# Patient Record
Sex: Female | Born: 1990 | Hispanic: No | Marital: Married | Smoking: Never smoker
Health system: Southern US, Community
[De-identification: ages and names within clinical notes are randomized; demographics above are authoritative.]

## PROBLEM LIST (undated history)

## (undated) DIAGNOSIS — D649 Anemia, unspecified: Secondary | ICD-10-CM

## (undated) DIAGNOSIS — Z789 Other specified health status: Secondary | ICD-10-CM

## (undated) DIAGNOSIS — A749 Chlamydial infection, unspecified: Secondary | ICD-10-CM

## (undated) DIAGNOSIS — Z8489 Family history of other specified conditions: Secondary | ICD-10-CM

## (undated) DIAGNOSIS — E119 Type 2 diabetes mellitus without complications: Secondary | ICD-10-CM

## (undated) DIAGNOSIS — D134 Benign neoplasm of liver: Secondary | ICD-10-CM

## (undated) DIAGNOSIS — N750 Cyst of Bartholin's gland: Secondary | ICD-10-CM

## (undated) HISTORY — DX: Benign neoplasm of liver: D13.4

---
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
## 2011-01-06 DIAGNOSIS — A749 Chlamydial infection, unspecified: Secondary | ICD-10-CM

## 2011-01-06 HISTORY — DX: Chlamydial infection, unspecified: A74.9

## 2011-02-11 ENCOUNTER — Inpatient Hospital Stay (HOSPITAL_COMMUNITY): Payer: Medicaid Other

## 2011-02-11 ENCOUNTER — Encounter (HOSPITAL_COMMUNITY): Payer: Self-pay

## 2011-02-11 ENCOUNTER — Encounter (HOSPITAL_COMMUNITY): Payer: Self-pay | Admitting: Anesthesiology

## 2011-02-11 ENCOUNTER — Inpatient Hospital Stay (HOSPITAL_COMMUNITY)
Admission: RE | Admit: 2011-02-11 | Discharge: 2011-02-14 | DRG: 774 | Disposition: A | Discharge status: Home / Self Care | Payer: Medicaid Other | Source: Ambulatory Visit | Attending: Obstetrics and Gynecology | Admitting: Obstetrics and Gynecology

## 2011-02-11 ENCOUNTER — Inpatient Hospital Stay (HOSPITAL_COMMUNITY): Payer: Medicaid Other | Admitting: Anesthesiology

## 2011-02-11 DIAGNOSIS — O98319 Other infections with a predominantly sexual mode of transmission complicating pregnancy, unspecified trimester: Secondary | ICD-10-CM

## 2011-02-11 DIAGNOSIS — O99892 Other specified diseases and conditions complicating childbirth: Secondary | ICD-10-CM | POA: Diagnosis present

## 2011-02-11 DIAGNOSIS — O9903 Anemia complicating the puerperium: Secondary | ICD-10-CM | POA: Diagnosis not present

## 2011-02-11 DIAGNOSIS — D649 Anemia, unspecified: Secondary | ICD-10-CM | POA: Diagnosis not present

## 2011-02-11 DIAGNOSIS — O411090 Infection of amniotic sac and membranes, unspecified, unspecified trimester, not applicable or unspecified: Secondary | ICD-10-CM | POA: Diagnosis present

## 2011-02-11 DIAGNOSIS — IMO0001 Reserved for inherently not codable concepts without codable children: Secondary | ICD-10-CM

## 2011-02-11 DIAGNOSIS — A568 Sexually transmitted chlamydial infection of other sites: Secondary | ICD-10-CM

## 2011-02-11 DIAGNOSIS — O99820 Streptococcus B carrier state complicating pregnancy: Secondary | ICD-10-CM

## 2011-02-11 DIAGNOSIS — O364XX0 Maternal care for intrauterine death, not applicable or unspecified: Principal | ICD-10-CM | POA: Diagnosis present

## 2011-02-11 DIAGNOSIS — O411290 Chorioamnionitis, unspecified trimester, not applicable or unspecified: Secondary | ICD-10-CM | POA: Diagnosis present

## 2011-02-11 DIAGNOSIS — Z22330 Carrier of Group B streptococcus: Secondary | ICD-10-CM

## 2011-02-11 DIAGNOSIS — IMO0002 Reserved for concepts with insufficient information to code with codable children: Secondary | ICD-10-CM | POA: Diagnosis present

## 2011-02-11 HISTORY — DX: Chlamydial infection, unspecified: A74.9

## 2011-02-11 HISTORY — DX: Cyst of Bartholin's gland: N75.0

## 2011-02-11 HISTORY — DX: Other specified health status: Z78.9

## 2011-02-11 LAB — DIFFERENTIAL
Basophils Relative: 0 % (ref 0–1)
Eosinophils Relative: 0 % (ref 0–5)
Lymphocytes Relative: 3 % — ABNORMAL LOW (ref 12–46)
Monocytes Relative: 7 % (ref 3–12)
Neutrophils Relative %: 90 % — ABNORMAL HIGH (ref 43–77)

## 2011-02-11 LAB — CBC
Hemoglobin: 9.9 g/dL — ABNORMAL LOW (ref 12.0–15.0)
Platelets: 192 10*3/uL (ref 150–400)
RBC: 3.93 MIL/uL (ref 3.87–5.11)
WBC: 20.2 10*3/uL — ABNORMAL HIGH (ref 4.0–10.5)

## 2011-02-11 LAB — COMPREHENSIVE METABOLIC PANEL
ALT: 11 U/L (ref 0–35)
Albumin: 2.8 g/dL — ABNORMAL LOW (ref 3.5–5.2)
Alkaline Phosphatase: 226 U/L — ABNORMAL HIGH (ref 39–117)
BUN: 7 mg/dL (ref 6–23)
Chloride: 102 mEq/L (ref 96–112)
GFR calc Af Amer: >90 mL/min (ref >90)
Glucose, Bld: 131 mg/dL — ABNORMAL HIGH (ref 70–99)
Potassium: 3.6 mEq/L (ref 3.5–5.1)
Sodium: 132 mEq/L — ABNORMAL LOW (ref 135–145)
Total Bilirubin: 0.3 mg/dL (ref 0.3–1.2)
Total Protein: 6.8 g/dL (ref 6.0–8.3)

## 2011-02-11 LAB — DIC (DISSEMINATED INTRAVASCULAR COAGULATION)PANEL
D-Dimer, Quant: 2.24 ug/mL-FEU — ABNORMAL HIGH (ref 0.00–0.48)
Fibrinogen: 619 mg/dL — ABNORMAL HIGH (ref 204–475)
INR: 1.15 (ref 0.00–1.49)
Platelets: 192 10*3/uL (ref 150–400)
Prothrombin Time: 14.9 seconds (ref 11.6–15.2)

## 2011-02-11 LAB — URIC ACID: Uric Acid, Serum: 4.3 mg/dL (ref 2.4–7.0)

## 2011-02-11 MED ORDER — PHENYLEPHRINE 40 MCG/ML (10ML) SYRINGE FOR IV PUSH (FOR BLOOD PRESSURE SUPPORT)
80.0000 ug | PREFILLED_SYRINGE | INTRAVENOUS | Status: DC | PRN
  Filled 2011-02-11: qty 5

## 2011-02-11 MED ORDER — IBUPROFEN 600 MG PO TABS: 600.0000 mg | ORAL_TABLET | Freq: Four times a day (QID) | ORAL | Status: DC | PRN

## 2011-02-11 MED ORDER — LACTATED RINGERS IV SOLN: 500.0000 mL | INTRAVENOUS | Status: DC | PRN

## 2011-02-11 MED ORDER — OXYTOCIN 20 UNITS IN LACTATED RINGERS INFUSION - SIMPLE
125.0000 mL/h | Freq: Once | INTRAVENOUS | Status: AC
  Administered 2011-02-12: 125 mL/h via INTRAVENOUS

## 2011-02-11 MED ORDER — OXYTOCIN BOLUS FROM INFUSION
500.0000 mL | Freq: Once | INTRAVENOUS | Status: DC
  Filled 2011-02-11: qty 1000
  Filled 2011-02-11: qty 500

## 2011-02-11 MED ORDER — OXYCODONE-ACETAMINOPHEN 5-325 MG PO TABS: 2.0000 | ORAL_TABLET | ORAL | Status: DC | PRN

## 2011-02-11 MED ORDER — BUTORPHANOL TARTRATE 2 MG/ML IJ SOLN: 1.0000 mg | INTRAMUSCULAR | Status: DC | PRN

## 2011-02-11 MED ORDER — ACETAMINOPHEN 325 MG PO TABS
650.0000 mg | ORAL_TABLET | ORAL | Status: DC | PRN
  Administered 2011-02-12: 650 mg via ORAL
  Filled 2011-02-11: qty 2

## 2011-02-11 MED ORDER — DIPHENHYDRAMINE HCL 50 MG/ML IJ SOLN: 12.5000 mg | INTRAMUSCULAR | Status: DC | PRN

## 2011-02-11 MED ORDER — LACTATED RINGERS IV SOLN
INTRAVENOUS | Status: DC
  Administered 2011-02-11 – 2011-02-12 (×4): via INTRAVENOUS

## 2011-02-11 MED ORDER — ONDANSETRON HCL 4 MG/2ML IJ SOLN: 4.0000 mg | Freq: Four times a day (QID) | INTRAMUSCULAR | Status: DC | PRN

## 2011-02-11 MED ORDER — LIDOCAINE HCL 1.5 % IJ SOLN
INTRAMUSCULAR | Status: DC | PRN
  Administered 2011-02-11: 2 mL via INTRADERMAL
  Administered 2011-02-11 (×2): 5 mL via INTRADERMAL

## 2011-02-11 MED ORDER — PHENYLEPHRINE 40 MCG/ML (10ML) SYRINGE FOR IV PUSH (FOR BLOOD PRESSURE SUPPORT): 80.0000 ug | PREFILLED_SYRINGE | INTRAVENOUS | Status: DC | PRN

## 2011-02-11 MED ORDER — EPHEDRINE 5 MG/ML INJ: 10.0000 mg | INTRAVENOUS | Status: DC | PRN

## 2011-02-11 MED ORDER — EPHEDRINE 5 MG/ML INJ
10.0000 mg | INTRAVENOUS | Status: DC | PRN
  Filled 2011-02-11: qty 4

## 2011-02-11 MED ORDER — FLEET ENEMA 7-19 GM/118ML RE ENEM: 1.0000 | ENEMA | RECTAL | Status: DC | PRN

## 2011-02-11 MED ORDER — LACTATED RINGERS IV SOLN: 500.0000 mL | Freq: Once | INTRAVENOUS | Status: DC

## 2011-02-11 MED ORDER — FENTANYL 2.5 MCG/ML BUPIVACAINE 1/10 % EPIDURAL INFUSION (WH - ANES)
14.0000 mL/h | INTRAMUSCULAR | Status: DC
  Administered 2011-02-11 – 2011-02-12 (×2): 14 mL/h via EPIDURAL
  Filled 2011-02-11 (×2): qty 60

## 2011-02-11 MED ORDER — LIDOCAINE HCL (PF) 1 % IJ SOLN
30.0000 mL | INTRAMUSCULAR | Status: DC | PRN
  Administered 2011-02-12: 30 mL via SUBCUTANEOUS
  Filled 2011-02-11: qty 30

## 2011-02-11 MED ORDER — CITRIC ACID-SODIUM CITRATE 334-500 MG/5ML PO SOLN: 30.0000 mL | ORAL | Status: DC | PRN

## 2011-02-11 MED ORDER — LACTATED RINGERS IV SOLN: INTRAVENOUS | Status: DC

## 2011-02-11 NOTE — Progress Notes (Signed)
  Subjective: Now on Birthing Suite--family and partner in room.  Had been requested to come to discuss questions regarding status and possible epidural plan.  Patient getting more uncomfortable.  Objective: BP 147/90  Pulse 138  Temp(Src) 98.1 F (36.7 C) (Oral)  Resp 18  Ht 5\' 3"  (1.6 m)  Wt 88.451 kg (195 lb)  BMI 34.54 kg/m2  SpO2 99%   UCs q 3 min, mod.  Labs: Lab Results  Component Value Date   WBC 20.2* 02/11/2011   HGB 9.9* 02/11/2011   HCT 30.7* 02/11/2011   MCV 78.1 02/11/2011   PLT 192 02/11/2011   PLT 192 02/11/2011    Assessment / Plan: IUFD at term, SROM, labor  Reviewed circumstances and questions with family.  Discussed epidural process.  Patient does wish to proceed with epidural.  Leeyah Heather 02/11/2011, 10:11 PM

## 2011-02-11 NOTE — Progress Notes (Signed)
2005 Report called to [facility] in [place]. Pt transported to 161 via w/c after going to [facility].

## 2011-02-11 NOTE — ED Notes (Signed)
Melanie Hoover PA into see patient portable ultrasound no fhr, ultrasound called to perform bedside US

## 2011-02-11 NOTE — H&P (Signed)
Melanie Mccoy is a 20 y.o. female, G1P0, at 31 3/7 weeks, presenting for SROM at 3:50pm, clear fluid initially, then "brownish", with UCs q2-4 since.  Reports feeling last FM this am.  Had NST 02/02/11 for decreased FM, reactive.  Seen 02/07/11 for last visit at [facility], no complications noted.  In MAU, no FHTs were heard--STAT bedside US showed IUFD.  SROM was verified, with moderate MSF noted.  Cervix was 4 cm, 90%, vtx 0 station by Dr. Redmond Baseman exam.  Contracting q 3-5 min, moderate.  Patient is therefore to be admitted for labor.  Pregnancy remarkable for: Recent dx chlamydia, with treatment 01/17/11--no TOC yet Positive GBS Slight late to care at 15 weeks  History of current pregnancy: Entered care at 15 weeks.  Had normal quad screen.  Ultrasound for anatomy at 20 weeks, with normal findings.  Treated for UTI at 24 weeks, with subsequent positive culture of GBS.  Declined flu vaccine.  Positive chlamydia at 36 weeks, with treatment.  TOC scheduled for upcoming visit.  Had NST last week for decreased FM, reactive.  Had last visit 02/07/11 without issue.  Cervix was 2 cm, 70%, vtx 0 station.   History OB History    Grav Para Term Preterm Abortions TAB SAB Ect Mult Living   2              Past Medical History  Diagnosis Date  . No pertinent past medical history   Chlamydia 11/12.  Hx pilonidal cyst  No past surgical history on file.  I&D of pilonidal cyst 2005  Family History: family history is not on file. MGM sarcoidosis.  PGM breast  Social History:  reports that she has never smoked. She does not have any smokeless tobacco history on file. Her alcohol and drug histories not on file.  Single, FOB is Alphonsus Sias, not currently present with patient.  Patient is biracial, of the [place] faith.  High school educated, employed at [employer] part-time.    ROS:  Leaking MSF, contractions q 2-4 min.  Denies HA, visual symptoms, epigastric pain.  Dilation: 4 Effacement (%):  90 Station: 0 Exam by:: Dr Normand Sloop Blood pressure 129/71, pulse 129, temperature 99.6 F (37.6 C), temperature source Oral, resp. rate 16, SpO2 99.00%.  Exam Chest clear Heart RRR without murmur Abd gravid, NT, moderate contractions palpated Pelvic--leaking moderate MSF, cervix 4 cm, 90%, vtx, 0 station by Dr. Normand Sloop Ext WNL, DTR 2+ without clonus, 1+ edema  Physical Exam  Prenatal labs: ABO, Rh: A/Positive/-- (06/06 0000) Antibody:  Neg Rubella: Immune (06/06 0000) RPR: Nonreactive (06/06 0000)  HBsAg: Negative (06/06 0000)  HIV: Non-reactive (06/06 0000)  GBS: Positive (08/15 0000)  GC/chlamydia negative at NOB, chlamydia positive at 36 weeks. Quad screen negative Glucola WNL Hgb 11.9 at NOB/11 at  28 weeks.  Assessment/Plan: IUP at 40 3/7 weeks IUFD Active labor with SROM at 3:50pm, moderate MSF Positive GBS Recent chlamydia, with treatment 01/17/11  Plan: Admit to Birthing Suite per consult with Dr. Mable Fill saw the patient and family in MAU. Routine MD orders Pain medication prn. CBC, RPR, DIC panel, PIH labs, TORCH titers TOC urine for chlamydia Support to patient and family for loss Family desires autopsy  Nigel Bridgeman 02/11/2011, 7:48 PM

## 2011-02-11 NOTE — Anesthesia Preprocedure Evaluation (Signed)
Anesthesia Evaluation  Patient identified by MRN, date of birth, ID band Patient awake    Reviewed: Allergy & Precautions, H&P , NPO status , Patient's Chart, lab work & pertinent test results, reviewed documented beta blocker date and time   History of Anesthesia Complications Negative for: history of anesthetic complications  Airway Mallampati: II TM Distance: >3 FB Neck ROM: full    Dental  (+) Teeth Intact   Pulmonary neg pulmonary ROS,  clear to auscultation        Cardiovascular neg cardio ROS regular Normal    Neuro/Psych Negative Neurological ROS  Negative Psych ROS   GI/Hepatic negative GI ROS, Neg liver ROS,   Endo/Other  Negative Endocrine ROS  Renal/GU negative Renal ROS     Musculoskeletal   Abdominal   Peds  Hematology negative hematology ROS (+)   Anesthesia Other Findings   Reproductive/Obstetrics (+) Pregnancy (IUFD at 40 weeks)                           Anesthesia Physical Anesthesia Plan  ASA: II  Anesthesia Plan: Epidural   Post-op Pain Management:    Induction:   Airway Management Planned:   Additional Equipment:   Intra-op Plan:   Post-operative Plan:   Informed Consent: I have reviewed the patients History and Physical, chart, labs and discussed the procedure including the risks, benefits and alternatives for the proposed anesthesia with the patient or authorized representative who has indicated his/her understanding and acceptance.     Plan Discussed with:   Anesthesia Plan Comments:         Anesthesia Quick Evaluation

## 2011-02-11 NOTE — Anesthesia Procedure Notes (Signed)
Epidural Patient location during procedure: OB Start time: 02/11/2011 10:56 PM Reason for block: procedure for pain  Staffing Performed by: anesthesiologist   Preanesthetic Checklist Completed: patient identified, site marked, surgical consent, pre-op evaluation, timeout performed, IV checked, risks and benefits discussed and monitors and equipment checked  Epidural Patient position: sitting Prep: site prepped and draped and DuraPrep Patient monitoring: continuous pulse ox and blood pressure Approach: midline Injection technique: LOR air  Needle:  Needle type: Tuohy  Needle gauge: 17 G Needle length: 9 cm Needle insertion depth: 5 cm cm Catheter type: closed end flexible Catheter size: 19 Gauge Catheter at skin depth: 10 cm Test dose: negative  Assessment Events: blood not aspirated, injection not painful, no injection resistance, negative IV test and no paresthesia  Additional Notes Discussed risk of headache, infection, bleeding, nerve injury and failed or incomplete block.  Patient voices understanding and wishes to proceed.

## 2011-02-11 NOTE — ED Notes (Signed)
Offered emotional support to patient and her mother, bedside report given to Quintella Baton RN

## 2011-02-11 NOTE — ED Notes (Signed)
[place] into perform bedside ultrasound

## 2011-02-11 NOTE — ED Notes (Signed)
[place] CNM in to see pt

## 2011-02-11 NOTE — Progress Notes (Signed)
Melanie Mccoy   Subjective:pt complains of pain from contractions   Objective: BP 129/71  Pulse 129  Temp(Src) 99.6 F (37.6 C) (Oral)  Resp 16  SpO2 99%      FHT:  none UC:   regular, every 2-3 minutes SVE:   Dilation: 4 Effacement (%): 90 Station: 0 Exam by:: Dr Normand Sloop [name] CNM in with MD )  Labs: Lab Results  Component Value Date   WBC 20.2* 02/11/2011   HGB 9.9* 02/11/2011   HCT 30.7* 02/11/2011   MCV 78.1 02/11/2011   PLT 192 02/11/2011   PLT 192 02/11/2011    Assessment / Plan: IUFD with SROM unkown cause  Labor: Progressing normally Fetal Wellbeing:  na Anticipated MOD:  vaginal delivery.  pt may have IV pain meds or epidural for pain  Melanie Mccoy A 02/11/2011, 7:59 PM

## 2011-02-12 ENCOUNTER — Encounter (HOSPITAL_COMMUNITY): Payer: Self-pay | Admitting: *Deleted

## 2011-02-12 ENCOUNTER — Other Ambulatory Visit: Payer: Self-pay | Admitting: Obstetrics and Gynecology

## 2011-02-12 DIAGNOSIS — IMO0002 Reserved for concepts with insufficient information to code with codable children: Secondary | ICD-10-CM | POA: Diagnosis present

## 2011-02-12 DIAGNOSIS — O411290 Chorioamnionitis, unspecified trimester, not applicable or unspecified: Secondary | ICD-10-CM | POA: Diagnosis present

## 2011-02-12 DIAGNOSIS — IMO0001 Reserved for inherently not codable concepts without codable children: Secondary | ICD-10-CM

## 2011-02-12 LAB — CBC
Hemoglobin: 7.9 g/dL — ABNORMAL LOW (ref 12.0–15.0)
RBC: 3.09 MIL/uL — ABNORMAL LOW (ref 3.87–5.11)
WBC: 26.7 10*3/uL — ABNORMAL HIGH (ref 4.0–10.5)

## 2011-02-12 LAB — RAPID URINE DRUG SCREEN, HOSP PERFORMED
Amphetamines: NOT DETECTED
Benzodiazepines: NOT DETECTED
Cocaine: NOT DETECTED
Opiates: NOT DETECTED

## 2011-02-12 LAB — GC/CHLAMYDIA PROBE AMP, URINE: Chlamydia, Swab/Urine, PCR: NEGATIVE

## 2011-02-12 MED ORDER — FERROUS SULFATE 325 (65 FE) MG PO TABS
325.0000 mg | ORAL_TABLET | Freq: Two times a day (BID) | ORAL | Status: DC
Start: 2011-02-12 — End: 2011-02-12
  Administered 2011-02-12: 325 mg via ORAL
  Filled 2011-02-12: qty 1

## 2011-02-12 MED ORDER — FERROUS SULFATE 300 (60 FE) MG/5ML PO SYRP
300.0000 mg | ORAL_SOLUTION | Freq: Two times a day (BID) | ORAL | Status: DC
  Administered 2011-02-12 – 2011-02-14 (×5): 300 mg via ORAL
  Filled 2011-02-12 (×7): qty 5

## 2011-02-12 MED ORDER — DIPHENHYDRAMINE HCL 25 MG PO CAPS: 25.0000 mg | ORAL_CAPSULE | Freq: Four times a day (QID) | ORAL | Status: DC | PRN

## 2011-02-12 MED ORDER — ZOLPIDEM TARTRATE 5 MG PO TABS: 5.0000 mg | ORAL_TABLET | Freq: Every evening | ORAL | Status: DC | PRN

## 2011-02-12 MED ORDER — DIBUCAINE 1 % RE OINT: 1.0000 "application " | TOPICAL_OINTMENT | RECTAL | Status: DC | PRN

## 2011-02-12 MED ORDER — SENNOSIDES-DOCUSATE SODIUM 8.6-50 MG PO TABS
2.0000 | ORAL_TABLET | Freq: Every day | ORAL | Status: DC
  Administered 2011-02-12 – 2011-02-13 (×2): 2 via ORAL

## 2011-02-12 MED ORDER — SODIUM CHLORIDE 0.9 % IV SOLN
3.0000 g | Freq: Four times a day (QID) | INTRAVENOUS | Status: AC
  Administered 2011-02-12 – 2011-02-13 (×4): 3 g via INTRAVENOUS
  Filled 2011-02-12 (×4): qty 3

## 2011-02-12 MED ORDER — PRENATAL PLUS 27-1 MG PO TABS: 1.0000 | ORAL_TABLET | Freq: Every day | ORAL | Status: DC

## 2011-02-12 MED ORDER — ONDANSETRON HCL 4 MG/2ML IJ SOLN: 4.0000 mg | INTRAMUSCULAR | Status: DC | PRN

## 2011-02-12 MED ORDER — ONDANSETRON HCL 4 MG PO TABS: 4.0000 mg | ORAL_TABLET | ORAL | Status: DC | PRN

## 2011-02-12 MED ORDER — WITCH HAZEL-GLYCERIN EX PADS: 1.0000 "application " | MEDICATED_PAD | CUTANEOUS | Status: DC | PRN

## 2011-02-12 MED ORDER — IBUPROFEN 600 MG PO TABS: 600.0000 mg | ORAL_TABLET | Freq: Four times a day (QID) | ORAL | Status: DC

## 2011-02-12 MED ORDER — TETANUS-DIPHTH-ACELL PERTUSSIS 5-2.5-18.5 LF-MCG/0.5 IM SUSP
0.5000 mL | Freq: Once | INTRAMUSCULAR | Status: DC
  Filled 2011-02-12: qty 0.5

## 2011-02-12 MED ORDER — OXYCODONE-ACETAMINOPHEN 5-325 MG PO TABS: 1.0000 | ORAL_TABLET | ORAL | Status: DC | PRN

## 2011-02-12 MED ORDER — BENZOCAINE-MENTHOL 20-0.5 % EX AERO
INHALATION_SPRAY | CUTANEOUS | Status: AC
  Administered 2011-02-12: 08:00:00
  Filled 2011-02-12: qty 56

## 2011-02-12 MED ORDER — LANOLIN HYDROUS EX OINT: TOPICAL_OINTMENT | CUTANEOUS | Status: DC | PRN

## 2011-02-12 MED ORDER — SIMETHICONE 80 MG PO CHEW: 80.0000 mg | CHEWABLE_TABLET | ORAL | Status: DC | PRN

## 2011-02-12 MED ORDER — COMPLETENATE 29-1 MG PO CHEW
1.0000 | CHEWABLE_TABLET | Freq: Every day | ORAL | Status: DC
  Administered 2011-02-12 – 2011-02-14 (×3): 1 via ORAL
  Filled 2011-02-12 (×4): qty 1

## 2011-02-12 MED ORDER — BENZOCAINE-MENTHOL 20-0.5 % EX AERO
1.0000 "application " | INHALATION_SPRAY | CUTANEOUS | Status: DC | PRN
  Administered 2011-02-12: 1 via TOPICAL

## 2011-02-12 MED ORDER — IBUPROFEN 100 MG/5ML PO SUSP
600.0000 mg | Freq: Four times a day (QID) | ORAL | Status: DC
  Administered 2011-02-12 – 2011-02-14 (×9): 600 mg via ORAL
  Filled 2011-02-12 (×14): qty 30

## 2011-02-12 NOTE — Anesthesia Postprocedure Evaluation (Signed)
  Anesthesia Post-op Note  Patient: Melanie Mccoy  Procedure(s) Performed: * No procedures listed *  Patient Location: Women's Unit  Anesthesia Type: Epidural  Level of Consciousness: awake  Airway and Oxygen Therapy: Patient Spontanous Breathing  Post-op Pain: mild  Post-op Assessment: Patient's Cardiovascular Status Stable, Respiratory Function Stable, Patent Airway, No signs of Nausea or vomiting and Pain level controlled  Post-op Vital Signs: stable  Complications: No apparent anesthesia complications
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
## 2011-02-14 LAB — DIFFERENTIAL
Eosinophils Absolute: 0.1 10*3/uL (ref 0.0–0.7)
Eosinophils Relative: 1 % (ref 0–5)
Lymphs Abs: 2.1 10*3/uL (ref 0.7–4.0)
Monocytes Absolute: 0.8 10*3/uL (ref 0.1–1.0)
Monocytes Relative: 5 % (ref 3–12)

## 2011-02-14 LAB — GC/CHLAMYDIA PROBE AMP, GENITAL: Chlamydia, DNA Probe: NEGATIVE

## 2011-02-14 LAB — CBC
Hemoglobin: 7.2 g/dL — ABNORMAL LOW (ref 12.0–15.0)
MCH: 25.1 pg — ABNORMAL LOW (ref 26.0–34.0)
MCV: 79.8 fL (ref 78.0–100.0)
RBC: 2.87 MIL/uL — ABNORMAL LOW (ref 3.87–5.11)

## 2011-02-14 LAB — LUPUS ANTICOAGULANT PANEL
PTT Lupus Anticoagulant: 46.8 secs — ABNORMAL HIGH (ref 28.0–43.0)
PTTLA Confirmation: 11.7 secs — ABNORMAL HIGH (ref <8.0)

## 2011-02-14 MED ORDER — IBUPROFEN 100 MG/5ML PO SUSP: 600.0000 mg | Freq: Four times a day (QID) | ORAL | Status: DC

## 2011-02-14 NOTE — Progress Notes (Signed)
Spiritual Care - Provided support to patient and her mother after full term loss.  Patient's mother is assisting her in plans for [employer] and burial.  [facility] will assist.  Patient's mother asked for information regarding options for service and we discussed at length.  Also, referred her to the pamphlet, "When Hello Means Goodbye" which contains information regarding services.    Reviewed other resources available through the [employer] program and [employer].  Patient has [employer] bereavement packet.  Assured them of availability to offer further assistance as needed.  Dory Horn, Chaplain

## 2011-02-14 NOTE — Discharge Summary (Signed)
Obstetric Discharge Summary Reason for Admission: onset of labor, IUFD Prenatal Procedures: NST and ultrasound Intrapartum Procedures: spontaneous vaginal delivery Postpartum Procedures: none Complications-Operative and Postpartum: 2nd degree Hemoglobin  Date Value Range Status  02/14/2011 7.2* 12.0-15.0 (g/dL) Final     HCT  Date Value Range Status  02/14/2011 22.9* 36.0-46.0 (%) Final   Results for orders placed during the hospital encounter of 02/11/11 (from the past 72 hour(s))  CBC     Status: Abnormal   Collection Time   02/11/11  7:02 PM      Component Value Range Comment   WBC 20.2 (*) 4.0 - 10.5 (K/uL)    RBC 3.93  3.87 - 5.11 (MIL/uL)    Hemoglobin 9.9 (*) 12.0 - 15.0 (g/dL)    HCT 40.9 (*) 81.1 - 46.0 (%)    MCV 78.1  78.0 - 100.0 (fL)    MCH 25.2 (*) 26.0 - 34.0 (pg)    MCHC 32.2  30.0 - 36.0 (g/dL)    RDW 91.4  78.2 - 95.6 (%)    Platelets 192  150 - 400 (K/uL)   DIFFERENTIAL     Status: Abnormal   Collection Time   02/11/11  7:02 PM      Component Value Range Comment   Neutrophils Relative 90 (*) 43 - 77 (%)    Lymphocytes Relative 3 (*) 12 - 46 (%)    Monocytes Relative 7  3 - 12 (%)    Eosinophils Relative 0  0 - 5 (%)    Basophils Relative 0  0 - 1 (%)    Neutro Abs 18.2 (*) 1.7 - 7.7 (K/uL)    Lymphs Abs 0.6 (*) 0.7 - 4.0 (K/uL)    Monocytes Absolute 1.4 (*) 0.1 - 1.0 (K/uL)    Eosinophils Absolute 0.0  0.0 - 0.7 (K/uL)    Basophils Absolute 0.0  0.0 - 0.1 (K/uL)   COMPREHENSIVE METABOLIC PANEL     Status: Abnormal   Collection Time   02/11/11  7:02 PM      Component Value Range Comment   Sodium 132 (*) 135 - 145 (mEq/L)    Potassium 3.6  3.5 - 5.1 (mEq/L)    Chloride 102  96 - 112 (mEq/L)    CO2 20  19 - 32 (mEq/L)    Glucose, Bld 131 (*) 70 - 99 (mg/dL)    BUN 7  6 - 23 (mg/dL)    Creatinine, Ser 2.13  0.50 - 1.10 (mg/dL)    Calcium 9.0  8.4 - 10.5 (mg/dL)    Total Protein 6.8  6.0 - 8.3 (g/dL)    Albumin 2.8 (*) 3.5 - 5.2 (g/dL)    AST 16  0  - 37 (U/L)    ALT 11  0 - 35 (U/L)    Alkaline Phosphatase 226 (*) 39 - 117 (U/L)    Total Bilirubin 0.3  0.3 - 1.2 (mg/dL)    GFR calc non Af Amer >90  >90 (mL/min)    GFR calc Af Amer >90  >90 (mL/min)   URIC ACID     Status: Normal   Collection Time   02/11/11  7:02 PM      Component Value Range Comment   Uric Acid, Serum 4.3  2.4 - 7.0 (mg/dL)   LACTATE DEHYDROGENASE     Status: Normal   Collection Time   02/11/11  7:02 PM      Component Value Range Comment   LD 189  94 -  250 (U/L)   RPR     Status: Normal   Collection Time   02/11/11  7:02 PM      Component Value Range Comment   RPR NON REACTIVE  NON REACTIVE    DIC PANEL     Status: Abnormal   Collection Time   02/11/11  7:02 PM      Component Value Range Comment   Prothrombin Time 14.9  11.6 - 15.2 (seconds)    INR 1.15  0.00 - 1.49     aPTT 33  24 - 37 (seconds)    Fibrinogen 619 (*) 204 - 475 (mg/dL)    D-Dimer, Quant 4.09 (*) 0.00 - 0.48 (ug/mL-FEU)    Platelets 192  150 - 400 (K/uL)    Smear Review NO SCHISTOCYTES SEEN     GC/CHLAMYDIA PROBE AMP, URINE     Status: Normal   Collection Time   02/11/11  8:42 PM      Component Value Range Comment   GC Probe Amp, Urine NEGATIVE  NEGATIVE     Chlamydia, Swab/Urine, PCR NEGATIVE  NEGATIVE    TISSUE CULTURE     Status: Normal (Preliminary result)   Collection Time   02/12/11  5:00 AM      Component Value Range Comment   Specimen Description PLACENTA      Special Requests Normal      Gram Stain        Value: MODERATE WBC PRESENT, PREDOMINANTLY PMN     NO SQUAMOUS EPITHELIAL CELLS SEEN     NO ORGANISMS SEEN   Culture        Value: RARE STAPHYLOCOCCUS SPECIES (COAGULASE NEGATIVE)     Note: RIFAMPIN AND GENTAMICIN SHOULD NOT BE USED AS SINGLE DRUGS FOR TREATMENT OF STAPH INFECTIONS.   Report Status PENDING     ANAEROBIC CULTURE     Status: Normal (Preliminary result)   Collection Time   02/12/11  5:10 AM      Component Value Range Comment   Specimen Description  PLACENTA      Special Requests Normal      Gram Stain        Value: NO WBC SEEN     NO SQUAMOUS EPITHELIAL CELLS SEEN     MODERATE GRAM NEGATIVE RODS   Culture        Value: NO ANAEROBES ISOLATED; CULTURE IN PROGRESS FOR 5 DAYS   Report Status PENDING     CBC     Status: Abnormal   Collection Time   02/12/11  6:30 AM      Component Value Range Comment   WBC 26.7 (*) 4.0 - 10.5 (K/uL)    RBC 3.09 (*) 3.87 - 5.11 (MIL/uL)    Hemoglobin 7.9 (*) 12.0 - 15.0 (g/dL)    HCT 81.1 (*) 91.4 - 46.0 (%)    MCV 77.7 (*) 78.0 - 100.0 (fL)    MCH 25.6 (*) 26.0 - 34.0 (pg)    MCHC 32.9  30.0 - 36.0 (g/dL)    RDW 78.2  95.6 - 21.3 (%)    Platelets 184  150 - 400 (K/uL)   URINE RAPID DRUG SCREEN (HOSP PERFORMED)     Status: Normal   Collection Time   02/12/11  2:00 PM      Component Value Range Comment   Opiates NONE DETECTED  NONE DETECTED     Cocaine NONE DETECTED  NONE DETECTED     Benzodiazepines NONE DETECTED  NONE DETECTED  Amphetamines NONE DETECTED  NONE DETECTED     Tetrahydrocannabinol NONE DETECTED  NONE DETECTED     Barbiturates NONE DETECTED  NONE DETECTED    ANA     Status: Normal   Collection Time   02/13/11  5:18 AM      Component Value Range Comment   ANA NEGATIVE  NEGATIVE    CBC     Status: Abnormal   Collection Time   02/14/11  6:49 AM      Component Value Range Comment   WBC 15.6 (*) 4.0 - 10.5 (K/uL)    RBC 2.87 (*) 3.87 - 5.11 (MIL/uL)    Hemoglobin 7.2 (*) 12.0 - 15.0 (g/dL)    HCT 16.1 (*) 09.6 - 46.0 (%)    MCV 79.8  78.0 - 100.0 (fL)    MCH 25.1 (*) 26.0 - 34.0 (pg)    MCHC 31.4  30.0 - 36.0 (g/dL)    RDW 04.5  40.9 - 81.1 (%)    Platelets 213  150 - 400 (K/uL)   DIFFERENTIAL     Status: Abnormal   Collection Time   02/14/11  6:49 AM      Component Value Range Comment   Neutrophils Relative 80 (*) 43 - 77 (%)    Neutro Abs 12.5 (*) 1.7 - 7.7 (K/uL)    Lymphocytes Relative 13  12 - 46 (%)    Lymphs Abs 2.1  0.7 - 4.0 (K/uL)    Monocytes Relative 5  3  - 12 (%)    Monocytes Absolute 0.8  0.1 - 1.0 (K/uL)    Eosinophils Relative 1  0 - 5 (%)    Eosinophils Absolute 0.1  0.0 - 0.7 (K/uL)    Basophils Relative 0  0 - 1 (%)    Basophils Absolute 0.1  0.0 - 0.1 (K/uL)    Hospital Course: Admitted in labor on 02/11/11, with SROM at 3:30pm.  Unable to find FHR on admission, with stat bedside US done, confirming IUFD.  Patient was 4 cm at arrival, with MSF noted.  Labored spontaneously, with epidural placed.  Ran low grade fever during labor, with max of 101 just before delivery.  See delivery note for further information.  No obvious anomalies noted, but chorioamnionitis suspected due to foul smell to fluid, fetus, and placenta.  Placenta to lab, autopsy planned.  All lab values for IUFD work-up were negative.  Leukocytosis noted pp, but was decreasing by day 2 pp.  Positive GBS.  Delivery was performed by Nigel Bridgeman, CNM, with Dr. Normand Sloop present, without complication. Patient tolerated the procedure without difficulty, with  2nd degree laceration noted.  Mother had an uncomplicated postpartum course, with anemia, but no hemodynamic instability. Mom's physical exam was WNL, and she was discharged home in stable condition. Contraception plan was undecided at present.  She received adequate benefit from po pain medications, and was taking liquid forms of all meds.  She had a social work consult, and good family support. I will see her in follow-up in 2-3 weeks.    Discharge Diagnoses: Term Pregnancy-delivered and IUFD, anemia without hemodynamic instability  Discharge Information: Date: 02/14/2011 Activity: pelvic rest and Per [facility] handout Diet: routine Medications: Ibuprofen and Iron (continue Floradix iron elixir) Condition: stable Instructions: refer to practice specific booklet Discharge to: home Follow-up Information    Follow up with Nigel Bridgeman, CNM in 3 weeks. (Call to make appointment with Chip Boer L. for 2-3 weeks, or as needed at [facility].)  Contact information:   [address] [place] [address] [phone]          Newborn Data: IUFD at term Birth Weight: 7 lb 12.5 oz (3530 g) APGAR: 0, 0   Gracy Ehly 02/14/2011, 11:57 AM

## 2011-02-14 NOTE — Progress Notes (Signed)
UR chart review completed.  

## 2011-02-14 NOTE — Progress Notes (Signed)
Post Partum Day 1 Subjective: up ad lib, voiding, tolerating PO, + flatus and VB lighter; No chills, sweats, or fever.  No dizziness when up.  Mother and grandmother at [facility].  Support given.  On po iron supplement.  Pt has finished 24 hrs of Unasyn IV.  RN does report confidentially pt has had no "alone time," and not seen pt cry any.  Objective: Blood pressure 135/84, pulse 99, temperature 97.7 F (36.5 C), temperature source Oral, resp. rate 18, height 5\' 3"  (1.6 m), weight 88.451 kg (195 lb), last menstrual period 05/04/2010, SpO2 97.00%.  Physical Exam:  General: alert, cooperative and no distress Lochia: appropriate, rubra Uterine Fundus: firm; u/-1 to -2 Incision: n/a DVT Evaluation: No evidence of DVT seen on physical exam. Negative Homan's sign. No significant calf/ankle edema.   Basename 02/14/11 0649 02/12/11 0630  HGB 7.2* 7.9*  HCT 22.9* 24.0*    Assessment/Plan: Plan for discharge tomorrow. Leukocytosis; Anemia w/o s/s.  S/p term IUFD.  HR tachycardic at times PP.  Afebrile. Clotting times WNL.  D-dimer and Fibrinogen elevated, but per c/w dr. Normand Sloop, can be elevated r/t to pregnancy. Other cultures and labs still pending. Will repeat CBC in AM.  Consider po Abx therapy if fever spikes.  Enc'd pt to continue adequate H2O. Support given r/e loss.    LOS: 3 days   Evie Croston H 02/14/2011, 10:20 AM

## 2011-02-15 LAB — TISSUE CULTURE

## 2011-02-19 LAB — ANAEROBIC CULTURE: Special Requests: NORMAL

## 2011-02-21 LAB — TORCH-IGM(TOXO/ RUB/ CMV/ HSV) W TITER
CMV IgM: 0.3
RPR Screen: NONREACTIVE
Rubella IgM Index: <0.9

## 2011-03-06 ENCOUNTER — Inpatient Hospital Stay (HOSPITAL_COMMUNITY): Admission: AD | Admit: 2011-03-06 | Payer: Self-pay | Source: Ambulatory Visit | Admitting: Obstetrics and Gynecology

## 2011-03-08 DEATH — deceased

## 2011-07-15 ENCOUNTER — Telehealth: Payer: Self-pay | Admitting: Obstetrics and Gynecology

## 2011-07-15 NOTE — Telephone Encounter (Signed)
Melanie Mccoy received

## 2011-07-21 ENCOUNTER — Telehealth: Payer: Self-pay

## 2011-07-21 NOTE — Telephone Encounter (Signed)
Spoke with pt c/o no relief with Loestrin spotting not subsiding & requests stronger BC med. Consulted with VL. Orthocyclen 1 po qd x 1 yr rf called to RA . Pt aware & agrees.

## 2012-05-25 ENCOUNTER — Other Ambulatory Visit: Payer: Self-pay | Admitting: Certified Nurse Midwife

## 2012-05-25 LAB — OB RESULTS CONSOLE HIV ANTIBODY (ROUTINE TESTING): HIV: NONREACTIVE

## 2012-05-27 LAB — CULTURE, OB URINE: Organism ID, Bacteria: NO GROWTH

## 2012-05-28 LAB — PRENATAL PANEL VII
Antibody Screen: NEGATIVE
Basophils Relative: 1 % (ref 0–1)
Eosinophils Absolute: 0.1 10*3/uL (ref 0.0–0.7)
Eosinophils Relative: 1 % (ref 0–5)
Hemoglobin: 13.2 g/dL (ref 12.0–15.0)
Hepatitis B Surface Ag: NEGATIVE
MCH: 29.4 pg (ref 26.0–34.0)
MCHC: 34.3 g/dL (ref 30.0–36.0)
MCV: 85.7 fL (ref 78.0–100.0)
Monocytes Relative: 5 % (ref 3–12)
Neutrophils Relative %: 77 % (ref 43–77)
Rh Type: POSITIVE
Rubella: 17.9 Index — ABNORMAL HIGH (ref <0.90)

## 2012-05-28 LAB — GC/CHLAMYDIA PROBE AMP, URINE: Chlamydia, Swab/Urine, PCR: NEGATIVE

## 2012-05-29 LAB — HEMOGLOBINOPATHY EVALUATION
Hemoglobin Other: 0 %
Hgb A2 Quant: 2.6 % (ref 2.2–3.2)
Hgb A: 97.4 % (ref 96.8–97.8)

## 2012-09-26 ENCOUNTER — Encounter: Payer: Self-pay | Admitting: *Deleted

## 2012-09-26 ENCOUNTER — Encounter: Payer: BC Managed Care – PPO | Attending: Obstetrics and Gynecology | Admitting: *Deleted

## 2012-09-26 VITALS — Ht 63.0 in | Wt 196.5 lb

## 2012-09-26 DIAGNOSIS — O99810 Abnormal glucose complicating pregnancy: Secondary | ICD-10-CM

## 2012-09-26 DIAGNOSIS — Z713 Dietary counseling and surveillance: Secondary | ICD-10-CM | POA: Insufficient documentation

## 2012-09-26 NOTE — Patient Instructions (Signed)
Goals:  Check glucose levels per MD as instructed  Follow Gestational Diabetes Diet as instructed  Call for follow-up as needed    

## 2012-09-26 NOTE — Progress Notes (Signed)
  Patient was seen on 09-26-12 for Gestational Diabetes self-management class at [facility]. The following learning objectives were met by the patient during this course:   States the definition of Gestational Diabetes  States why dietary management is important in controlling blood glucose  Describes the effects each nutrient has on blood glucose levels  Demonstrates ability to create a balanced meal plan  Demonstrates carbohydrate counting   States when to check blood glucose levels  Demonstrates proper blood glucose monitoring techniques  States the effect of stress and exercise on blood glucose levels  States the importance of limiting caffeine and abstaining from alcohol and smoking  Blood glucose monitor given: [employer] Nano Lot # [id number] Exp: 12-04-13 Blood glucose reading: 96  Patient instructed to monitor glucose levels: FBS: 60 - <90 2 hour: <120  *Patient received handouts:  Nutrition Diabetes and Pregnancy  Carbohydrate Counting List  Patient will be seen for follow-up as needed.

## 2012-11-01 LAB — OB RESULTS CONSOLE RPR: RPR: NONREACTIVE

## 2012-11-21 ENCOUNTER — Inpatient Hospital Stay (HOSPITAL_COMMUNITY): Admission: RE | Admit: 2012-11-21 | Payer: Medicaid Other | Source: Ambulatory Visit

## 2012-11-21 ENCOUNTER — Encounter (HOSPITAL_COMMUNITY)
Admission: RE | Admit: 2012-11-21 | Discharge: 2012-11-21 | Disposition: A | Discharge status: Home / Self Care | Payer: BC Managed Care – PPO | Source: Ambulatory Visit | Attending: Obstetrics and Gynecology | Admitting: Obstetrics and Gynecology

## 2012-11-21 ENCOUNTER — Encounter (HOSPITAL_COMMUNITY): Payer: Self-pay

## 2012-11-21 HISTORY — DX: Type 2 diabetes mellitus without complications: E11.9

## 2012-11-21 HISTORY — DX: Family history of other specified conditions: Z84.89

## 2012-11-21 HISTORY — DX: Anemia, unspecified: D64.9

## 2012-11-21 LAB — CBC
MCH: 23.3 pg — ABNORMAL LOW (ref 26.0–34.0)
Platelets: 173 10*3/uL (ref 150–400)
RBC: 4.29 MIL/uL (ref 3.87–5.11)
WBC: 10.1 10*3/uL (ref 4.0–10.5)

## 2012-11-21 LAB — BASIC METABOLIC PANEL
CO2: 20 mEq/L (ref 19–32)
Calcium: 9.7 mg/dL (ref 8.4–10.5)
Chloride: 102 mEq/L (ref 96–112)
Sodium: 135 mEq/L (ref 135–145)

## 2012-11-21 LAB — RPR: RPR Ser Ql: NONREACTIVE

## 2012-11-21 NOTE — Patient Instructions (Signed)
20 Melanie Mccoy  11/21/2012   Your procedure is scheduled on:  November 22, 2012  Enter through the Main Entrance of [facility] at 1000 AM.  Pick up the phone at the desk and dial [phone].   Call this number if you have problems the morning of surgery: [phone]   Remember:   Do not eat food:After Midnight.  Do not drink clear liquids: after 7:00am  Take these medicines the morning of surgery with A SIP OF WATER: no medications    Do not wear jewelry, make-up or nail polish.  Do not wear lotions, powders, or perfumes. You may wear deodorant.  Do not shave 48 hours prior to surgery.  Do not bring valuables to the hospital.  [facility] is not   responsible for any belongings or valuables brought to the hospital.  Contacts, dentures or bridgework may not be worn into surgery.  Leave suitcase in the car. After surgery it may be brought to your room.  For patients admitted to the hospital, checkout time is 11:00 AM the day of              discharge.   Patients discharged the day of surgery will not be allowed to drive             home.  Name and phone number of your driver: Leonette Most [phone]  Special Instructions:   Shower using CHG 2 nights before surgery and the night before surgery.  If you shower the day of surgery use CHG.  Use special wash - you have one bottle of CHG for all showers.  You should use approximately 1/3 of the bottle for each shower.   Please read over the following fact sheets that you were given:   Skin to skin

## 2012-11-21 NOTE — OR Nursing (Signed)
Message left for Adrianne for Dr Estanislado Pandy to place pre op orders

## 2012-11-21 NOTE — H&P (Signed)
  Melanie Mccoy is a 22 y.o. female presenting for elective primary cesarean section at 39+4 weeks for GDM with macrosomia.  OB History   Grav Para Term Preterm Abortions TAB SAB Ect Mult Living   1 1             Past Medical History  Diagnosis Date  . No pertinent past medical history   . Bartholin cyst   . Chlamydia 01/2011   Past surgical history: I&D pilonidal cyst 2005 Family History: PGM with breast cancer  MGM with sarcoidosis Social History:  reports that she has never smoked. She has never used smokeless tobacco. She reports that she does not drink alcohol or use illicit drugs.   Prenatal labs: ABO, Rh: A+ Antibody: Negative Rubella: immune RPR: Nonreactive (08/28 0000)  HBsAg: Negative (03/21 0000)  HIV: Non-reactive (03/21 0000)  GBS: Negative (08/28 0000)    Prenatal Transfer Tool  Maternal Diabetes: Yes:  Diabetes Type:  Diet controlled Genetic Screening: Declined Maternal Ultrasounds/Referrals: [facility] 11/20/12 9 lbs 8 oz  (4300 g) Fetal Ultrasounds or other Referrals:  None Maternal Substance Abuse:  No Significant Maternal Medications:  None Significant Maternal Lab Results: None    BP: 120/70    Weight:  206 lbs  General Appearance: Alert, appropriate appearance for age. No acute distress HEENT Exam: Grossly normal Chest/Respiratory Exam: Normal chest wall and respirations. Clear to auscultation  Cardiovascular Exam: Regular rate and rhythm. S1, S2, no murmur Gastrointestinal Exam: soft, non-tender, Uterus gravid with size compatible with GA measuring 40 cm, Vertex presentation by Leopold's maneuvers and ultrasound Psychiatric Exam: Alert and oriented, appropriate affect  ++++++++++++++++++++++++++++++++++++++++++++++++++++++++++++++++  Vaginal exam: deferred  ++++++++++++++++++++++++++++++++++++++++++++++++++++++++++++++++  Assessment/Plan:  IUP at 39+4 weeks with diet-controlled GDM but suspected macrosomia Proceed with elective primary cesarean  section due to elevated risks of shoulder dystocia   Cesarean section reviewed with pt with R&B including but not limited to:  bleeding, infection, injury to other organs. Low transverse approach planned which will allow vaginal delivery with future pregnancies. Should a vertical incision or inverted T be needed, patient is aware that repeat cesarean sections would be recommended in the future. Expected hospital stay and recovery also discussed.    Silverio Lay MD 11/21/2012, 1:46 PM

## 2012-11-21 NOTE — Progress Notes (Signed)
Dr Rodman Pickle made aware of hgb and fact that pt is a Jehovah's witness and refusing blood

## 2012-11-22 ENCOUNTER — Inpatient Hospital Stay (HOSPITAL_COMMUNITY)
Admission: AD | Admit: 2012-11-22 | Discharge: 2012-11-25 | DRG: 370 | Disposition: A | Discharge status: Home / Self Care | Payer: BC Managed Care – PPO | Source: Ambulatory Visit | Attending: Obstetrics and Gynecology | Admitting: Obstetrics and Gynecology

## 2012-11-22 ENCOUNTER — Encounter (HOSPITAL_COMMUNITY): Payer: Self-pay | Admitting: Pharmacy Technician

## 2012-11-22 ENCOUNTER — Other Ambulatory Visit: Payer: Self-pay | Admitting: Obstetrics and Gynecology

## 2012-11-22 ENCOUNTER — Encounter (HOSPITAL_COMMUNITY)
Admission: AD | Disposition: A | Discharge status: Home / Self Care | Payer: Self-pay | Source: Ambulatory Visit | Attending: Obstetrics and Gynecology

## 2012-11-22 ENCOUNTER — Encounter (HOSPITAL_COMMUNITY): Payer: Self-pay | Admitting: *Deleted

## 2012-11-22 ENCOUNTER — Inpatient Hospital Stay (HOSPITAL_COMMUNITY): Payer: BC Managed Care – PPO | Admitting: Anesthesiology

## 2012-11-22 ENCOUNTER — Encounter (HOSPITAL_COMMUNITY): Payer: Self-pay | Admitting: Anesthesiology

## 2012-11-22 DIAGNOSIS — D649 Anemia, unspecified: Secondary | ICD-10-CM | POA: Diagnosis not present

## 2012-11-22 DIAGNOSIS — O9903 Anemia complicating the puerperium: Secondary | ICD-10-CM | POA: Diagnosis not present

## 2012-11-22 DIAGNOSIS — Z98891 History of uterine scar from previous surgery: Secondary | ICD-10-CM

## 2012-11-22 DIAGNOSIS — O3660X0 Maternal care for excessive fetal growth, unspecified trimester, not applicable or unspecified: Principal | ICD-10-CM | POA: Diagnosis present

## 2012-11-22 DIAGNOSIS — O99814 Abnormal glucose complicating childbirth: Secondary | ICD-10-CM | POA: Diagnosis present

## 2012-11-22 DIAGNOSIS — O09293 Supervision of pregnancy with other poor reproductive or obstetric history, third trimester: Secondary | ICD-10-CM

## 2012-11-22 DIAGNOSIS — O24419 Gestational diabetes mellitus in pregnancy, unspecified control: Secondary | ICD-10-CM | POA: Diagnosis present

## 2012-11-22 LAB — GLUCOSE, CAPILLARY: Glucose-Capillary: 89 mg/dL (ref 70–99)

## 2012-11-22 SURGERY — Surgical Case
Anesthesia: Spinal | Site: Abdomen | Wound class: Clean Contaminated

## 2012-11-22 MED ORDER — PHENYLEPHRINE HCL 10 MG/ML IJ SOLN
INTRAMUSCULAR | Status: DC | PRN
  Administered 2012-11-22: 40 ug via INTRAVENOUS
  Administered 2012-11-22: 120 ug via INTRAVENOUS
  Administered 2012-11-22 (×3): 80 ug via INTRAVENOUS

## 2012-11-22 MED ORDER — DEXTROSE 5 % IV SOLN
1.0000 ug/kg/h | INTRAVENOUS | Status: DC | PRN
  Filled 2012-11-22: qty 2

## 2012-11-22 MED ORDER — KETOROLAC TROMETHAMINE 30 MG/ML IJ SOLN
30.0000 mg | Freq: Four times a day (QID) | INTRAMUSCULAR | Status: AC | PRN
  Administered 2012-11-22: 30 mg via INTRAMUSCULAR

## 2012-11-22 MED ORDER — MEPERIDINE HCL 25 MG/ML IJ SOLN: 6.2500 mg | INTRAMUSCULAR | Status: DC | PRN

## 2012-11-22 MED ORDER — OXYTOCIN 40 UNITS IN LACTATED RINGERS INFUSION - SIMPLE MED: 62.5000 mL/h | INTRAVENOUS | Status: AC

## 2012-11-22 MED ORDER — NALBUPHINE HCL 10 MG/ML IJ SOLN
5.0000 mg | INTRAMUSCULAR | Status: DC | PRN
  Filled 2012-11-22: qty 1

## 2012-11-22 MED ORDER — OXYCODONE-ACETAMINOPHEN 5-325 MG PO TABS
1.0000 | ORAL_TABLET | ORAL | Status: DC | PRN
  Administered 2012-11-23 – 2012-11-24 (×4): 1 via ORAL
  Filled 2012-11-22 (×4): qty 1

## 2012-11-22 MED ORDER — FENTANYL CITRATE 0.05 MG/ML IJ SOLN
INTRAMUSCULAR | Status: AC
  Filled 2012-11-22: qty 2

## 2012-11-22 MED ORDER — CEFAZOLIN SODIUM-DEXTROSE 2-3 GM-% IV SOLR
2.0000 g | Freq: Once | INTRAVENOUS | Status: AC
  Administered 2012-11-22: 2 g via INTRAVENOUS
  Filled 2012-11-22: qty 50

## 2012-11-22 MED ORDER — ZOLPIDEM TARTRATE 5 MG PO TABS: 5.0000 mg | ORAL_TABLET | Freq: Every evening | ORAL | Status: DC | PRN

## 2012-11-22 MED ORDER — LANOLIN HYDROUS EX OINT: 1.0000 "application " | TOPICAL_OINTMENT | CUTANEOUS | Status: DC | PRN

## 2012-11-22 MED ORDER — MORPHINE SULFATE 0.5 MG/ML IJ SOLN
INTRAMUSCULAR | Status: AC
  Filled 2012-11-22: qty 10

## 2012-11-22 MED ORDER — WITCH HAZEL-GLYCERIN EX PADS: 1.0000 "application " | MEDICATED_PAD | CUTANEOUS | Status: DC | PRN

## 2012-11-22 MED ORDER — DIPHENHYDRAMINE HCL 50 MG/ML IJ SOLN: 25.0000 mg | INTRAMUSCULAR | Status: DC | PRN

## 2012-11-22 MED ORDER — DIPHENHYDRAMINE HCL 25 MG PO CAPS: 25.0000 mg | ORAL_CAPSULE | Freq: Four times a day (QID) | ORAL | Status: DC | PRN

## 2012-11-22 MED ORDER — EPHEDRINE 5 MG/ML INJ
INTRAVENOUS | Status: AC
  Filled 2012-11-22: qty 10

## 2012-11-22 MED ORDER — ONDANSETRON HCL 4 MG/2ML IJ SOLN: 4.0000 mg | Freq: Three times a day (TID) | INTRAMUSCULAR | Status: DC | PRN

## 2012-11-22 MED ORDER — FENTANYL CITRATE 0.05 MG/ML IJ SOLN: 25.0000 ug | INTRAMUSCULAR | Status: DC | PRN

## 2012-11-22 MED ORDER — ONDANSETRON HCL 4 MG/2ML IJ SOLN: 4.0000 mg | INTRAMUSCULAR | Status: DC | PRN

## 2012-11-22 MED ORDER — BUPIVACAINE HCL (PF) 0.25 % IJ SOLN
INTRAMUSCULAR | Status: AC
  Filled 2012-11-22: qty 30

## 2012-11-22 MED ORDER — OXYTOCIN 10 UNIT/ML IJ SOLN
40.0000 [IU] | INTRAVENOUS | Status: DC | PRN
  Administered 2012-11-22: 40 [IU] via INTRAVENOUS

## 2012-11-22 MED ORDER — DEXTROSE IN LACTATED RINGERS 5 % IV SOLN
INTRAVENOUS | Status: DC
  Administered 2012-11-22 – 2012-11-23 (×2): via INTRAVENOUS

## 2012-11-22 MED ORDER — SIMETHICONE 80 MG PO CHEW
80.0000 mg | CHEWABLE_TABLET | ORAL | Status: DC
  Administered 2012-11-23 (×2): 80 mg via ORAL

## 2012-11-22 MED ORDER — MENTHOL 3 MG MT LOZG: 1.0000 | LOZENGE | OROMUCOSAL | Status: DC | PRN

## 2012-11-22 MED ORDER — ONDANSETRON HCL 4 MG PO TABS: 4.0000 mg | ORAL_TABLET | ORAL | Status: DC | PRN

## 2012-11-22 MED ORDER — BUPIVACAINE IN DEXTROSE 0.75-8.25 % IT SOLN
INTRATHECAL | Status: DC | PRN
  Administered 2012-11-22: 10.5 mg via INTRATHECAL

## 2012-11-22 MED ORDER — SCOPOLAMINE 1 MG/3DAYS TD PT72
1.0000 | MEDICATED_PATCH | Freq: Once | TRANSDERMAL | Status: DC
  Administered 2012-11-22: 1.5 mg via TRANSDERMAL

## 2012-11-22 MED ORDER — PRENATAL MULTIVITAMIN CH
1.0000 | ORAL_TABLET | Freq: Every day | ORAL | Status: DC
  Administered 2012-11-23 – 2012-11-25 (×3): 1 via ORAL
  Filled 2012-11-22 (×3): qty 1

## 2012-11-22 MED ORDER — BUPIVACAINE HCL (PF) 0.25 % IJ SOLN
INTRAMUSCULAR | Status: DC | PRN
  Administered 2012-11-22: 20 mL

## 2012-11-22 MED ORDER — SCOPOLAMINE 1 MG/3DAYS TD PT72: 1.0000 | MEDICATED_PATCH | Freq: Once | TRANSDERMAL | Status: DC

## 2012-11-22 MED ORDER — OXYTOCIN 10 UNIT/ML IJ SOLN
INTRAMUSCULAR | Status: AC
  Filled 2012-11-22: qty 4

## 2012-11-22 MED ORDER — KETOROLAC TROMETHAMINE 30 MG/ML IJ SOLN
INTRAMUSCULAR | Status: AC
  Administered 2012-11-22: 30 mg via INTRAMUSCULAR
  Filled 2012-11-22: qty 1

## 2012-11-22 MED ORDER — PHENYLEPHRINE 40 MCG/ML (10ML) SYRINGE FOR IV PUSH (FOR BLOOD PRESSURE SUPPORT)
PREFILLED_SYRINGE | INTRAVENOUS | Status: AC
  Filled 2012-11-22: qty 5

## 2012-11-22 MED ORDER — NALOXONE HCL 0.4 MG/ML IJ SOLN: 0.4000 mg | INTRAMUSCULAR | Status: DC | PRN

## 2012-11-22 MED ORDER — FERROUS SULFATE 325 (65 FE) MG PO TABS
325.0000 mg | ORAL_TABLET | Freq: Two times a day (BID) | ORAL | Status: DC
  Administered 2012-11-23 – 2012-11-25 (×5): 325 mg via ORAL
  Filled 2012-11-22 (×5): qty 1

## 2012-11-22 MED ORDER — FENTANYL CITRATE 0.05 MG/ML IJ SOLN
INTRAMUSCULAR | Status: DC | PRN
  Administered 2012-11-22: 15 ug via INTRATHECAL

## 2012-11-22 MED ORDER — ONDANSETRON HCL 4 MG/2ML IJ SOLN
INTRAMUSCULAR | Status: DC | PRN
  Administered 2012-11-22: 4 mg via INTRAVENOUS

## 2012-11-22 MED ORDER — PROMETHAZINE HCL 25 MG/ML IJ SOLN: 6.2500 mg | INTRAMUSCULAR | Status: DC | PRN

## 2012-11-22 MED ORDER — DIPHENHYDRAMINE HCL 25 MG PO CAPS
25.0000 mg | ORAL_CAPSULE | ORAL | Status: DC | PRN
  Filled 2012-11-22: qty 1

## 2012-11-22 MED ORDER — MIDAZOLAM HCL 2 MG/2ML IJ SOLN: 0.5000 mg | Freq: Once | INTRAMUSCULAR | Status: DC | PRN

## 2012-11-22 MED ORDER — TETANUS-DIPHTH-ACELL PERTUSSIS 5-2.5-18.5 LF-MCG/0.5 IM SUSP: 0.5000 mL | Freq: Once | INTRAMUSCULAR | Status: DC

## 2012-11-22 MED ORDER — METHYLERGONOVINE MALEATE 0.2 MG PO TABS: 0.2000 mg | ORAL_TABLET | ORAL | Status: DC | PRN

## 2012-11-22 MED ORDER — SIMETHICONE 80 MG PO CHEW
80.0000 mg | CHEWABLE_TABLET | Freq: Three times a day (TID) | ORAL | Status: DC
  Administered 2012-11-22 – 2012-11-25 (×8): 80 mg via ORAL

## 2012-11-22 MED ORDER — IBUPROFEN 600 MG PO TABS
600.0000 mg | ORAL_TABLET | Freq: Four times a day (QID) | ORAL | Status: DC
  Administered 2012-11-23 – 2012-11-25 (×10): 600 mg via ORAL
  Filled 2012-11-22 (×10): qty 1

## 2012-11-22 MED ORDER — CEFAZOLIN SODIUM-DEXTROSE 2-3 GM-% IV SOLR
INTRAVENOUS | Status: AC
  Filled 2012-11-22: qty 50

## 2012-11-22 MED ORDER — MORPHINE SULFATE (PF) 0.5 MG/ML IJ SOLN
INTRAMUSCULAR | Status: DC | PRN
  Administered 2012-11-22: 100 ug via INTRATHECAL

## 2012-11-22 MED ORDER — DIBUCAINE 1 % RE OINT: 1.0000 "application " | TOPICAL_OINTMENT | RECTAL | Status: DC | PRN

## 2012-11-22 MED ORDER — METOCLOPRAMIDE HCL 5 MG/ML IJ SOLN: 10.0000 mg | Freq: Three times a day (TID) | INTRAMUSCULAR | Status: DC | PRN

## 2012-11-22 MED ORDER — KETOROLAC TROMETHAMINE 30 MG/ML IJ SOLN
30.0000 mg | Freq: Four times a day (QID) | INTRAMUSCULAR | Status: AC | PRN
  Administered 2012-11-22: 30 mg via INTRAVENOUS
  Filled 2012-11-22: qty 1

## 2012-11-22 MED ORDER — SENNOSIDES-DOCUSATE SODIUM 8.6-50 MG PO TABS
2.0000 | ORAL_TABLET | ORAL | Status: DC
  Administered 2012-11-23 (×2): 2 via ORAL

## 2012-11-22 MED ORDER — SCOPOLAMINE 1 MG/3DAYS TD PT72
MEDICATED_PATCH | TRANSDERMAL | Status: AC
  Administered 2012-11-22: 1.5 mg via TRANSDERMAL
  Filled 2012-11-22: qty 1

## 2012-11-22 MED ORDER — SODIUM CHLORIDE 0.9 % IJ SOLN: 3.0000 mL | INTRAMUSCULAR | Status: DC | PRN

## 2012-11-22 MED ORDER — METHYLERGONOVINE MALEATE 0.2 MG/ML IJ SOLN: 0.2000 mg | INTRAMUSCULAR | Status: DC | PRN

## 2012-11-22 MED ORDER — LACTATED RINGERS IV SOLN
INTRAVENOUS | Status: DC
  Administered 2012-11-22 (×4): via INTRAVENOUS

## 2012-11-22 MED ORDER — ONDANSETRON HCL 4 MG/2ML IJ SOLN
INTRAMUSCULAR | Status: AC
  Filled 2012-11-22: qty 2

## 2012-11-22 MED ORDER — MEASLES, MUMPS & RUBELLA VAC ~~LOC~~ INJ
0.5000 mL | INJECTION | Freq: Once | SUBCUTANEOUS | Status: DC
  Filled 2012-11-22: qty 0.5

## 2012-11-22 MED ORDER — DIPHENHYDRAMINE HCL 50 MG/ML IJ SOLN: 12.5000 mg | INTRAMUSCULAR | Status: DC | PRN

## 2012-11-22 MED ORDER — SIMETHICONE 80 MG PO CHEW: 80.0000 mg | CHEWABLE_TABLET | ORAL | Status: DC | PRN

## 2012-11-22 SURGICAL SUPPLY — 38 items
BENZOIN TINCTURE PRP APPL 2/3 (GAUZE/BANDAGES/DRESSINGS) ×2 IMPLANT
BOOTIES KNEE HIGH SLOAN (MISCELLANEOUS) ×4 IMPLANT
CLAMP CORD UMBIL (MISCELLANEOUS) IMPLANT
CLEANER TIP ELECTROSURG 2X2 (MISCELLANEOUS) ×2 IMPLANT
CLOTH BEACON ORANGE TIMEOUT ST (SAFETY) ×2 IMPLANT
DEVICE BLD TRNS LUER ATTCH (MISCELLANEOUS) ×2 IMPLANT
DRAIN JACKSON PRT FLT 10 (DRAIN) IMPLANT
DRAPE LG THREE QUARTER DISP (DRAPES) ×4 IMPLANT
DRSG OPSITE POSTOP 4X10 (GAUZE/BANDAGES/DRESSINGS) ×2 IMPLANT
DURAPREP 26ML APPLICATOR (WOUND CARE) ×2 IMPLANT
ELECT REM PT RETURN 9FT ADLT (ELECTROSURGICAL) ×2
ELECTRODE REM PT RTRN 9FT ADLT (ELECTROSURGICAL) ×1 IMPLANT
EVACUATOR SILICONE 100CC (DRAIN) IMPLANT
EXTRACTOR VACUUM M CUP 4 TUBE (SUCTIONS) IMPLANT
GLOVE BIOGEL PI IND STRL 7.0 (GLOVE) ×1 IMPLANT
GLOVE BIOGEL PI INDICATOR 7.0 (GLOVE) ×1
GLOVE ECLIPSE 6.5 STRL STRAW (GLOVE) ×2 IMPLANT
GOWN PREVENTION PLUS XLARGE (GOWN DISPOSABLE) ×2 IMPLANT
GOWN STRL REIN XL XLG (GOWN DISPOSABLE) ×2 IMPLANT
KIT ABG SYR 3ML LUER SLIP (SYRINGE) IMPLANT
NEEDLE HYPO 22GX1.5 SAFETY (NEEDLE) ×2 IMPLANT
NEEDLE HYPO 25X5/8 SAFETYGLIDE (NEEDLE) IMPLANT
NS IRRIG 1000ML POUR BTL (IV SOLUTION) ×4 IMPLANT
PACK C SECTION WH (CUSTOM PROCEDURE TRAY) ×2 IMPLANT
PAD OB MATERNITY 4.3X12.25 (PERSONAL CARE ITEMS) ×2 IMPLANT
PENCIL BUTTON HOLSTER BLD 10FT (ELECTRODE) ×2 IMPLANT
RTRCTR C-SECT PINK 25CM LRG (MISCELLANEOUS) ×2 IMPLANT
STRIP CLOSURE SKIN 1/2X4 (GAUZE/BANDAGES/DRESSINGS) ×2 IMPLANT
SUT CHROMIC GUT AB #0 18 (SUTURE) IMPLANT
SUT MNCRL AB 3-0 PS2 27 (SUTURE) ×2 IMPLANT
SUT SILK 2 0 FSL 18 (SUTURE) IMPLANT
SUT VIC AB 0 CTX 36 (SUTURE) ×2
SUT VIC AB 0 CTX36XBRD ANBCTRL (SUTURE) ×2 IMPLANT
SUT VIC AB 1 CT1 36 (SUTURE) ×4 IMPLANT
SYR 20CC LL (SYRINGE) ×2 IMPLANT
TOWEL OR 17X24 6PK STRL BLUE (TOWEL DISPOSABLE) ×2 IMPLANT
TRAY FOLEY CATH 14FR (SET/KITS/TRAYS/PACK) ×2 IMPLANT
WATER STERILE IRR 1000ML POUR (IV SOLUTION) ×2 IMPLANT

## 2012-11-22 NOTE — Op Note (Signed)
Preoperative diagnosis: Intrauterine pregnancy at 39 weeks and 4 days , GDM, suspected macrosomia  Post operative diagnosis: Same  Anesthesia: Spinal  Anesthesiologist: Dr. Rodman Pickle  Procedure: Primary low transverse cesarean section  Surgeon: Dr. Dois Davenport Julien Berryman  Assistant: none  Estimated blood loss: 600 cc  Procedure:  After being informed of the planned procedure and possible complications including bleeding, infection, injury to other organs, informed consent is obtained. The patient is taken to OR #9 and given spinal anesthesia without complication. She is placed in the dorsal decubitus position with the pelvis tilted to the left. She is then prepped and draped in a sterile fashion. A Foley catheter is inserted in her bladder.  After assessing adequate level of anesthesia, we infiltrate the suprapubic area with 20 cc of Marcaine 0.25 and perform a Pfannenstiel incision which is brought down sharply to the fascia. The fascia is entered in a low transverse fashion. Linea alba is dissected. Peritoneum is entered in a midline fashion. An Alexis retractor is easily positioned.   The myometrium is then entered in a low transverse fashion, 2 cm above the vesico-uterine junction ; first with knife and then extended bluntly. Amniotic fluid is clear. We assist the birth of a female  infant in vertex presentation. Mouth and nose are suctioned. The baby is delivered. The cord is clamped and sectioned. The baby is given to the neonatologist present in the room.  10 cc of blood is drawn from the umbilical vein.The placenta is allowed to deliver spontaneously. It is complete and the cord has 3 vessels. Uterine revision is negative.  We proceed with closure of the myometrium in 2 layers: First with a running locked suture of 0 Vicryl, then with a Lembert suture of 0 Vicryl imbricating the first one. Hemostasis is completed with cauterization on peritoneal edges.  Both paracolic gutters are cleaned.  Both tubes and ovaries are assessed and normal. The pelvis is profusely irrigated with warm saline to confirm a satisfactory hemostasis.  Retractors and sponges are removed. Under fascia hemostasis is completed with cauterization. The fascia is then closed with 2 running sutures of 0 Vicryl meeting midline. The wound is irrigated with warm saline and hemostasis is completed with cauterization. The skin is closed with a subcuticular suture of 3-0 Monocryl and Steri-Strips.  Instrument and sponge count is complete x2. Estimated blood loss is 600 cc.  The procedure is well tolerated by the patient who is taken to recovery room in a well and stable condition.  female baby named [name] was born at 12:09 and received an Apgar of 8  at 1 minute and 9 at 5 minutes.    Specimen: Placenta sent to L & D then to Cord Blood donation   [facility] A MD 9/18/201412:53 PM

## 2012-11-22 NOTE — Consult Note (Signed)
Neonatology Note:   Attendance at C-section:    I was asked by Dr. Estanislado Pandy to attend this primary C/S at term due to fetal macrosomia. The mother is a G2P1 A pos, GBS neg with diet-controlled GDM. ROM at delivery, fluid clear. CAN times 2.  Infant vigorous with good spontaneous cry and tone. Needed only minimal bulb suctioning. Ap 8/9. Lungs clear to ausc in DR. To CN to care of Pediatrician.   Doretha Sou, MD

## 2012-11-22 NOTE — Anesthesia Postprocedure Evaluation (Signed)
  Anesthesia Post-op Note  Patient: Melanie Mccoy SAMS  Procedure(s) Performed: Procedure(s): PRIMARY CESAREAN SECTION (N/A)  Patient Location: Mother/Baby  Anesthesia Type:Spinal  Level of Consciousness: awake  Airway and Oxygen Therapy: Patient Spontanous Breathing  Post-op Pain: mild  Post-op Assessment: Patient's Cardiovascular Status Stable and Respiratory Function Stable  Post-op Vital Signs: stable  Complications: No apparent anesthesia complications

## 2012-11-22 NOTE — Interval H&P Note (Signed)
History and Physical Interval Note:  11/22/2012 11:13 AM  Melanie Mccoy  has presented today for surgery, with the diagnosis of Gestational Diabetes, suspected macromia; CPT [phone]  The various methods of treatment have been discussed with the patient and family. After consideration of risks, benefits and other options for treatment, the patient has consented to  Procedure(s) with comments: PRIMARY CESAREAN SECTION (N/A) - Patient does NOT accept Blood (no transfusions) as a surgical intervention .  The patient's history has been reviewed, patient examined, no change in status, stable for surgery.  I have reviewed the patient's chart and labs.  Questions were answered to the patient's satisfaction.     Tenzin Edelman A

## 2012-11-22 NOTE — Anesthesia Procedure Notes (Signed)
Spinal  Patient location during procedure: OR Start time: 11/22/2012 11:38 AM Staffing Performed by: anesthesiologist  Preanesthetic Checklist Completed: patient identified, site marked, surgical consent, pre-op evaluation, timeout performed, IV checked, risks and benefits discussed and monitors and equipment checked Spinal Block Patient position: sitting Prep: site prepped and draped and DuraPrep Patient monitoring: heart rate, cardiac monitor, continuous pulse ox and blood pressure Approach: midline Location: L3-4 Injection technique: single-shot Needle Needle type: Pencan  Needle gauge: 24 G Needle length: 9 cm Assessment Sensory level: T4 Additional Notes Clear free flow CSF on first attempt.  No paresthesia.  Patient tolerated procedure well with no apparent complications.  Jasmine December, MD

## 2012-11-22 NOTE — Anesthesia Preprocedure Evaluation (Signed)
Anesthesia Evaluation  Patient identified by MRN, date of birth, ID band Patient awake    Reviewed: Allergy & Precautions, H&P , NPO status , Patient's Chart, lab work & pertinent test results, reviewed documented beta blocker date and time   History of Anesthesia Complications Negative for: history of anesthetic complications  Airway Mallampati: III TM Distance: >3 FB Neck ROM: full    Dental  (+) Teeth Intact   Pulmonary neg pulmonary ROS,  breath sounds clear to auscultation        Cardiovascular negative cardio ROS  Rhythm:regular Rate:Normal     Neuro/Psych negative neurological ROS  negative psych ROS   GI/Hepatic negative GI ROS, Neg liver ROS,   Endo/Other  diabetes (diet controlled), GestationalBMI 36  Renal/GU negative Renal ROS  negative genitourinary   Musculoskeletal   Abdominal   Peds  Hematology  (+) anemia , REFUSES BLOOD PRODUCTS,   Anesthesia Other Findings   Reproductive/Obstetrics (+) Pregnancy                           Anesthesia Physical Anesthesia Plan  ASA: II  Anesthesia Plan: Spinal   Post-op Pain Management:    Induction:   Airway Management Planned:   Additional Equipment:   Intra-op Plan:   Post-operative Plan:   Informed Consent: I have reviewed the patients History and Physical, chart, labs and discussed the procedure including the risks, benefits and alternatives for the proposed anesthesia with the patient or authorized representative who has indicated his/her understanding and acceptance.     Plan Discussed with: Surgeon and CRNA  Anesthesia Plan Comments:         Anesthesia Quick Evaluation

## 2012-11-22 NOTE — Anesthesia Postprocedure Evaluation (Signed)
  Anesthesia Post Note  Patient: Melanie Mccoy  Procedure(s) Performed: Procedure(s) (LRB): PRIMARY CESAREAN SECTION (N/A)  Anesthesia type: Spinal  Patient location: PACU  Post pain: Pain level controlled  Post assessment: Post-op Vital signs reviewed  Last Vitals:  Filed Vitals:   11/22/12 1403  BP:   Pulse: 60  Temp: 36.7 C  Resp: 18    Post vital signs: Reviewed  Level of consciousness: awake  Complications: No apparent anesthesia complications

## 2012-11-22 NOTE — Transfer of Care (Signed)
Immediate Anesthesia Transfer of Care Note  Patient: Melanie Mccoy  Procedure(s) Performed: Procedure(s): PRIMARY CESAREAN SECTION (N/A)  Patient Location: PACU  Anesthesia Type:Regional and Spinal  Level of Consciousness: awake, alert  and oriented  Airway & Oxygen Therapy: Patient Spontanous Breathing  Post-op Assessment: Report given to PACU RN and Post -op Vital signs reviewed and stable  Post vital signs: Reviewed and stable  Complications: No apparent anesthesia complications

## 2012-11-23 ENCOUNTER — Encounter (HOSPITAL_COMMUNITY): Payer: Self-pay | Admitting: Obstetrics and Gynecology

## 2012-11-23 LAB — CCBB MATERNAL DONOR DRAW

## 2012-11-23 LAB — CBC
MCH: 23.8 pg — ABNORMAL LOW (ref 26.0–34.0)
MCHC: 32 g/dL (ref 30.0–36.0)
RDW: 17.2 % — ABNORMAL HIGH (ref 11.5–15.5)

## 2012-11-23 LAB — BIRTH TISSUE RECOVERY COLLECTION (PLACENTA DONATION)

## 2012-11-23 NOTE — Progress Notes (Signed)
Subjective: Postpartum Day 1: Cesarean Delivery Patient reports tolerating PO and no problems voiding.    Objective: Vital signs in last 24 hours: Temp:  [97.7 F (36.5 C)-99 F (37.2 C)] 98.4 F (36.9 C) (09/19 0900) Pulse Rate:  [58-87] 87 (09/19 0900) Resp:  [10-20] 18 (09/19 0900) BP: (103-126)/(39-94) 107/53 mmHg (09/19 0900) SpO2:  [96 %-100 %] 96 % (09/19 0900) Weight:  [204 lb (92.534 kg)] 204 lb (92.534 kg) (09/18 1403)  Physical Exam:  General: alert, cooperative and appears stated age Lochia: appropriate Uterine Fundus: firm Incision: healing well, no dehiscence, no significant erythema DVT Evaluation: No evidence of DVT seen on physical exam.   Recent Labs  11/21/12 1450 11/23/12 0600  HGB 10.0* 7.9*  HCT 32.0* 24.7*    Assessment/Plan: Status post Cesarean section. Doing well postoperatively.  FBS 100. Not a complete fasting because she ate all through the night.  Continue care  Suellen Durocher A 11/23/2012, 11:33 AM

## 2012-11-24 ENCOUNTER — Encounter (HOSPITAL_COMMUNITY): Payer: Self-pay

## 2012-11-24 NOTE — Lactation Note (Signed)
This note was copied from the chart of Melanie Mccoy. Lactation Consultation Note: Follow up visit with mom. She had baby latched to breast when I went in. Mom reports that baby nurses for a few minutes then goes off to sleep. Encouraged to keep baby stimulated to continue nursing. Encouraged to try to nurse on both breasts at each feeding and to try to get at least 15 minutes of good sucking at each breast. Mom able to express whitish milk. Encouragement given. To try to rest when baby does. No stool for greater than 24 hours- 2 voids today. No questions at present. To call for assist prn.  Patient Name: Melanie Mccoy [id number] Date: 11/24/2012 Reason for consult: Follow-up assessment   Maternal Data    Feeding Feeding Type: Breast Milk  LATCH Score/Interventions Latch: Repeated attempts needed to sustain latch, nipple held in mouth throughout feeding, stimulation needed to elicit sucking reflex.  Audible Swallowing: A few with stimulation  Type of Nipple: Everted at rest and after stimulation  Comfort (Breast/Nipple): Soft / non-tender     Hold (Positioning): Assistance needed to correctly position infant at breast and maintain latch. Intervention(s): Breastfeeding basics reviewed;Support Pillows  LATCH Score: 7  Lactation Tools Discussed/Used     Consult Status Consult Status: Follow-up Date: 11/25/12 Follow-up type: In-patient    Pamelia Hoit 11/24/2012, 3:48 PM

## 2012-11-24 NOTE — Progress Notes (Signed)
Subjective: Postpartum Day 2: Cesarean Delivery due to macrosomia, gestational diabetes Patient up ad lib, reports no syncope or dizziness. Feeding:  Breast--baby has lost some weight, sleepy at breast.  LCs working with patient today. Contraceptive plan:  Undecided.  Objective: Vital signs in last 24 hours: Temp:  [98.5 F (36.9 C)-98.8 F (37.1 C)] 98.6 F (37 C) (09/20 0535) Pulse Rate:  [65-78] 70 (09/20 0535) Resp:  [18] 18 (09/20 0535) BP: (111-126)/(53-74) 111/55 mmHg (09/20 0535) SpO2:  [98 %] 98 % (09/20 0535)  Physical Exam:  General: alert Lochia: appropriate Uterine Fundus: firm Incision: Dressing CDI DVT Evaluation: No evidence of DVT seen on physical exam. Negative Homan's sign. JP drain:   NA   Recent Labs  11/21/12 1450 11/23/12 0600  HGB 10.0* 7.9*  HCT 32.0* 24.7*  Orthostatics stable.  Assessment/Plan: Status post Cesarean section day 2. Anemia without hemodynamic instability. FeSO4 daily Anticipate d/c tomorrow. Reviewed contraceptive options--will decide by 6 weeks.    Nigel Bridgeman 11/24/2012, 9:27 AM

## 2012-11-24 NOTE — Discharge Summary (Signed)
  Cesarean Section Delivery Discharge Summary  Melanie Mccoy  DOB:    March 31, 1990 MRN:    [id number] CSN:    [id number]  Date of admission:                  2012/11/29  Date of discharge:                   11/25/12  Procedures this admission:  Date of Delivery: 29-Nov-2012  Newborn Data:  Live born female  Birth Weight: 9 lb 4.3 oz (4205 g) APGAR: 8, 9  Home with mother. Name: Melanie Mccoy   History of Present Illness:  Ms. Melanie Mccoy is a 22 y.o. female, G2P2, who presents at [redacted] weeks gestation. The patient has been followed at the [facility] and Gynecology division of [facility].    Her pregnancy has been complicated by:  Patient Active Problem List   Diagnosis Date Noted  . Gestational diabetes 2012-11-29  . Macrosomia 2012-11-29  . Pregnancy with history of neonatal death in third trimester 2012/11/29  . Status post primary low transverse cesarean section 2012/11/29  .  Hospital course:  The patient was admitted for scheduled primary cesarean delivery due to fetal macrosomia.   Dr. Estanislado Pandy performed the low transverse C/S under spinal anesthesia, with no complications. Her postpartum course was complicated by anemia, but patient had no syncope or dizziness.  She declined transfusion and was placed on supplemental iron.   She was discharged to home on postpartum day 3 doing well.  Feeding:  breast  Contraception:  Will decide by 6 weeks--information given.  Discharge hemoglobin:  Hemoglobin  Date Value Range Status  11/23/2012 7.9* 12.0 - 15.0 g/dL Final     DELTA CHECK NOTED     REPEATED TO VERIFY     HCT  Date Value Range Status  11/23/2012 24.7* 36.0 - 46.0 % Final    Discharge Physical Exam:   General: alert Lochia: appropriate Uterine Fundus: firm Incision: Honeycomb dressing CDI DVT Evaluation: No evidence of DVT seen on physical exam. Negative Homan's sign.  Intrapartum Procedures: cesarean: low  cervical, transverse--elective primary, due to fetal macrosomia, gestational diabetes Postpartum Procedures: none Complications-Operative and Postpartum: Anemia without hemodynamic compromise  Discharge Diagnoses: Term Pregnancy-delivered and gestational diabetes, primary LTCS due to fetal macrosomia  Discharge Information:  Activity:           Per [facility] handout Diet:                routine Medications: Ibuprofen, Iron and Percocet Condition:      stable Instructions:  refer to practice specific booklet Discharge to: home     Nigel Bridgeman 11/24/2012

## 2012-11-25 ENCOUNTER — Ambulatory Visit: Payer: Self-pay

## 2012-11-25 MED ORDER — OXYCODONE-ACETAMINOPHEN 5-325 MG PO TABS: 1.0000 | ORAL_TABLET | ORAL | Status: DC | PRN

## 2012-11-25 MED ORDER — IBUPROFEN 600 MG PO TABS: 600.0000 mg | ORAL_TABLET | Freq: Four times a day (QID) | ORAL | Status: DC

## 2012-11-25 NOTE — Lactation Note (Signed)
This note was copied from the chart of Melanie Mccoy. Lactation Consultation Note  Patient Name: Melanie Mccoy Date: 11/25/2012 Reason for consult: Follow-up assessment Baby changed to baby patient status due to weight loss, Mom milk is in both breast, no engorgement challenges  As of yet. LC observed a feeding this am and noted more non nutritive feeding pattern than nutritive. Improved with #24 Nipple shield. Baby able to stretch tongue over gum line . Stressed to mom if the baby  Gets non- nutritive and with stimulation is unable to get back into a nutritive pattern to release suction , wake her up and  If feeding cues , re-latch with nipple shield. LC suspects infant has developed a pattern of hanging out at the breast when non -nutritive. Mom has pumped X1 since DEBP set up this am with 35 ml fo EBM , milk is in. @ most recent consult at 1430 , used the SNS with EBM , Also the curved tip syringe with EBM instilled EBM in the top of the nipple shield. Baby took 5 ml form SNS/Cured syringe . And fell asleep.  Tried laying baby down in crib and awoke and rooting . Dud to mom not having lunch yet and it's 1455p , LC offered to finger feed with SNS and EBM , and baby took 15 ml. LC noted baby to be non- nutritive at times, and stimulating the posterior portion of the palate , more nutritive pattern noted.  Discussed lactation plan with mom and grandma, due to weight loss, and multi-tasking LC plan , LC recommended - using the nipple shield with SNS on top, and feed on  one breast, pump both breast for 10 mins and next feeding switch to other breast. LC plan is so it will allow baby to gain weight, work with milk being in , and allowing mom some rest and sleep.  Report will be given to 3-11p LC to F/U PRN.     Maternal Data    Feeding Feeding Type: Breast Milk Length of feed: 60 min (on and off ,per mom non nutritive at times )  [facility] Score/Interventions Latch:  Grasps breast easily, tongue down, lips flanged, rhythmical sucking. (#24 Nipple shield ) Intervention(s): Adjust position;Assist with latch;Breast massage;Breast compression  Audible Swallowing: Spontaneous and intermittent  Type of Nipple: Everted at rest and after stimulation (semi compress able areolas, shells indicated )  Comfort (Breast/Nipple): Filling, red/small blisters or bruises, mild/mod discomfort  Problem noted: Filling  Hold (Positioning): Assistance needed to correctly position infant at breast and maintain latch. (worked on depth ) Intervention(s): Breastfeeding basics reviewed (see LC note for LC plan )  LATCH Score: 8  Lactation Tools Discussed/Used Tools: Supplemental Nutrition System;Other (comment) (curved syringe ) Nipple shield size: 24 Shell Type: Inverted Breast pump type:  (pumped off 35 ml at 1250 ) Pump Review: Setup, frequency, and cleaning;Milk Storage (set up by Rory Percy RN )   Consult Status Consult Status: Follow-up Date: 11/25/12 Follow-up type: In-patient    Melanie Mccoy 11/25/2012, 3:15 PM

## 2012-11-26 ENCOUNTER — Ambulatory Visit: Payer: Self-pay

## 2012-11-26 NOTE — Lactation Note (Signed)
This note was copied from the chart of Melanie Aseneth Hack. Lactation Consultation Note: Follow up visit with mom. She had baby latched to right breast- had already nursed on left breast for 10 minutes. Due to weight loss and no stool for 3 days suggested using formula at this feeding to supplement since she has no pumped milk at this time. Mom agreeable. Used #5 french feeding tube and syringe to supplement while baby is at the breast. She took 19 cc's and then off to sleep. Reviewed use and cleaning of feeding tube and syringe with mom and grandmother. No questions at present. Offered OP appointment but since they live in [place] they do not want to make appointment at this time. To call prn.  Patient Name: Melanie Mccoy [id number] Date: 11/26/2012 Reason for consult: Follow-up assessment   Maternal Data    Feeding Feeding Type: Breast Milk Length of feed: 30 min  LATCH Score/Interventions Latch: Grasps breast easily, tongue down, lips flanged, rhythmical sucking. (with NS) Intervention(s): Skin to skin  Audible Swallowing: None Intervention(s): Skin to skin  Type of Nipple: Everted at rest and after stimulation Intervention(s): Double electric pump;Shells (Nipple shield)  Comfort (Breast/Nipple): Soft / non-tender     Hold (Positioning): No assistance needed to correctly position infant at breast.  LATCH Score: 8  Lactation Tools Discussed/Used Tools: Nipple Shields;46F feeding tube / Syringe   Consult Status Consult Status: Follow-up Date: 11/27/12 Follow-up type: In-patient    Pamelia Hoit 11/26/2012, 9:08 AM

## 2013-01-14 ENCOUNTER — Encounter: Payer: Self-pay | Admitting: Family Medicine

## 2013-01-14 ENCOUNTER — Ambulatory Visit (INDEPENDENT_AMBULATORY_CARE_PROVIDER_SITE_OTHER): Payer: BC Managed Care – PPO | Admitting: Family Medicine

## 2013-01-14 VITALS — BP 120/82 | Temp 98.3°F | Ht 64.0 in | Wt 186.4 lb

## 2013-01-14 DIAGNOSIS — B349 Viral infection, unspecified: Secondary | ICD-10-CM

## 2013-01-14 DIAGNOSIS — B9789 Other viral agents as the cause of diseases classified elsewhere: Secondary | ICD-10-CM

## 2013-01-14 NOTE — Progress Notes (Signed)
  Subjective:    Patient ID: [name], female    DOB: [birth year], 22 y.o.   MRN: [id number]  Sore Throat  This is a new problem. The current episode started in the past 7 days. The problem has been unchanged. Neither side of throat is experiencing more pain than the other. There has been no fever. The pain is at a severity of 7/10. The pain is moderate. Associated symptoms comments: Runny nose. She has tried acetaminophen for the symptoms. The treatment provided no relief.    No fevdr, worse in the eve  Diminished energy,  No cough, mostly clear when running  No vomiting no diarrhea fair appetite daughter sick for the past week.  Review of Systems No no weight loss no weight gain no chest pain ROS otherwise negative    Objective:   Physical Exam  Alert hydration good lungs clear. Heart regular in rhythm. H&T normal. Abdomen soft.      Assessment & Plan:  Impression viral syndrome discussed plan symptomatic care discussed. Warning signs discussed. Hold off on antibiotics. WSL

## 2013-09-20 ENCOUNTER — Telehealth: Payer: Self-pay | Admitting: Family Medicine

## 2013-09-20 ENCOUNTER — Emergency Department (HOSPITAL_COMMUNITY)
Admission: EM | Admit: 2013-09-20 | Discharge: 2013-09-20 | Discharge status: Against Medical Advice | Payer: BC Managed Care – PPO

## 2013-09-20 MED ORDER — PREDNISONE 20 MG PO TABS: ORAL_TABLET | ORAL | Status: DC

## 2013-09-20 NOTE — Telephone Encounter (Signed)
She has tried Benadryl with no relief.

## 2013-09-20 NOTE — Telephone Encounter (Signed)
Patient notified and verbalized understanding. 

## 2013-09-20 NOTE — Telephone Encounter (Signed)
Patient said the rash started Tuesday night. She believes it is hives b/c the rash will come and go. It is spread throughout her body, head to toe. It is even on her face. She said she had difficulty breathing last night, so she took Benadryl and then she was fine. The rash is itchy, red, and swollen. The only thing she can think of is that she tried a new sun block on Saturday.

## 2013-09-20 NOTE — Telephone Encounter (Signed)
#  1 verify patient is not pregnant #2 if patient not pregnant prednisone 20 mg 3 daily for 2 days, then 2 daily for 2 days, then 1 daily for 2 days, #12. #3-she should take a nondrowsy allergy tablet daily for the next couple weeks. Claritin or Allegra would be fine one daily store brand is fine. #4-may use Benadryl when necessary itching rash should gradually go away over the next 2-3 days #5-if she has significant progression or worse immediately go to the ER or call 911 if difficulty breathing #6-patient should come in for office visit to discuss further. #7 if possible patient should take cell phone pictures of the rash for future reference

## 2013-09-20 NOTE — Telephone Encounter (Signed)
Patient said she has broke out in hives, and unsure of the cause. But she is requesting that we call in something for her (itch cream, something to help the hives, etc) because she cannot come in the office today.  [facility]

## 2013-09-21 ENCOUNTER — Ambulatory Visit (INDEPENDENT_AMBULATORY_CARE_PROVIDER_SITE_OTHER): Payer: BC Managed Care – PPO | Admitting: Emergency Medicine

## 2013-09-21 VITALS — BP 110/80 | HR 88 | Temp 98.5°F | Resp 12 | Ht 64.65 in | Wt 212.0 lb

## 2013-09-21 DIAGNOSIS — L509 Urticaria, unspecified: Secondary | ICD-10-CM

## 2013-09-21 NOTE — Progress Notes (Signed)
[facility] [address] [place] [address] [phone]  Date:  09/21/2013   Name:  Melanie Mccoy   DOB:  11/19/1990   MRN:  [id number]  PCP:  Harlow Asa, MD    Chief Complaint: Urticaria   History of Present Illness:  Melanie Mccoy is a 23 y.o. very pleasant female patient who presents with the following:  Patient developed hives last weekend.  No know allergen or medical exposure.  Some itching.  No respiratory complaints or difficulty swallowing.   Treated by the family doct wit prednisone and has not yer resolved..   Patient Active Problem List   Diagnosis Date Noted  . Status post primary low transverse cesarean section 11/22/2012    Past Medical History  Diagnosis Date  . No pertinent past medical history   . Bartholin cyst   . Chlamydia 01/2011  . Family history of anesthesia complication     grandmother had spinal with nerve damage  . Diabetes mellitus without complication     gestational diagnosis 2nd trimester  . Anemia     slightly anemic, iron supplements every 2-3 days    Past Surgical History  Procedure Laterality Date  . Cyst removal trunk  2005  . Cesarean section N/A 11/22/2012    Procedure: PRIMARY CESAREAN SECTION;  Surgeon: Esmeralda Arthur, MD;  Location: [facility];  Service: Obstetrics;  Laterality: N/A;    History  Substance Use Topics  . Smoking status: Never Smoker   . Smokeless tobacco: Never Used  . Alcohol Use: No    Family History  Problem Relation Age of Onset  . Cancer Other   . Heart disease Other     Allergies  Allergen Reactions  . Bee Venom Swelling    Progressively worse with each sting  . Nickel Rash    Medication list has been reviewed and updated.  Current Outpatient Prescriptions on File Prior to Visit  Medication Sig Dispense Refill  . ibuprofen (ADVIL,MOTRIN) 600 MG tablet Take 1 tablet (600 mg total) by mouth every 6 (six) hours.  36 tablet  2  . predniSONE (DELTASONE) 20 MG  tablet 3 daily for 2 days, then 2 daily for 2 days, then 1 daily for 2 days  12 tablet  0  . acetaminophen (TYLENOL) 325 MG tablet Take 650 mg by mouth every 6 (six) hours as needed for pain.      . IRON PO Take 1 tablet by mouth every other day.      . oxyCODONE-acetaminophen (PERCOCET/ROXICET) 5-325 MG per tablet Take 1-2 tablets by mouth every 4 (four) hours as needed.  30 tablet  0  . prenatal vitamin w/FE, FA (NATACHEW) 29-1 MG CHEW Chew 1 tablet by mouth daily.         No current facility-administered medications on file prior to visit.    Review of Systems:  As per HPI, otherwise negative.    Physical Examination: Filed Vitals:   09/21/13 1102  BP: 110/80  Pulse: 88  Temp: 98.5 F (36.9 C)  Resp: 12   Filed Vitals:   09/21/13 1102  Height: 5' 4.65" (1.642 m)  Weight: 212 lb (96.163 kg)   Body mass index is 35.67 kg/(m^2). Ideal Body Weight: Weight in (lb) to have BMI = 25: 148.3  GEN: WDWN, NAD, Non-toxic, A & O x 3 HEENT: Atraumatic, Normocephalic. Neck supple. No masses, No LAD. Ears and Nose: No external deformity. CV: RRR, No  M/G/R. No JVD. No thrill. No extra heart sounds. PULM: CTA B, no wheezes, crackles, rhonchi. No retractions. No resp. distress. No accessory muscle use. ABD: S, NT, ND, +BS. No rebound. No HSM. EXTR: No c/c/e NEURO Normal gait.  PSYCH: Normally interactive. Conversant. Not depressed or anxious appearing.  Calm demeanor.  SKIN:: generlized hivesa   Assessment and Plan: Continue prednisone Follow up in one week. Signed,  Phillips OdorJeffery Jesten Cappuccio, MD

## 2013-09-21 NOTE — Patient Instructions (Signed)
Hives Hives are itchy, red, swollen areas of the skin. They can vary in size and location on your body. Hives can come and go for hours or several days (acute hives) or for several weeks (chronic hives). Hives do not spread from person to person (noncontagious). They may get worse with scratching, exercise, and emotional stress. CAUSES   Allergic reaction to food, additives, or drugs.  Infections, including the common cold.  Illness, such as vasculitis, lupus, or thyroid disease.  Exposure to sunlight, heat, or cold.  Exercise.  Stress.  Contact with chemicals. SYMPTOMS   Red or white swollen patches on the skin. The patches may change size, shape, and location quickly and repeatedly.  Itching.  Swelling of the hands, feet, and face. This may occur if hives develop deeper in the skin. DIAGNOSIS  Your caregiver can usually tell what is wrong by performing a physical exam. Skin or blood tests may also be done to determine the cause of your hives. In some cases, the cause cannot be determined. TREATMENT  Mild cases usually get better with medicines such as antihistamines. Severe cases may require an emergency epinephrine injection. If the cause of your hives is known, treatment includes avoiding that trigger.  HOME CARE INSTRUCTIONS   Avoid causes that trigger your hives.  Take antihistamines as directed by your caregiver to reduce the severity of your hives. Non-sedating or low-sedating antihistamines are usually recommended. Do not drive while taking an antihistamine.  Take any other medicines prescribed for itching as directed by your caregiver.  Wear loose-fitting clothing.  Keep all follow-up appointments as directed by your caregiver. SEEK MEDICAL CARE IF:   You have persistent or severe itching that is not relieved with medicine.  You have painful or swollen joints. SEEK IMMEDIATE MEDICAL CARE IF:   You have a fever.  Your tongue or lips are swollen.  You have  trouble breathing or swallowing.  You feel tightness in the throat or chest.  You have abdominal pain. These problems may be the first sign of a life-threatening allergic reaction. Call your local emergency services (911 in [place] MAKE SURE YOU:   Understand these instructions.  Will watch your condition.  Will get help right away if you are not doing well or get worse. Document Released: 02/21/2005 Document Revised: 02/26/2013 Document Reviewed: 05/17/2011 ExitCare Patient Information 2015 [employer]. This information is not intended to replace advice given to you by your health care provider. Make sure you discuss any questions you have with your health care provider.  

## 2013-12-31 ENCOUNTER — Telehealth: Payer: Self-pay | Admitting: Family Medicine

## 2013-12-31 NOTE — Telephone Encounter (Signed)
Please review patients lab work in [facility] folder.

## 2014-02-07 NOTE — Telephone Encounter (Signed)
Call pt, [facility] sent an elevated hgb A1C on pt to us, are they managing this, or would they like us to? If us, needs f u ov and repeat hgb A1C to disc early diabetes.carolyn fine

## 2014-02-10 NOTE — Telephone Encounter (Signed)
Patient notified and verbalized understanding. I transferred her up front to schedule f/u OV with Eber Jonesarolyn to recheck A1C

## 2014-03-17 ENCOUNTER — Ambulatory Visit: Payer: Self-pay | Admitting: Nurse Practitioner

## 2014-04-14 ENCOUNTER — Encounter: Payer: Self-pay | Admitting: Nurse Practitioner

## 2014-04-14 ENCOUNTER — Ambulatory Visit (INDEPENDENT_AMBULATORY_CARE_PROVIDER_SITE_OTHER): Payer: BLUE CROSS/BLUE SHIELD | Admitting: Nurse Practitioner

## 2014-04-14 VITALS — BP 120/72 | Ht 64.65 in | Wt 218.0 lb

## 2014-04-14 DIAGNOSIS — L700 Acne vulgaris: Secondary | ICD-10-CM

## 2014-04-14 DIAGNOSIS — R7303 Prediabetes: Secondary | ICD-10-CM

## 2014-04-14 DIAGNOSIS — R7309 Other abnormal glucose: Secondary | ICD-10-CM

## 2014-04-14 DIAGNOSIS — E282 Polycystic ovarian syndrome: Secondary | ICD-10-CM

## 2014-04-14 LAB — POCT GLYCOSYLATED HEMOGLOBIN (HGB A1C): HEMOGLOBIN A1C: 6.1

## 2014-04-14 MED ORDER — METFORMIN HCL 500 MG PO TABS: 500.0000 mg | ORAL_TABLET | Freq: Two times a day (BID) | ORAL | Status: DC

## 2014-04-14 MED ORDER — ADAPALENE 0.1 % EX CREA: TOPICAL_CREAM | CUTANEOUS | Status: AC

## 2014-04-14 MED ORDER — ADAPALENE 0.1 % EX CREA: TOPICAL_CREAM | CUTANEOUS | Status: DC

## 2014-04-14 NOTE — Patient Instructions (Addendum)
PCOS Acanthosis nigricans Loratadine 10 mg during the day Benadryl 25 mg at night Zantac 150 mg once day Benzoyl peroxide mixed with prescription gel

## 2014-04-15 ENCOUNTER — Encounter: Payer: Self-pay | Admitting: Nurse Practitioner

## 2014-04-15 DIAGNOSIS — E282 Polycystic ovarian syndrome: Secondary | ICD-10-CM | POA: Insufficient documentation

## 2014-04-15 DIAGNOSIS — R7303 Prediabetes: Secondary | ICD-10-CM | POA: Insufficient documentation

## 2014-04-15 DIAGNOSIS — L700 Acne vulgaris: Secondary | ICD-10-CM | POA: Insufficient documentation

## 2014-04-15 NOTE — Progress Notes (Signed)
Subjective:  Presents for recheck on her hemoglobin A1c which was slightly elevated at her annual physical with gynecology. Patient gave birth in 2014. Had gestational diabetes. Lost most of her pregnancy weight but due to busy schedule, has gained back all of that weight plus more. Her maternal grandmother and great-grandmother are diabetic. Also noticed an acne outbreak after gaining her weight. Mild excessive hair growth only on the face and neck area. No difficulty conceiving. Limited time to exercise due to working 2 jobs and going to school. She works as a Scientist, physiologicalreceptionist and one job, her second job is more active. Has noticed more skin tags around the neck area as well.  Objective:   BP 120/72 mmHg  Ht 5' 4.65" (1.642 m)  Wt 218 lb (98.884 kg)  BMI 36.68 kg/m2 NAD. Alert, oriented. Lungs clear. Heart regular rate rhythm. Multiple tiny lesions in various stages of healing noted particularly around the chin and forehead. Faint excessive hair growth noted along the sides of the face and neck area or anterior. Acanthosis nigricans noted around the neck. Large waist circumference. Results for orders placed or performed in visit on 04/14/14  POCT glycosylated hemoglobin (Hb A1C)  Result Value Ref Range   Hemoglobin A1C 6.1    Assessment: Prediabetes - Plan: POCT glycosylated hemoglobin (Hb A1C)  Acne vulgaris  Morbid obesity  PCOS (polycystic ovarian syndrome)  Plan: Lengthy discussion about her diagnoses. Discussed importance of weight loss particularly around the abdominal area. Limit sugar and simple carbs. Stay as active as possible. Start metformin 500 mg with evening meal and once this is tolerated, increase to twice a day. Patient to get a copy of her lab work done through gynecology for our office. Differin gel as directed for acne. Meds ordered this encounter  Medications  . MONO-LINYAH 0.25-35 MG-MCG tablet    Sig:     Refill:  0  . metFORMIN (GLUCOPHAGE) 500 MG tablet    Sig:  Take 1 tablet (500 mg total) by mouth 2 (two) times daily with a meal.    Dispense:  60 tablet    Refill:  5    Order Specific Question:  Supervising Provider    Answer:  Merlyn AlbertLUKING, WILLIAM S [[id number]]  . DISCONTD: adapalene (DIFFERIN) 0.1 % cream    Sig: Apply to affected area nightly    Dispense:  45 g    Refill:  0    Order Specific Question:  Supervising Provider    Answer:  Merlyn AlbertLUKING, WILLIAM S [[id number]]  . adapalene (DIFFERIN) 0.1 % cream    Sig: Apply to affected area nightly    Dispense:  45 g    Refill:  0    Dispense as written and Name brand medically necessary   Return in about 3 months (around 07/13/2014) for recheck.

## 2014-05-17 ENCOUNTER — Encounter: Payer: Self-pay | Admitting: *Deleted

## 2014-07-09 ENCOUNTER — Encounter: Payer: Self-pay | Admitting: Nurse Practitioner

## 2014-07-09 ENCOUNTER — Ambulatory Visit: Payer: BLUE CROSS/BLUE SHIELD | Admitting: Nurse Practitioner

## 2014-07-09 ENCOUNTER — Ambulatory Visit (INDEPENDENT_AMBULATORY_CARE_PROVIDER_SITE_OTHER): Payer: BLUE CROSS/BLUE SHIELD | Admitting: Nurse Practitioner

## 2014-07-09 VITALS — BP 116/78 | Ht 64.65 in | Wt 226.6 lb

## 2014-07-09 DIAGNOSIS — R7303 Prediabetes: Secondary | ICD-10-CM

## 2014-07-09 DIAGNOSIS — E282 Polycystic ovarian syndrome: Secondary | ICD-10-CM

## 2014-07-09 DIAGNOSIS — R7309 Other abnormal glucose: Secondary | ICD-10-CM | POA: Diagnosis not present

## 2014-07-09 MED ORDER — PHENTERMINE HCL 37.5 MG PO TABS: 37.5000 mg | ORAL_TABLET | Freq: Every day | ORAL | Status: DC

## 2014-07-11 ENCOUNTER — Encounter: Payer: Self-pay | Admitting: Nurse Practitioner

## 2014-07-11 NOTE — Progress Notes (Signed)
Subjective:  Presents for recheck on her prediabetes. Has not done as well with her diet lately. Works 2 jobs, working a lot of hours. One of her jobs is very active. No chest pain shortness of breath or edema. Compliant with metformin taking twice a day.  Objective:   BP 116/78 mmHg  Ht 5' 4.65" (1.642 m)  Wt 226 lb 9.6 oz (102.785 kg)  BMI 38.12 kg/m2  LMP 07/03/2014 NAD. Alert, oriented. Lungs clear. Heart regular rate rhythm. Significant central obesity. Lower extremities no edema.  Assessment:  Problem List Items Addressed This Visit      Endocrine   Prediabetes - Primary   PCOS (polycystic ovarian syndrome)     Plan:  Meds ordered this encounter  Medications  . phentermine (ADIPEX-P) 37.5 MG tablet    Sig: Take 1 tablet (37.5 mg total) by mouth daily before breakfast.    Dispense:  30 tablet    Refill:  0    Order Specific Question:  Supervising Provider    Answer:  Riccardo DubinLUKING, WILLIAM S [[id number]]   Reviewed potential adverse effects of phentermine, DC med if any problems. Encouraged healthy diet and regular activity. Recheck in one month if she wishes to continue the phentermine. Discussed importance of weight loss particularly around the abdominal area regarding impact on PCOS and risk of diabetes.

## 2014-08-13 ENCOUNTER — Ambulatory Visit: Payer: BLUE CROSS/BLUE SHIELD | Admitting: Nurse Practitioner

## 2014-12-10 ENCOUNTER — Encounter: Payer: Self-pay | Admitting: Nurse Practitioner

## 2014-12-10 ENCOUNTER — Ambulatory Visit (INDEPENDENT_AMBULATORY_CARE_PROVIDER_SITE_OTHER): Payer: BLUE CROSS/BLUE SHIELD | Admitting: Nurse Practitioner

## 2014-12-10 VITALS — BP 108/82 | Temp 98.2°F | Ht 64.65 in | Wt 233.5 lb

## 2014-12-10 DIAGNOSIS — L02419 Cutaneous abscess of limb, unspecified: Secondary | ICD-10-CM

## 2014-12-10 DIAGNOSIS — L03119 Cellulitis of unspecified part of limb: Secondary | ICD-10-CM

## 2014-12-10 MED ORDER — CLINDAMYCIN HCL 300 MG PO CAPS: 300.0000 mg | ORAL_CAPSULE | Freq: Three times a day (TID) | ORAL | Status: DC

## 2014-12-10 NOTE — Progress Notes (Signed)
Subjective:  Presents for complaints of a knot in the right upper thigh area near the groin that began about a week ago. Was very large one point, has ruptured within the past 48 hours and drained a large amount of purulent material. Size and pain much improved. No fever.  Objective:   BP 108/82 mmHg  Temp(Src) 98.2 F (36.8 C)  Ht 5' 4.65" (1.642 m)  Wt 233 lb 8 oz (105.915 kg)  BMI 39.28 kg/m2 NAD. Alert, oriented. Superficial open area noted in the mid right intertriginous area with a firm area going into the right upper thigh approximately half to 1 cm. Bloody purulent drainage noted. Culture obtained. Mildly tender to palpation.  Assessment: Cellulitis and abscess of leg - Plan: Wound culture  Plan:  Meds ordered this encounter  Medications  . clindamycin (CLEOCIN) 300 MG capsule    Sig: Take 1 capsule (300 mg total) by mouth 3 (three) times daily.    Dispense:  21 capsule    Refill:  0    Order Specific Question:  Supervising Provider    Answer:  Merlyn Albert [[id number]]   Recommend warm compresses or sitz baths. Expect continued gradual resolution over the next week. Call back if worsens or persists. Warning signs reviewed.

## 2014-12-12 ENCOUNTER — Telehealth: Payer: Self-pay | Admitting: Family Medicine

## 2014-12-12 NOTE — Telephone Encounter (Signed)
Pt calling about he wound culture results

## 2014-12-13 LAB — WOUND CULTURE: Organism ID, Bacteria: NONE SEEN

## 2014-12-15 NOTE — Telephone Encounter (Signed)
See lab report  

## 2015-02-05 ENCOUNTER — Ambulatory Visit (INDEPENDENT_AMBULATORY_CARE_PROVIDER_SITE_OTHER): Payer: BLUE CROSS/BLUE SHIELD | Admitting: Family Medicine

## 2015-02-05 VITALS — Temp 98.3°F | Ht 64.5 in | Wt 231.8 lb

## 2015-02-05 DIAGNOSIS — B9689 Other specified bacterial agents as the cause of diseases classified elsewhere: Secondary | ICD-10-CM

## 2015-02-05 DIAGNOSIS — J019 Acute sinusitis, unspecified: Secondary | ICD-10-CM | POA: Diagnosis not present

## 2015-02-05 MED ORDER — AMOXICILLIN 500 MG PO TABS: 500.0000 mg | ORAL_TABLET | Freq: Three times a day (TID) | ORAL | Status: DC

## 2015-02-05 NOTE — Progress Notes (Signed)
   Subjective:    Patient ID: Melanie Mccoy, female    DOB: [birth year], 24 y.o.   MRN: [id number]  Cough This is a new problem. The current episode started in the past 7 days. Associated symptoms include nasal congestion and rhinorrhea. Pertinent negatives include no chest pain, ear pain, fever, shortness of breath or wheezing.  patient tried various OTC measures without success Patient Review of Systems  Constitutional: Negative for fever and activity change.  HENT: Positive for congestion and rhinorrhea. Negative for ear pain.   Eyes: Negative for discharge.  Respiratory: Positive for cough. Negative for shortness of breath and wheezing.   Cardiovascular: Negative for chest pain.       Objective:   Physical Exam  Constitutional: She appears well-developed.  HENT:  Head: Normocephalic.  Nose: Nose normal.  Mouth/Throat: Oropharynx is clear and moist. No oropharyngeal exudate.  Neck: Neck supple.  Cardiovascular: Normal rate and normal heart sounds.   No murmur heard. Pulmonary/Chest: Effort normal and breath sounds normal. She has no wheezes.  Lymphadenopathy:    She has no cervical adenopathy.  Skin: Skin is warm and dry.  Nursing note and vitals reviewed.   Tender sinus      Assessment & Plan:  Viral syndrome secondary rhinosinusitis antibiotics prescribed warning signs discussed follow-up of problems

## 2015-10-19 ENCOUNTER — Encounter: Payer: Self-pay | Admitting: Family Medicine

## 2015-10-19 ENCOUNTER — Ambulatory Visit (INDEPENDENT_AMBULATORY_CARE_PROVIDER_SITE_OTHER): Payer: 59 | Admitting: Family Medicine

## 2015-10-19 VITALS — BP 120/80 | Temp 98.2°F | Ht 64.5 in | Wt 233.4 lb

## 2015-10-19 DIAGNOSIS — L03311 Cellulitis of abdominal wall: Secondary | ICD-10-CM

## 2015-10-19 MED ORDER — HYDROCODONE-ACETAMINOPHEN 5-325 MG PO TABS: ORAL_TABLET | ORAL | 0 refills | Status: DC

## 2015-10-19 MED ORDER — DOXYCYCLINE HYCLATE 100 MG PO CAPS: ORAL_CAPSULE | ORAL | 0 refills | Status: DC

## 2015-10-19 NOTE — Progress Notes (Signed)
   Subjective:    Patient ID: Melanie Mccoy, female    DOB: Oct 25, 1990, 25 y.o.   MRN: [id number]  Abscess  This is a new problem. The current episode started in the past 7 days. She has tried NSAIDs (Ibuprofen ) for the symptoms.    Patient developed some hives today. Pruritic in nature. Has never had difficulty with Advil before.  No high fever   Patient in today for abscess to inner right thigh. Also has c/o hives.   States no other concerns this visit.  Cough and congested and throat itching  Headache mild ein nature ''  Review of Systems No headache, no major weight loss or weight gain, no chest pain no back pain abdominal pain no change in bowel habits complete ROS otherwise negative      Objective:   Physical Exam Alert vital stable hydration good skin several patches of hives noted on arm lungs clear heart rare rhythm right groin palpable erythematous patch no central fluctuance some endurance very tender       Assessment & Plan:  Impression right groin early abscess discussed local measures discussed doxycycline twice a day for 10 days. #2 hives possibly associated with Advil hard to tell for sure. Use Benadryl avoid Advil for now

## 2016-01-14 ENCOUNTER — Ambulatory Visit (HOSPITAL_COMMUNITY)
Admission: RE | Admit: 2016-01-14 | Discharge: 2016-01-14 | Disposition: A | Discharge status: Home / Self Care | Payer: BLUE CROSS/BLUE SHIELD | Source: Ambulatory Visit | Attending: Family Medicine | Admitting: Family Medicine

## 2016-01-14 ENCOUNTER — Ambulatory Visit (INDEPENDENT_AMBULATORY_CARE_PROVIDER_SITE_OTHER): Payer: 59

## 2016-01-14 ENCOUNTER — Ambulatory Visit: Payer: 59 | Admitting: Family Medicine

## 2016-01-14 ENCOUNTER — Ambulatory Visit (INDEPENDENT_AMBULATORY_CARE_PROVIDER_SITE_OTHER): Payer: BLUE CROSS/BLUE SHIELD | Admitting: Family Medicine

## 2016-01-14 VITALS — BP 112/82 | HR 102 | Temp 98.6°F | Resp 16 | Ht 65.5 in | Wt 237.0 lb

## 2016-01-14 DIAGNOSIS — R109 Unspecified abdominal pain: Secondary | ICD-10-CM | POA: Diagnosis present

## 2016-01-14 DIAGNOSIS — R600 Localized edema: Secondary | ICD-10-CM | POA: Diagnosis not present

## 2016-01-14 DIAGNOSIS — Z1329 Encounter for screening for other suspected endocrine disorder: Secondary | ICD-10-CM

## 2016-01-14 DIAGNOSIS — E119 Type 2 diabetes mellitus without complications: Secondary | ICD-10-CM

## 2016-01-14 DIAGNOSIS — D72829 Elevated white blood cell count, unspecified: Secondary | ICD-10-CM | POA: Diagnosis not present

## 2016-01-14 LAB — POC MICROSCOPIC URINALYSIS (UMFC): Mucus: ABSENT

## 2016-01-14 LAB — POCT URINALYSIS DIP (MANUAL ENTRY)
Bilirubin, UA: NEGATIVE
Glucose, UA: >=1000 — AB
LEUKOCYTES UA: NEGATIVE
NITRITE UA: NEGATIVE
PH UA: 6
Protein Ur, POC: NEGATIVE
RBC UA: NEGATIVE
Spec Grav, UA: 1.02
Urobilinogen, UA: 0.2

## 2016-01-14 LAB — POCT CBC
GRANULOCYTE PERCENT: 79.8 % (ref 37–80)
HCT, POC: 42.1 % (ref 37.7–47.9)
HEMOGLOBIN: 15.2 g/dL (ref 12.2–16.2)
Lymph, poc: 1.5 (ref 0.6–3.4)
MCH: 32.1 pg — AB (ref 27–31.2)
MCHC: 36 g/dL — AB (ref 31.8–35.4)
MCV: 89 fL (ref 80–97)
MID (CBC): 1.2 — AB (ref 0–0.9)
MPV: 7.5 fL (ref 0–99.8)
POC Granulocyte: 10.8 — AB (ref 2–6.9)
POC LYMPH PERCENT: 11.2 %L (ref 10–50)
POC MID %: 9 % (ref 0–12)
Platelet Count, POC: 228 10*3/uL (ref 142–424)
RBC: 4.73 M/uL (ref 4.04–5.48)
RDW, POC: 13.4 %
WBC: 13.5 10*3/uL — AB (ref 4.6–10.2)

## 2016-01-14 LAB — POCT GLYCOSYLATED HEMOGLOBIN (HGB A1C): HEMOGLOBIN A1C: 10.4

## 2016-01-14 LAB — POCT URINE PREGNANCY: PREG TEST UR: NEGATIVE

## 2016-01-14 LAB — GLUCOSE, POCT (MANUAL RESULT ENTRY): POC Glucose: 297 mg/dl — AB (ref 70–99)

## 2016-01-14 MED ORDER — METRONIDAZOLE 500 MG PO TABS: 500.0000 mg | ORAL_TABLET | Freq: Two times a day (BID) | ORAL | 0 refills | Status: DC

## 2016-01-14 MED ORDER — METFORMIN HCL 500 MG PO TABS: 500.0000 mg | ORAL_TABLET | Freq: Two times a day (BID) | ORAL | 3 refills | Status: DC

## 2016-01-14 MED ORDER — RANITIDINE HCL 150 MG PO TABS: 150.0000 mg | ORAL_TABLET | Freq: Two times a day (BID) | ORAL | 0 refills | Status: DC

## 2016-01-14 MED ORDER — IOPAMIDOL (ISOVUE-300) INJECTION 61%
100.0000 mL | Freq: Once | INTRAVENOUS | Status: AC | PRN
  Administered 2016-01-14: 100 mL via INTRAVENOUS

## 2016-01-14 MED ORDER — IOPAMIDOL (ISOVUE-300) INJECTION 61%
30.0000 mL | Freq: Once | INTRAVENOUS | Status: AC | PRN
  Administered 2016-01-14: 30 mL via ORAL

## 2016-01-14 MED ORDER — AZITHROMYCIN 250 MG PO TABS: ORAL_TABLET | ORAL | 0 refills | Status: DC

## 2016-01-14 NOTE — Patient Instructions (Addendum)
Report now to [facility] to Register at Radiology.  Do not have anything to eat or drink on your way.    IF you received an x-ray today, you will receive an invoice from [facility]. Please contact [facility] at [phone] with questions or concerns regarding your invoice.   IF you received labwork today, you will receive an invoice from [facility]. Please contact [facility] at [phone] with questions or concerns regarding your invoice.   Our billing staff will not be able to assist you with questions regarding bills from these companies.  You will be contacted with the lab results as soon as they are available. The fastest way to get your results is to activate your My Chart account. Instructions are located on the last page of this paperwork. If you have not heard from us regarding the results in 2 weeks, please contact this office.

## 2016-01-14 NOTE — Progress Notes (Signed)
Patient ID: Mardi MainlandKayla B Slade Sams, female    DOB: 1990-04-08, 25 y.o.   MRN: [id number]  PCP: Lubertha SouthSteve Luking, MD  Chief Complaint  Patient presents with  . Abdominal Pain    RUQ, getting worse over the week    Subjective:   HPI 25 year old female presents for evaluation of worsening RUQ abdominal pain x 4 days. [facility] She reports pain initially began as a burning sensation from the inside of her abdomen. Yesterday, the pain became sharp and aching. Pain improves with lying down and increases with heavy meal intake. Pain is persistent and has not completely resolved. Reports feeling light headed and queasy without vomiting. Denies fever.   Took Tums which improved abdominal pain. Denies diarrhea and had some constipation at the beginning of the week which was relieved with Colace. Denies any known gallstones or recent changes in diet. She reports being previously placed on Metformin for a diagnosis of prediabetes stopped medication many months ago due to the GI side effects. Never checks her blood sugar and is uncertain if it's been elevated or not.   Social History   Social History  . Marital status: Single    Spouse name: N/A  . Number of children: N/A  . Years of education: N/A   Occupational History  . Not on file.   Social History Main Topics  . Smoking status: Never Smoker  . Smokeless tobacco: Never Used  . Alcohol use No  . Drug use: No  . Sexual activity: Yes   Other Topics Concern  . Not on file   Social History Narrative  . No narrative on file   Family History  Problem Relation Age of Onset  . Cancer Other   . Heart disease Other   . Diabetes Other   . Diabetes Maternal Grandfather   . Diabetes Mother   . Diabetes Father     Review of Systems See HPI    Patient Active Problem List   Diagnosis Date Noted  . Prediabetes 04/15/2014  . Acne vulgaris 04/15/2014  . Morbid obesity (HCC) 04/15/2014  . PCOS (polycystic ovarian  syndrome) 04/15/2014  . Status post primary low transverse cesarean section 11/22/2012     Prior to Admission medications   Medication Sig Start Date End Date Taking? Authorizing Provider  HYDROcodone-acetaminophen (NORCO/VICODIN) 5-325 MG tablet Take 1 tablet by mouth every 6 hours as needed for pain Patient not taking: Reported on 01/14/2016 10/19/15   Merlyn AlbertWilliam S Luking, MD  metFORMIN (GLUCOPHAGE) 500 MG tablet Take 1 tablet (500 mg total) by mouth 2 (two) times daily with a meal. Patient not taking: Reported on 01/14/2016 04/14/14   Campbell Richesarolyn C Hoskins, NP  [facility] 0.25-35 MG-MCG tablet  03/12/14   Historical Provider, MD  phentermine (ADIPEX-P) 37.5 MG tablet Take 1 tablet (37.5 mg total) by mouth daily before breakfast. Patient not taking: Reported on 01/14/2016 07/09/14   Campbell Richesarolyn C Hoskins, NP     Allergies  Allergen Reactions  . Bee Venom Swelling    Progressively worse with each sting  . Nickel Rash       Objective:  Physical Exam  Constitutional: She is oriented to person, place, and time. She appears well-developed and well-nourished.  HENT:  Head: Normocephalic and atraumatic.  Eyes: Pupils are equal, round, and reactive to light.  Neck: Normal range of motion.  Cardiovascular: Normal rate, regular rhythm, normal heart sounds and intact distal pulses.   Pulmonary/Chest: Effort normal and  breath sounds normal.  Abdominal: Soft. She exhibits no distension and no mass. Bowel sounds are decreased. There is no hepatosplenomegaly. There is tenderness in the right upper quadrant and epigastric area. There is no CVA tenderness.    Neurological: She is alert and oriented to person, place, and time.  Skin: Skin is warm and dry.  Psychiatric: She has a normal mood and affect. Her behavior is normal. Judgment and thought content normal.       Vitals:   01/14/16 1401  BP: 112/82  Pulse: (!) 102  Resp: 16  Temp: 98.6 F (37 C)    Ct Abdomen Pelvis W Contrast  Result Date:  01/14/2016 CLINICAL DATA:  Abdominal pain EXAM: CT ABDOMEN AND PELVIS WITH CONTRAST TECHNIQUE: Multidetector CT imaging of the abdomen and pelvis was performed using the standard protocol following bolus administration of intravenous contrast. CONTRAST:  30mL ISOVUE-300 IOPAMIDOL (ISOVUE-300) INJECTION 61%, ISOVUE-300 IOPAMIDOL (ISOVUE-300) INJECTION 61% COMPARISON:  None. FINDINGS: Lower chest:  Unremarkable. Hepatobiliary: The liver shows diffusely decreased attenuation in a geographic distribution suggesting steatosis. There is no evidence for gallstones, gallbladder wall thickening, or pericholecystic fluid. No intrahepatic or extrahepatic biliary dilation. Pancreas: No focal mass lesion. No dilatation of the main duct. No intraparenchymal cyst. Edema/ inflammation is seen in the retroperitoneal tissues in the region of the uncinate process of the pancreas. Spleen: No splenomegaly. No focal mass lesion. Adrenals/Urinary Tract: No adrenal nodule or mass. Kidneys are unremarkable. No evidence for hydroureter. The urinary bladder appears normal for the degree of distention. Stomach/Bowel: Stomach is nondistended. No gastric wall thickening. No evidence of outlet obstruction. Retroperitoneal edema/ inflammation is seen around the descending and transverse segments of the duodenum without substantial wall thickening. No focal contained retroperitoneal fluid collection. No evidence for extraluminal free gas. No small bowel wall thickening. No small bowel dilatation. The terminal ileum is normal. The appendix is normal. No gross colonic mass. No colonic wall thickening. No substantial diverticular change. Small area of soft tissue stranding adjacent to the distal descending colon may be related to scarring from prior epiploic appendagitis or omental infarct. Vascular/Lymphatic: No abdominal aortic aneurysm. No abdominal aortic atherosclerotic calcification. Small retroperitoneal lymph nodes are seen around the  pancreatic head and duodenum. No pelvic sidewall lymphadenopathy. Reproductive: The uterus has normal CT imaging appearance. There is no adnexal mass. Other: No intraperitoneal free fluid. Musculoskeletal: Bone windows reveal no worrisome lytic or sclerotic osseous lesions. IMPRESSION: 1. Retroperitoneal edema/inflammation appears to be most closely associated with the duodenum although pancreatic source not entirely excluded. Main differential considerations would include duodenitis, peptic ulcer disease, or groove pancreatitis. No evidence for contained fluid collection/abscess or extraluminal gas to suggest perforation. Electronically Signed   By: Kennith Center M.D.   On: 01/14/2016 17:40   Dg Abd 2 Views  Result Date: 01/14/2016 CLINICAL DATA:  Abdominal pain 4 days EXAM: ABDOMEN - 2 VIEW COMPARISON:  None. FINDINGS: The bowel gas pattern is normal. There is no evidence of free air. No radio-opaque calculi or other significant radiographic abnormality is seen. IMPRESSION: Negative. Electronically Signed   By: Elige Ko   On: 01/14/2016 15:00   Assessment & Plan:  1. Abdominal pain, unspecified abdominal location - POCT urinalysis dipstick - POCT Microscopic Urinalysis ([facility]) - POCT urine pregnancy - POCT CBC - DG Abd 2 Views; Future - Lipase - Amylase - CT Abdomen Pelvis W Contrast  Plan: Peptic ulcer disease vs. Duodenum irritation vs. Pancreatic involvement. Lipase and Amylase pending results. Will  treat as Peptic Ulcer disease -Start Azithromycin Take 2 tabs x 1 dose, then 1 tab every day for x 4 day -Metronidazole, Flagyl 500 mg twice daily for 7 day -Ranitidine 150 mg twice daily   2. Type 2 Diabetes, uncontrolled - POCT glycosylated hemoglobin (Hb A1C) - POCT glucose (manual entry) - COMPLETE METABOLIC PANEL WITH GFR Plan: -Start Canagliflozin (Invokana) 200 mg daily before breakfast Return for follow-up in 3 months.  3. Screening for thyroid disorder - Thyroid Panel  With TSH - Lipid panel  4. Leukocytosis, unspecified type   Follow-up if symptoms persists.  Godfrey PickKimberly S. Tiburcio PeaHarris, MSN, FNP-C [facility]

## 2016-01-15 ENCOUNTER — Telehealth: Payer: Self-pay

## 2016-01-15 ENCOUNTER — Telehealth: Payer: Self-pay | Admitting: Family Medicine

## 2016-01-15 LAB — LIPID PANEL
CHOL/HDL RATIO: 4.9 ratio (ref <5.0)
CHOLESTEROL: 207 mg/dL — AB (ref <200)
HDL: 42 mg/dL — ABNORMAL LOW (ref >50)
LDL Cholesterol: 112 mg/dL — ABNORMAL HIGH
Triglycerides: 265 mg/dL — ABNORMAL HIGH (ref <150)
VLDL: 53 mg/dL — AB (ref <30)

## 2016-01-15 LAB — COMPLETE METABOLIC PANEL WITH GFR
ALBUMIN: 4.1 g/dL (ref 3.6–5.1)
ALK PHOS: 82 U/L (ref 33–115)
ALT: 32 U/L — ABNORMAL HIGH (ref 6–29)
AST: 20 U/L (ref 10–30)
BUN: 12 mg/dL (ref 7–25)
CALCIUM: 10 mg/dL (ref 8.6–10.2)
CHLORIDE: 100 mmol/L (ref 98–110)
CO2: 23 mmol/L (ref 20–31)
Creat: 0.77 mg/dL (ref 0.50–1.10)
GFR, Est Non African American: >89 mL/min (ref >=60)
Glucose, Bld: 349 mg/dL — ABNORMAL HIGH (ref 65–99)
POTASSIUM: 4.2 mmol/L (ref 3.5–5.3)
Sodium: 139 mmol/L (ref 135–146)
Total Bilirubin: 0.4 mg/dL (ref 0.2–1.2)
Total Protein: 6.8 g/dL (ref 6.1–8.1)

## 2016-01-15 LAB — AMYLASE: AMYLASE: 31 U/L (ref 0–105)

## 2016-01-15 LAB — THYROID PANEL WITH TSH
Free Thyroxine Index: 2.3 (ref 1.4–3.8)
T3 UPTAKE: 25 % (ref 22–35)
T4 TOTAL: 9 ug/dL (ref 4.5–12.0)
TSH: 2 m[IU]/L

## 2016-01-15 LAB — LIPASE: LIPASE: 31 U/L (ref 7–60)

## 2016-01-15 MED ORDER — CANAGLIFLOZIN 100 MG PO TABS: 100.0000 mg | ORAL_TABLET | Freq: Every day | ORAL | 0 refills | Status: DC

## 2016-01-15 NOTE — Telephone Encounter (Signed)
Per CT results I am treating patient for Peptic Ulcer Disease. Start  Flagyl x 7 days, azithromycin  X 5 days and ranidtine daily. Return for re-evaluation in 4 weeks or sooner if symptoms do not improve.

## 2016-01-15 NOTE — Telephone Encounter (Signed)
Pt called to get results of CT abdomen.  Discussed with Joaquin CourtsKimberly Harris NP She left message for pt - she is treating her for peptic ulcer disease.  Meds, including abx sent to her pharmacy.  She also sent in Invokana for DM since she has reviewed her kidney function.   Cala BradfordKimberly wants to recheck her in 4 weeks.   Pt verbalized understanding.

## 2016-01-19 DIAGNOSIS — E1165 Type 2 diabetes mellitus with hyperglycemia: Secondary | ICD-10-CM | POA: Insufficient documentation

## 2016-01-19 DIAGNOSIS — E119 Type 2 diabetes mellitus without complications: Secondary | ICD-10-CM | POA: Insufficient documentation

## 2016-02-17 ENCOUNTER — Ambulatory Visit (INDEPENDENT_AMBULATORY_CARE_PROVIDER_SITE_OTHER): Payer: BLUE CROSS/BLUE SHIELD | Admitting: Family Medicine

## 2016-02-17 ENCOUNTER — Telehealth: Payer: Self-pay

## 2016-02-17 VITALS — BP 134/72 | HR 87 | Temp 98.1°F | Ht 65.5 in | Wt 232.0 lb

## 2016-02-17 DIAGNOSIS — E1169 Type 2 diabetes mellitus with other specified complication: Secondary | ICD-10-CM | POA: Diagnosis not present

## 2016-02-17 LAB — POCT URINALYSIS DIP (MANUAL ENTRY)
Bilirubin, UA: NEGATIVE
Blood, UA: NEGATIVE
GLUCOSE UA: NEGATIVE
Ketones, POC UA: NEGATIVE
Leukocytes, UA: NEGATIVE
NITRITE UA: NEGATIVE
PROTEIN UA: NEGATIVE
SPEC GRAV UA: 1.015
UROBILINOGEN UA: 0.2
pH, UA: 5.5

## 2016-02-17 MED ORDER — METFORMIN HCL 500 MG PO TABS: 500.0000 mg | ORAL_TABLET | Freq: Two times a day (BID) | ORAL | 0 refills | Status: DC

## 2016-02-17 NOTE — Progress Notes (Signed)
Patient ID: Mardi MainlandKayla B Slade Sams, female    DOB: 1990/09/08, 25 y.o.   MRN: [id number]  PCP: Lubertha SouthSteve Luking, MD  Chief Complaint  Patient presents with  . Follow-up    Diabetes    Subjective:   HPI N[place], 25 year old female, present for diabetes follow-up.  Chronic problems include: Diabetes Mellitus Type 2, uncontrolled, morbid obesity, and PCOS. Never received prescription Invokana but did resume Metformin 500 mg twice daily. Since her last visit, she has lost 5lbs since resuming metformin. Denies any abdominal pain diarrhea, nausea, or vomiting. She doesn't routinely monitor her blood sugar at home.  Social History   Social History  . Marital status: Single    Spouse name: N/A  . Number of children: N/A  . Years of education: N/A   Occupational History  . Not on file.   Social History Main Topics  . Smoking status: Never Smoker  . Smokeless tobacco: Never Used  . Alcohol use No  . Drug use: No  . Sexual activity: Yes   Other Topics Concern  . Not on file   Social History Narrative  . No narrative on file   Family History  Problem Relation Age of Onset  . Cancer Other   . Heart disease Other   . Diabetes Other   . Diabetes Mother   . Diabetes Father   . Diabetes Maternal Grandfather    Review of Systems HPI  Patient Active Problem List   Diagnosis Date Noted  . Type 2 diabetes mellitus without complication, without long-term current use of insulin (HCC) 01/19/2016  . Acne vulgaris 04/15/2014  . Morbid obesity (HCC) 04/15/2014  . PCOS (polycystic ovarian syndrome) 04/15/2014  . Status post primary low transverse cesarean section 11/22/2012     Prior to Admission medications   Medication Sig Start Date End Date Taking? Authorizing Provider  azithromycin (ZITHROMAX) 250 MG tablet Take 2 tabs PO x 1 dose, then 1 tab PO QD x 4 days 01/14/16  Yes Doyle AskewKimberly Stephenia Cornell Bourbon, FNP  metFORMIN (GLUCOPHAGE) 500 MG tablet Take 500 mg by mouth 2 (two) times daily  with a meal.   Yes Historical Provider, MD  metroNIDAZOLE (FLAGYL) 500 MG tablet Take 1 tablet (500 mg total) by mouth 2 (two) times daily with a meal. DO NOT CONSUME ALCOHOL WHILE TAKING THIS MEDICATION. 01/14/16  Yes Doyle AskewKimberly Stephenia Betha Shadix, FNP  ranitidine (ZANTAC) 150 MG tablet Take 1 tablet (150 mg total) by mouth 2 (two) times daily. 01/14/16  Yes Doyle AskewKimberly Stephenia Treylin Burtch, FNP  canagliflozin [facility](INVOKANA) 100 MG TABS tablet Take 1 tablet (100 mg total) by mouth daily before breakfast. Patient not taking: Reported on 02/17/2016 01/15/16   Doyle AskewKimberly Stephenia Kessler Solly, FNP  HYDROcodone-acetaminophen (NORCO/VICODIN) 5-325 MG tablet Take 1 tablet by mouth every 6 hours as needed for pain Patient not taking: Reported on 02/17/2016 10/19/15   Merlyn AlbertWilliam S Luking, MD  [facility] 0.25-35 MG-MCG tablet  03/12/14   Historical Provider, MD  phentermine (ADIPEX-P) 37.5 MG tablet Take 1 tablet (37.5 mg total) by mouth daily before breakfast. Patient not taking: Reported on 02/17/2016 07/09/14   Campbell Richesarolyn C Hoskins, NP     Allergies  Allergen Reactions  . Bee Venom Swelling    Progressively worse with each sting  . Nickel Rash       Objective:  Physical Exam  Constitutional: She is oriented to person, place, and time. She appears well-developed and well-nourished.  HENT:  Head: Normocephalic and atraumatic.  Right Ear: External  ear normal.  Left Ear: External ear normal.  Nose: Nose normal.  Mouth/Throat: Oropharynx is clear and moist.  Eyes: EOM are normal. Pupils are equal, round, and reactive to light.  Neck: Normal range of motion. Neck supple.  Cardiovascular: Normal rate, regular rhythm, normal heart sounds and intact distal pulses.   Pulmonary/Chest: Effort normal and breath sounds normal.  Abdominal: Soft. Bowel sounds are normal.  Musculoskeletal: Normal range of motion.  Neurological: She is alert and oriented to person, place, and time.  Skin: Skin is warm and dry.  Psychiatric: She has a  normal mood and affect. Her behavior is normal. Judgment and thought content normal.   Today's Vitals   02/17/16 1746  BP: 134/72  Pulse: 87  Temp: 98.1 F (36.7 C)  TempSrc: Oral  SpO2: 97%  Weight: 232 lb (105.2 kg)  Height: 5' 5.5" (1.664 m)   Assessment & Plan:  1. Type 2 diabetes mellitus with other specified complication, without long-term current use of insulin (HCC), A1C 10.4  - POCT urinalysis dipstick-negative for glycosuria   Plan: -Metformin 500 mg twice daily  -Continue to increase physical activity in efforts to loose weight. -Return in 3 months for follow-up   Godfrey PickKimberly S. Tiburcio PeaHarris, MSN, FNP-C [facility]

## 2016-02-17 NOTE — Telephone Encounter (Signed)
Pt is needing to talk with Tiburcio PeaHarris about verification of infection and is she has ulcers  Best number [phone]

## 2016-02-17 NOTE — Patient Instructions (Addendum)
I refilled your metformin.  Return in 3 months for diabetes follow-up.   IF you received an x-ray today, you will receive an invoice from [facility]. Please contact [facility] at [phone] with questions or concerns regarding your invoice.   IF you received labwork today, you will receive an invoice from [facility]. Please contact [facility] at [phone] with questions or concerns regarding your invoice.   Our billing staff will not be able to assist you with questions regarding bills from these companies.  You will be contacted with the lab results as soon as they are available. The fastest way to get your results is to activate your My Chart account. Instructions are located on the last page of this paperwork. If you have not heard from us regarding the results in 2 weeks, please contact this office.

## 2016-02-22 NOTE — Telephone Encounter (Signed)
Attempted to call # on message as well as # on file. Both numbers unable to connect.

## 2016-02-23 NOTE — Telephone Encounter (Signed)
Tried to call pt again. Still get a busy signal.

## 2016-02-23 NOTE — Telephone Encounter (Signed)
#   below is disconnected. Ph # listed in EPIC got busy signal x 2, will call back later.

## 2016-03-01 NOTE — Telephone Encounter (Signed)
Sent unable to reach letter.

## 2016-03-21 ENCOUNTER — Ambulatory Visit (INDEPENDENT_AMBULATORY_CARE_PROVIDER_SITE_OTHER): Payer: BLUE CROSS/BLUE SHIELD | Admitting: Physician Assistant

## 2016-03-21 VITALS — BP 118/86 | HR 111 | Temp 98.1°F | Resp 16 | Ht 65.5 in | Wt 224.8 lb

## 2016-03-21 DIAGNOSIS — B9789 Other viral agents as the cause of diseases classified elsewhere: Secondary | ICD-10-CM | POA: Diagnosis not present

## 2016-03-21 DIAGNOSIS — J069 Acute upper respiratory infection, unspecified: Secondary | ICD-10-CM

## 2016-03-21 DIAGNOSIS — E119 Type 2 diabetes mellitus without complications: Secondary | ICD-10-CM | POA: Diagnosis not present

## 2016-03-21 LAB — GLUCOSE, POCT (MANUAL RESULT ENTRY): POC Glucose: 202 mg/dl — AB (ref 70–99)

## 2016-03-21 MED ORDER — GUAIFENESIN ER 1200 MG PO TB12: 1.0000 | ORAL_TABLET | Freq: Two times a day (BID) | ORAL | 1 refills | Status: DC | PRN

## 2016-03-21 MED ORDER — IPRATROPIUM BROMIDE 0.03 % NA SOLN
2.0000 | Freq: Two times a day (BID) | NASAL | 0 refills | Status: DC
Start: 2016-03-21 — End: 2016-06-04

## 2016-03-21 NOTE — Patient Instructions (Addendum)
Upper Respiratory Infection, Adult Most upper respiratory infections (URIs) are caused by a virus. A URI affects the nose, throat, and upper air passages. The most common type of URI is often called "the common cold." Follow these instructions at home:  Take medicines only as told by your doctor.  Gargle warm saltwater or take cough drops to comfort your throat as told by your doctor.  Use a warm mist humidifier or inhale steam from a shower to increase air moisture. This may make it easier to breathe.  Drink enough fluid to keep your pee (urine) clear or pale yellow.  Eat soups and other clear broths.  Have a healthy diet.  Rest as needed.  Go back to work when your fever is gone or your doctor says it is okay.  You may need to stay home longer to avoid giving your URI to others.  You can also wear a face mask and wash your hands often to prevent spread of the virus.  Use your inhaler more if you have asthma.  Do not use any tobacco products, including cigarettes, chewing tobacco, or electronic cigarettes. If you need help quitting, ask your doctor. Contact a doctor if:  You are getting worse, not better.  Your symptoms are not helped by medicine.  You have chills.  You are getting more short of breath.  You have brown or red mucus.  You have yellow or brown discharge from your nose.  You have pain in your face, especially when you bend forward.  You have a fever.  You have puffy (swollen) neck glands.  You have pain while swallowing.  You have white areas in the back of your throat. Get help right away if:  You have very bad or constant:  Headache.  Ear pain.  Pain in your forehead, behind your eyes, and over your cheekbones (sinus pain).  Chest pain.  You have long-lasting (chronic) lung disease and any of the following:  Wheezing.  Long-lasting cough.  Coughing up blood.  A change in your usual mucus.  You have a stiff neck.  You have  changes in your:  Vision.  Hearing.  Thinking.  Mood. This information is not intended to replace advice given to you by your health care provider. Make sure you discuss any questions you have with your health care provider. Document Released: 08/10/2007 Document Revised: 10/25/2015 Document Reviewed: 05/29/2013 Elsevier Interactive Patient Education  2017 [employer].     IF you received an x-ray today, you will receive an invoice from [facility]. Please contact [facility] at [phone] with questions or concerns regarding your invoice.   IF you received labwork today, you will receive an invoice from [facility]. Please contact [facility] at [phone] with questions or concerns regarding your invoice.   Our billing staff will not be able to assist you with questions regarding bills from these companies.  You will be contacted with the lab results as soon as they are available. The fastest way to get your results is to activate your My Chart account. Instructions are located on the last page of this paperwork. If you have not heard from us regarding the results in 2 weeks, please contact this office.      

## 2016-03-21 NOTE — Progress Notes (Signed)
[facility] [address], [place] [id number] [phone]  Date:  03/21/2016   Name:  Melanie Mccoy   DOB:  1990/05/10   MRN:  [id number]  PCP:  Lubertha SouthSteve Luking, MD    History of Present Illness:  Melanie Mccoy is a 26 y.o. female patient [facility]... Chief Complaint  Patient presents with  . Cough    Symptoms began Friday   . Sore Throat  . Chills  . Headache  . Fatigue   Physical, patient started feeling malaise at night. The next day she felt subjective chills and fever. This progressed into headaches, rhinorrhea, and sore throat. She is coughing. This is worsened with laying flat. Shower appears to help her coughing. She has used antipyretic for her symptoms which helped temporarily. She has sore throat and chills at this time, though no fever.  No known sick contacts however has a small child who goes to day care.  Patient reports that she would like her blood sugar checked today. She says that it is ran in the 170s. She noted at the end of our visit that she has not taken her medication for 2 days.  Patient Active Problem List   Diagnosis Date Noted  . Type 2 diabetes mellitus without complication, without long-term current use of insulin (HCC) 01/19/2016  . Acne vulgaris 04/15/2014  . Morbid obesity (HCC) 04/15/2014  . PCOS (polycystic ovarian syndrome) 04/15/2014  . Status post primary low transverse cesarean section 11/22/2012    Past Medical History:  Diagnosis Date  . Anemia    slightly anemic, iron supplements every 2-3 days  . Bartholin cyst   . Chlamydia 01/2011  . Diabetes mellitus without complication (HCC)    gestational diagnosis 2nd trimester  . Family history of anesthesia complication    grandmother had spinal with nerve damage  . No pertinent past medical history     Past Surgical History:  Procedure Laterality Date  . CESAREAN SECTION N/A 11/22/2012   Procedure: PRIMARY CESAREAN SECTION;   Surgeon: Esmeralda ArthurSandra A Rivard, MD;  Location: [facility];  Service: Obstetrics;  Laterality: N/A;  . CYST REMOVAL TRUNK  2005    Social History  Substance Use Topics  . Smoking status: Never Smoker  . Smokeless tobacco: Never Used  . Alcohol use No    Family History  Problem Relation Age of Onset  . Cancer Other   . Heart disease Other   . Diabetes Other   . Diabetes Mother   . Diabetes Father   . Diabetes Maternal Grandfather     Allergies  Allergen Reactions  . Bee Venom Swelling    Progressively worse with each sting  . Ibuprofen Hives  . Nickel Rash    Medication list has been reviewed and updated.  Current Outpatient Prescriptions on File Prior to Visit  Medication Sig Dispense Refill  . metFORMIN (GLUCOPHAGE) 500 MG tablet Take 1 tablet (500 mg total) by mouth 2 (two) times daily with a meal. 180 tablet 0  . azithromycin (ZITHROMAX) 250 MG tablet Take 2 tabs PO x 1 dose, then 1 tab PO QD x 4 days (Patient not taking: Reported on 03/21/2016) 6 tablet 0  . canagliflozin (INVOKANA) 100 MG TABS tablet Take 1 tablet (100 mg total) by mouth daily before breakfast. (Patient not taking: Reported on 03/21/2016) 30 tablet 0  . HYDROcodone-acetaminophen (NORCO/VICODIN) 5-325 MG tablet Take 1 tablet by mouth every  6 hours as needed for pain (Patient not taking: Reported on 03/21/2016) 24 tablet 0  . metroNIDAZOLE (FLAGYL) 500 MG tablet Take 1 tablet (500 mg total) by mouth 2 (two) times daily with a meal. DO NOT CONSUME ALCOHOL WHILE TAKING THIS MEDICATION. (Patient not taking: Reported on 03/21/2016) 14 tablet 0  . MONO-LINYAH 0.25-35 MG-MCG tablet   0  . phentermine (ADIPEX-P) 37.5 MG tablet Take 1 tablet (37.5 mg total) by mouth daily before breakfast. (Patient not taking: Reported on 03/21/2016) 30 tablet 0  . ranitidine (ZANTAC) 150 MG tablet Take 1 tablet (150 mg total) by mouth 2 (two) times daily. (Patient not taking: Reported on 03/21/2016) 60 tablet 0   No current facility-administered  medications on file prior to visit.     ROS ROS otherwise unremarkable unless listed above.  Physical Examination: BP 118/86   Pulse (!) 111   Temp 98.1 F (36.7 C) (Oral)   Resp 16   Ht 5' 5.5" (1.664 m)   Wt 224 lb 12.8 oz (102 kg)   SpO2 94%   BMI 36.84 kg/m  Ideal Body Weight: Weight in (lb) to have BMI = 25: 152.2  Physical Exam  Constitutional: She is oriented to person, place, and time. She appears well-developed and well-nourished. No distress.  HENT:  Head: Normocephalic and atraumatic.  Right Ear: External ear normal.  Left Ear: External ear normal.  Eyes: Conjunctivae and EOM are normal. Pupils are equal, round, and reactive to light.  Cardiovascular: Normal rate.   Pulmonary/Chest: Effort normal. No respiratory distress.  Neurological: She is alert and oriented to person, place, and time.  Skin: She is not diaphoretic.  Psychiatric: She has a normal mood and affect. Her behavior is normal.   Results for orders placed or performed in visit on 03/21/16  POCT glucose (manual entry)  Result Value Ref Range   POC Glucose 202 (A) 70 - 99 mg/dl    Assessment and Plan: Abiha Lukehart is a 26 y.o. female who is here today for sore throat, chills, cough, and sore throat. This appears to be viral and we will treated supportively. Advised of alarming symptoms to warrant immediate return. I've also encouraged and educated patient of taking her metformin daily, particularly when she has an illness to control her glucose. She voiced understanding. She will follow-up in 2 months for diabetes recheck.  Viral URI with cough - Plan: ipratropium (ATROVENT) 0.03 % nasal spray, Guaifenesin (MUCINEX MAXIMUM STRENGTH) 1200 MG TB12  Type 2 diabetes mellitus without complication, without long-term current use of insulin (HCC) - Plan: POCT glucose (manual entry)  Trena Platt, PA-C [facility] 1/17/201810:19 AM

## 2016-06-03 ENCOUNTER — Other Ambulatory Visit: Payer: Self-pay | Admitting: Family Medicine

## 2016-06-03 NOTE — Telephone Encounter (Signed)
No longer seeing Dr. Gerda Diss. Has been seeing Ms. Tiburcio Pea, FNP. Needs to establish with new PCP. Transferred to [place] to schedule.  Meds ordered this encounter  Medications  . metFORMIN (GLUCOPHAGE) 500 MG tablet    Sig: take 1 tablet by mouth twice a day with food    Dispense:  180 tablet    Refill:  0

## 2016-06-04 ENCOUNTER — Ambulatory Visit (INDEPENDENT_AMBULATORY_CARE_PROVIDER_SITE_OTHER): Payer: BLUE CROSS/BLUE SHIELD | Admitting: Urgent Care

## 2016-06-04 VITALS — BP 126/86 | HR 67 | Temp 98.8°F | Resp 16 | Ht 64.5 in | Wt 225.5 lb

## 2016-06-04 DIAGNOSIS — K30 Functional dyspepsia: Secondary | ICD-10-CM

## 2016-06-04 DIAGNOSIS — R110 Nausea: Secondary | ICD-10-CM

## 2016-06-04 DIAGNOSIS — IMO0001 Reserved for inherently not codable concepts without codable children: Secondary | ICD-10-CM

## 2016-06-04 DIAGNOSIS — E1165 Type 2 diabetes mellitus with hyperglycemia: Secondary | ICD-10-CM

## 2016-06-04 LAB — POCT GLYCOSYLATED HEMOGLOBIN (HGB A1C): Hemoglobin A1C: 9.6

## 2016-06-04 MED ORDER — SITAGLIP PHOS-METFORMIN HCL ER 50-1000 MG PO TB24: 1.0000 | ORAL_TABLET | Freq: Two times a day (BID) | ORAL | 1 refills | Status: DC

## 2016-06-04 NOTE — Patient Instructions (Addendum)
Salads - Kale, Spinach, Cabbage, Spring mix Fruits - Avocadoes, berries (blueberries, raspberries, blackberries), apples, oranges, pomegranate Seeds - Quinoa, [place] seeds; you can also incorporate oatmeal Vegetables - aspargus, mashed cauliflower, broccoli, green beans, brussel spouts, bell peppers; stay away from starchy vegetables like potatoes, carrots, peas  Brussel sprouts - Cut off stems. Place in a mixing bowl that has a lid. Pour in a 1/4-1/2 cup olive oil, spices, use a light amount of parmesan. Place on a baking sheet. Bake for 10 minutes at 400F. Take it out, eat the brussel chips. Place for another 5-10 minutes.   Mashed cauliflower - Boil a bunch of cauliflower in a pot of water. Blend in a food processor with 1-2 tablespoons of butter.  Spaghetti squash -  Cut the squash in half very carefully, clean out seeds from the middle. Place 1/2 face down in a microwave safe dish with at least 2 inches of water. Make 4-6 slits on outside of spaghetti squash and microwave for 10-12 minutes. Take out the spaghetti using a metal spoon. Repeat for the other half.   Vega protein is good protein powder, make sure you use ~6 ice cubes to give it smoothie consistency together with ~4-6 ounces of vanilla soy milk. Throw cinnamon into your shake, use peanut butter. You can also use the fruits that I listed above. Throw spinach or kale into the shake.      Diabetes Mellitus and Food It is important for you to manage your blood sugar (glucose) level. Your blood glucose level can be greatly affected by what you eat. Eating healthier foods in the appropriate amounts throughout the day at about the same time each day will help you control your blood glucose level. It can also help slow or prevent worsening of your diabetes mellitus. Healthy eating may even help you improve the level of your blood pressure and reach or maintain a healthy weight. General recommendations for healthful eating and cooking habits  include:  Eating meals and snacks regularly. Avoid going long periods of time without eating to lose weight.  Eating a diet that consists mainly of plant-based foods, such as fruits, vegetables, nuts, legumes, and whole grains.  Using low-heat cooking methods, such as baking, instead of high-heat cooking methods, such as deep frying. Work with your dietitian to make sure you understand how to use the Nutrition Facts information on food labels. How can food affect me? Carbohydrates  Carbohydrates affect your blood glucose level more than any other type of food. Your dietitian will help you determine how many carbohydrates to eat at each meal and teach you how to count carbohydrates. Counting carbohydrates is important to keep your blood glucose at a healthy level, especially if you are using insulin or taking certain medicines for diabetes mellitus. Alcohol  Alcohol can cause sudden decreases in blood glucose (hypoglycemia), especially if you use insulin or take certain medicines for diabetes mellitus. Hypoglycemia can be a life-threatening condition. Symptoms of hypoglycemia (sleepiness, dizziness, and disorientation) are similar to symptoms of having too much alcohol. If your health care provider has given you approval to drink alcohol, do so in moderation and use the following guidelines:  Women should not have more than one drink per day, and men should not have more than two drinks per day. One drink is equal to:  12 oz of beer.  5 oz of wine.  1 oz of hard liquor.  Do not drink on an empty stomach.  Keep yourself hydrated. Have  water, diet soda, or unsweetened iced tea.  Regular soda, juice, and other mixers might contain a lot of carbohydrates and should be counted. What foods are not recommended? As you make food choices, it is important to remember that all foods are not the same. Some foods have fewer nutrients per serving than other foods, even though they might have the same  number of calories or carbohydrates. It is difficult to get your body what it needs when you eat foods with fewer nutrients. Examples of foods that you should avoid that are high in calories and carbohydrates but low in nutrients include:  Trans fats (most processed foods list trans fats on the Nutrition Facts label).  Regular soda.  Juice.  Candy.  Sweets, such as cake, pie, doughnuts, and cookies.  Fried foods. What foods can I eat? Eat nutrient-rich foods, which will nourish your body and keep you healthy. The food you should eat also will depend on several factors, including:  The calories you need.  The medicines you take.  Your weight.  Your blood glucose level.  Your blood pressure level.  Your cholesterol level. You should eat a variety of foods, including:  Protein.  Lean cuts of meat.  Proteins low in saturated fats, such as fish, egg whites, and beans. Avoid processed meats.  Fruits and vegetables.  Fruits and vegetables that may help control blood glucose levels, such as apples, mangoes, and yams.  Dairy products.  Choose fat-free or low-fat dairy products, such as milk, yogurt, and cheese.  Grains, bread, pasta, and rice.  Choose whole grain products, such as multigrain bread, whole oats, and brown rice. These foods may help control blood pressure.  Fats.  Foods containing healthful fats, such as nuts, avocado, olive oil, canola oil, and fish. Does everyone with diabetes mellitus have the same meal plan? Because every person with diabetes mellitus is different, there is not one meal plan that works for everyone. It is very important that you meet with a dietitian who will help you create a meal plan that is just right for you. This information is not intended to replace advice given to you by your health care provider. Make sure you discuss any questions you have with your health care provider. Document Released: 11/18/2004 Document Revised:  07/30/2015 Document Reviewed: 01/18/2013 Elsevier Interactive Patient Education  2017 [place].    Metformin; Sitagliptin extended-release tablets What is this medicine? METFORMIN; SITAGLIPTIN (met FOR min; sit a GLIP tin) is a combination of 2 medicines used to treat type 2 diabetes. This medicine lowers blood sugar. Treatment is combined with a balanced diet and exercise. This medicine may be used for other purposes; ask your health care provider or pharmacist if you have questions. COMMON BRAND NAME(S): Janumet XR What should I tell my health care provider before I take this medicine? They need to know if you have any of these conditions: -become easily dehydrated -diabetic ketoacidosis -heart disease, past heart attack -history of pancreatitis -if you often drink alcohol -kidney disease -liver disease -polycystic ovary syndrome -previous swelling of the tongue, face, or lips with difficulty breathing, difficulty swallowing, hoarseness, or tightening of the throat -serious infection or injury -thyroid disease -undergoing surgery or certain x-ray procedures with injectable contrast agents -an unusual or allergic reaction to metformin, sitagliptin, other medicines, foods, dyes, or preservatives -pregnant or trying to get pregnant -breast-feeding How should I use this medicine? Take this medicine by mouth with a glass of water. Follow the directions on  the prescription label. Take this medicine with food once daily in the evening. Do not cut, crush or chew this medicine. Take your doses at regular intervals. Do not take your medicine more often than directed. Do not stop taking except on your doctor's advice. A special MedGuide will be given to you by the pharmacist with each prescription and refill. Be sure to read this information carefully each time. Talk to your pediatrician regarding the use of this medicine in children. Special care may be needed. Overdosage: If you think  you have taken too much of this medicine contact a poison control center or emergency room at once. NOTE: This medicine is only for you. Do not share this medicine with others. What if I miss a dose? If you miss a dose, take it as soon as you can. If it is almost time for your next dose, take only that dose. Do not take double or extra doses. What may interact with this medicine? Do not take this medicine with any of the following medications: -dofetilide -gatifloxacin -certain contrast medicines given before X-rays, CT scans, MRI, or other procedures This medicine may also interact with the following medications: -acetazolamide -amiloride -certain antiviral medicines for HIV infection or hepatitis -cimetidine -crizotinib -digoxin -diuretics -female hormones, like estrogens or progestins and birth control pills -glycopyrrolate -isoniazid -lamotrigine -medicines for blood pressure, heart disease, irregular heart beat -memantine -methazolamide -midodrine -morphine -nicotinic acid -phenothiazines like chlorpromazine, mesoridazine, prochlorperazine, thioridazine -phenytoin -procainamide -propantheline -quinidine -quinine -ranitidine -ranolazine -steroid medicines like prednisone or cortisone -stimulant medicines for attention disorders, weight loss, or to stay awake -thyroid medicines -topiramate -trimethoprim -trospium -vancomycin -vandetanib -zonisamide This list may not describe all possible interactions. Give your health care provider a list of all the medicines, herbs, non-prescription drugs, or dietary supplements you use. Also tell them if you smoke, drink alcohol, or use illegal drugs. Some items may interact with your medicine. What should I watch for while using this medicine? Visit your doctor or health care professional for regular checks on your progress. A test called the HbA1C (A1C) will be monitored. This is a simple blood test. It measures your blood sugar  control over the last 2 to 3 months. You will receive this test every 3 to 6 months. Learn how to check your blood sugar. Learn the symptoms of high or low blood sugar and how to manage them. Always carry a quick-source of sugar with you in case you have symptoms of low blood sugar. Examples include hard sugar candy or glucose tablets. Make sure others know that you can choke if you eat or drink when you develop serious symptoms of low blood sugar, such as seizures or unconsciousness. They must get medical help at once. Tell your doctor or health care professional if you have high blood sugar. You might need to change the dose of your medicine. If you are sick or exercising more than usual, you might need to change the dose of your medicine. Do not skip meals. Ask your doctor or health care professional if you should avoid alcohol. Many nonprescription cough and cold products contain sugar or alcohol. These can affect blood sugar. This medicine may cause ovulation in premenopausal women who do not have regular monthly periods. This may increase your chances of becoming pregnant. You should not take this medicine if you become pregnant or think you may be pregnant. Talk with your doctor or health care professional about your birth control options while taking this medicine. Contact your  doctor or health care professional right away if think you are pregnant. If you are going to need surgery, a MRI, CT scan, or other procedure, tell your doctor that you are taking this medicine. You may need to stop taking this medicine before the procedure. Wear a medical ID bracelet or chain, and carry a card that describes your disease and details of your medicine and dosage times. What side effects may I notice from receiving this medicine? Side effects that you should report to your doctor or health care professional as soon as possible: -allergic reactions like skin rash, itching or hives, swelling of the face, lips,  or tongue -breathing problems -feeling faint or lightheaded, falls -fever, chills -joint pain -loss of appetite -muscle pains -signs and symptoms of low blood sugar such as feeling anxious, confusion, dizziness, increased hunger, unusually weak or tired, sweating, shakiness, cold, irritable, headache, blurred vision, fast heartbeat, loss of consciousness -slow or irregular heartbeat -swelling of the ankles, feet, hands -unusual stomach upset or pain -vomiting Side effects that usually do not require medical attention (report to your doctor or health care professional if they continue or are bothersome): -changes in taste -diarrhea -headache -stomach gas, upset -stuffy or runny nose This list may not describe all possible side effects. Call your doctor for medical advice about side effects. You may report side effects to FDA at 1-800-FDA-1088. Where should I keep my medicine? Keep out of the reach of children. Store at room temperature between 15 and 30 degrees C (59 and 86 degrees F). Throw away any unused medicine after the expiration date. NOTE: This sheet is a summary. It may not cover all possible information. If you have questions about this medicine, talk to your doctor, pharmacist, or health care provider.  2018 Elsevier/Gold Standard (2015-03-26 12:09:51)   IF you received an x-ray today, you will receive an invoice from [facility]. Please contact [facility] at [phone] with questions or concerns regarding your invoice.   IF you received labwork today, you will receive an invoice from [place]. Please contact [facility] at [phone] with questions or concerns regarding your invoice.   Our billing staff will not be able to assist you with questions regarding bills from these companies.  You will be contacted with the lab results as soon as they are available. The fastest way to get your results is to activate your My Chart account. Instructions are  located on the last page of this paperwork. If you have not heard from [place] regarding the results in 2 weeks, please contact this office.

## 2016-06-04 NOTE — Progress Notes (Signed)
MRN: [id number]  Subjective:   Melanie Mccoy is a 26 y.o. female who presents for follow up of Type 2 Diabetes Mellitus.   Diagnosis was made 01/2016. Patient is currently managed with Metformin 1,000mg  BID. Reports occasional upset stomach with Metformin. Patient is checking home blood sugars. Home blood sugar is generally 150's and 180's fasting, 2 hours post-prandial, respectively. Patient denies blurred vision, polydipsia, chest pain, nausea, vomiting, abdominal pain, hematuria, polyuria, skin infections, numbness or tingling. Patient is checking their feet daily. Denies foot concerns. Has never had a formal diabetic eye exam eye exam. Diet is non-compliant. Patient is not exercising. Denies smoking cigarettes or drinking alcohol. 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  Known diabetic complications: none  Immunizations: Pneumococal vaccine refused. Patient does not want to get shots.  Objective:   PHYSICAL EXAM BP 126/86   Pulse 67   Temp 98.8 F (37.1 C) (Oral)   Resp 16   Ht 5' 4.5" (1.638 m)   Wt 225 lb 8 oz (102.3 kg)   LMP 05/10/2016   SpO2 98%   BMI 38.11 kg/m   Physical Exam  Constitutional: She is oriented to person, place, and time. She appears well-developed and well-nourished.  HENT:  Mouth/Throat: Oropharynx is clear and moist.  Eyes: No scleral icterus.  Cardiovascular: Normal rate, regular rhythm and intact distal pulses.  Exam reveals no gallop and no friction rub.   No murmur heard. Pulmonary/Chest: No respiratory distress. She has no wheezes. She has no rales.  Abdominal: Soft. Bowel sounds are normal. She exhibits no distension and no mass. There is no tenderness. There is no guarding.  Neurological: She is alert and oriented to person, place, and time.  Skin: Skin is warm and dry. Capillary refill takes less than 2 seconds.  Psychiatric: She has a normal mood and affect.   Diabetic Foot Form -  Detailed   Diabetic Foot Exam - detailed Diabetic Foot exam was performed with the following findings:  Yes 06/04/2016 10:54 AM  Visual Foot Exam completed.:  Yes  Is there a history of foot ulcer?:  No Can the patient see the bottom of their feet?:  Yes Are the shoes appropriate in style and fit?:  Yes Is there swelling or and abnormal foot shape?:  No Are the toenails long?:  No Are the toenails thick?:  No Do you have pain in calf while walking?:  No Is there a claw toe deformity?:  No Is there elevated skin temparature?:  No Is there limited skin dorsiflexion?:  No Is there foot or ankle muscle weakness?:  No Are the toenails ingrown?:  No Normal Range of Motion:  Yes Pulse Foot Exam completed.:  Yes  Right posterior Tibialias:  Present Left posterior Tibialias:  Present  Right Dorsalis Pedis:  Present Left Dorsalis Pedis:  Present  Sensory Foot Exam Completed.:  Yes Swelling:  No Semmes-Weinstein Monofilament Test       Results for orders placed or performed in visit on 06/04/16 (from the past 24 hour(s))  POCT glycosylated hemoglobin (Hb A1C)     Status: None   Collection Time: 06/04/16 11:14 AM  Result Value Ref Range   Hemoglobin A1C 9.6    Assessment and Plan :   1. Uncontrolled type 2 diabetes mellitus without complication, without long-term current use of insulin (HCC) 2. Morbid obesity (HCC) - Counseled on need for dramatic lifestyle changes. Start metformin-sitagliptin. Patient  will let me know about coverage for this medication with her insurance policy. Will refer to diabetic nutrition if coverage allows for this as well. Counseled on need for immunizations but patient refuses. Recheck in 3 months.  3. Upset stomach 4. Nausea without vomiting - Will try switching over to extended release of Metformin as above. Monitor.  Wallis Bamberg, PA-C [facility] [phone] 06/04/2016 10:45 AM

## 2016-06-05 LAB — MICROALBUMIN / CREATININE URINE RATIO
CREATININE, UR: 162.5 mg/dL
MICROALB/CREAT RATIO: 8.9 mg/g{creat} (ref 0.0–30.0)
Microalbumin, Urine: 14.4 ug/mL

## 2016-06-05 LAB — BASIC METABOLIC PANEL
BUN / CREAT RATIO: 18 (ref 9–23)
BUN: 16 mg/dL (ref 6–20)
CALCIUM: 9.3 mg/dL (ref 8.7–10.2)
CHLORIDE: 100 mmol/L (ref 96–106)
CO2: 22 mmol/L (ref 18–29)
CREATININE: 0.87 mg/dL (ref 0.57–1.00)
GFR calc non Af Amer: 94 mL/min/{1.73_m2} (ref >59)
GFR, EST AFRICAN AMERICAN: 108 mL/min/{1.73_m2} (ref >59)
GLUCOSE: 169 mg/dL — AB (ref 65–99)
Potassium: 4.5 mmol/L (ref 3.5–5.2)
Sodium: 141 mmol/L (ref 134–144)

## 2016-06-06 ENCOUNTER — Encounter: Payer: Self-pay | Admitting: Urgent Care

## 2016-12-12 ENCOUNTER — Encounter: Payer: Self-pay | Admitting: Family Medicine

## 2016-12-12 ENCOUNTER — Ambulatory Visit (INDEPENDENT_AMBULATORY_CARE_PROVIDER_SITE_OTHER): Payer: 59 | Admitting: Family Medicine

## 2016-12-12 VITALS — BP 124/86 | Temp 98.9°F | Ht 64.5 in | Wt 225.0 lb

## 2016-12-12 DIAGNOSIS — J069 Acute upper respiratory infection, unspecified: Secondary | ICD-10-CM

## 2016-12-12 DIAGNOSIS — B9789 Other viral agents as the cause of diseases classified elsewhere: Secondary | ICD-10-CM | POA: Diagnosis not present

## 2016-12-12 MED ORDER — HYDROCODONE-HOMATROPINE 5-1.5 MG/5ML PO SYRP: ORAL_SOLUTION | ORAL | 0 refills | Status: DC

## 2016-12-12 NOTE — Progress Notes (Signed)
   Subjective:    Patient ID: Melanie Mccoy, female    DOB: [birth year], 26 y.o.   MRN: [id number]  Sinusitis  This is a new problem. Episode onset: 3 days. Associated symptoms include congestion, coughing, headaches and a sore throat. Pertinent negatives include no ear pain or shortness of breath. Past treatments include nothing.   Significant sinus symptoms head congestion drainage coughing no wheezing difficulty breathing no vomiting or diarrhea no high fever chills symptoms over the past several days   Review of Systems  Constitutional: Negative for activity change and fever.  HENT: Positive for congestion, rhinorrhea and sore throat. Negative for ear pain.   Eyes: Negative for discharge.  Respiratory: Positive for cough. Negative for shortness of breath and wheezing.   Cardiovascular: Negative for chest pain.  Neurological: Positive for headaches.       Objective:   Physical Exam  Constitutional: She appears well-developed.  HENT:  Head: Normocephalic.  Right Ear: External ear normal.  Left Ear: External ear normal.  Nose: Nose normal.  Mouth/Throat: Oropharynx is clear and moist. No oropharyngeal exudate.  Eyes: Right eye exhibits no discharge. Left eye exhibits no discharge.  Neck: Neck supple. No tracheal deviation present.  Cardiovascular: Normal rate and normal heart sounds.   No murmur heard. Pulmonary/Chest: Effort normal and breath sounds normal. She has no wheezes. She has no rales.  Lymphadenopathy:    She has no cervical adenopathy.  Skin: Skin is warm and dry.  Nursing note and vitals reviewed.    Does not appear toxic     Assessment & Plan:  Viral syndrome No sign of bacterial component If it persists call for an antibiotic Hycodan cough medicine for nighttime use caution drowsiness not for long-term use Follow-up if problems

## 2017-01-31 DIAGNOSIS — N750 Cyst of Bartholin's gland: Secondary | ICD-10-CM | POA: Insufficient documentation

## 2017-01-31 DIAGNOSIS — Z01419 Encounter for gynecological examination (general) (routine) without abnormal findings: Secondary | ICD-10-CM | POA: Diagnosis not present

## 2017-01-31 DIAGNOSIS — K279 Peptic ulcer, site unspecified, unspecified as acute or chronic, without hemorrhage or perforation: Secondary | ICD-10-CM | POA: Insufficient documentation

## 2017-02-01 DIAGNOSIS — E119 Type 2 diabetes mellitus without complications: Secondary | ICD-10-CM | POA: Insufficient documentation

## 2017-02-03 ENCOUNTER — Ambulatory Visit (INDEPENDENT_AMBULATORY_CARE_PROVIDER_SITE_OTHER): Payer: 59 | Admitting: Nurse Practitioner

## 2017-02-03 ENCOUNTER — Encounter: Payer: Self-pay | Admitting: Nurse Practitioner

## 2017-02-03 VITALS — BP 128/86 | Ht 64.5 in | Wt 227.8 lb

## 2017-02-03 DIAGNOSIS — E119 Type 2 diabetes mellitus without complications: Secondary | ICD-10-CM

## 2017-02-03 DIAGNOSIS — E785 Hyperlipidemia, unspecified: Secondary | ICD-10-CM

## 2017-02-03 DIAGNOSIS — E1169 Type 2 diabetes mellitus with other specified complication: Secondary | ICD-10-CM | POA: Diagnosis not present

## 2017-02-03 MED ORDER — DULAGLUTIDE 0.75 MG/0.5ML ~~LOC~~ SOAJ: SUBCUTANEOUS | 2 refills | Status: DC

## 2017-02-03 MED ORDER — METFORMIN HCL 500 MG PO TABS: 500.0000 mg | ORAL_TABLET | Freq: Two times a day (BID) | ORAL | 2 refills | Status: DC

## 2017-02-03 NOTE — Patient Instructions (Signed)
Genital Herpes Genital herpes is a common sexually transmitted infection (STI) that is caused by a virus. The virus spreads from person to person through sexual contact. Infection can cause itching, blisters, and sores around the genitals or rectum. Symptoms may last several days and then go away This is called an outbreak. However, the virus remains in your body, so you may have more outbreaks in the future. The time between outbreaks varies and can be months or years. Genital herpes affects men and women. It is particularly concerning for pregnant women because the virus can be passed to the baby during delivery and can cause serious problems. Genital herpes is also a concern for people who have a weak disease-fighting (immune) system. What are the causes? This condition is caused by the herpes simplex virus (HSV) type 1 or type 2. The virus may spread through:  Sexual contact with an infected person, including vaginal, anal, and oral sex.  Contact with fluid from a herpes sore.  The skin. This means that you can get herpes from an infected partner even if he or she does not have a visible sore or does not know that he or she is infected. What increases the risk? You are more likely to develop this condition if:  You have sex with many partners.  You do not use latex condoms during sex. What are the signs or symptoms? Most people do not have symptoms (asymptomatic) or have mild symptoms that may be mistaken for other skin problems. Symptoms may include:  Small red bumps near the genitals, rectum, or mouth. These bumps turn into blisters and then turn into sores.  Flu-like symptoms, including:  Fever.  Body aches.  Swollen lymph nodes.  Headache.  Painful urination.  Pain and itching in the genital area or rectal area.  Vaginal discharge.  Tingling or shooting pain in the legs and buttocks. Generally, symptoms are more severe and last longer during the first (primary)  outbreak. Flu-like symptoms are also more common during the primary outbreak. How is this diagnosed? Genital herpes may be diagnosed based on:  A physical exam.  Your medical history.  Blood tests.  A test of a fluid sample (culture) from an open sore. How is this treated? There is no cure for this condition, but treatment with antiviral medicines that are taken by mouth (orally) can do the following:  Speed up healing and relieve symptoms.  Help to reduce the spread of the virus to sexual partners.  Limit the chance of future outbreaks, or make future outbreaks shorter.  Lessen symptoms of future outbreaks. Your health care provider may also recommend pain relief medicines, such as aspirin or ibuprofen. Follow these instructions at home: Sexual activity   Do not have sexual contact during active outbreaks.  Practice safe sex. Latex condoms and female condoms may help prevent the spread of the herpes virus. General instructions   Keep the affected areas dry and clean.  Take over-the-counter and prescription medicines only as told by your health care provider.  Avoid rubbing or touching blisters and sores. If you do touch blisters or sores:  Wash your hands thoroughly with soap and water.  Do not touch your eyes afterward.  To help relieve pain or itching, you may take the following actions as directed by your health care provider:  Apply a cold, wet cloth (cold compress) to affected areas 4-6 times a day.  Apply a substance that protects your skin and reduces bleeding (astringent).  Apply a   gel that helps relieve pain around sores (lidocaine gel).  Take a warm, shallow bath that cleans the genital area (sitz bath).  Keep all follow-up visits as told by your health care provider. This is important. How is this prevented?  Use condoms. Although anyone can get genital herpes during sexual contact, even with the use of a condom, a condom can provide some  protection.  Avoid having multiple sexual partners.  Talk with your sexual partner about any symptoms either of you may have. Also, talk with your partner about any history of STIs.  Get tested for STIs before you have sex. Ask your partner to do the same.  Do not have sexual contact if you have symptoms of genital herpes. Contact a health care provider if:  Your symptoms are not improving with medicine.  Your symptoms return.  You have new symptoms.  You have a fever.  You have abdominal pain.  You have redness, swelling, or pain in your eye.  You notice new sores on other parts of your body.  You are a woman and experience bleeding between menstrual periods.  You have had herpes and you become pregnant or plan to become pregnant. Summary  Genital herpes is a common sexually transmitted infection (STI) that is caused by the herpes simplex virus (HSV) type 1 or type 2.  These viruses are most often spread through sexual contact with an infected person.  You are more likely to develop this condition if you have sex with many partners or you have unprotected sex.  Most people do not have symptoms (asymptomatic) or have mild symptoms that may be mistaken for other skin problems. Symptoms occur as outbreaks that may happen months or years apart.  There is no cure for this condition, but treatment with oral antiviral medicines can reduce symptoms, reduce the chance of spreading the virus to a partner, prevent future outbreaks, or shorten future outbreaks. This information is not intended to replace advice given to you by your health care provider. Make sure you discuss any questions you have with your health care provider. Document Released: 02/19/2000 Document Revised: 01/22/2016 Document Reviewed: 01/22/2016 Elsevier Interactive Patient Education  2017 [employer]  

## 2017-02-04 ENCOUNTER — Encounter: Payer: Self-pay | Admitting: Nurse Practitioner

## 2017-02-04 DIAGNOSIS — E785 Hyperlipidemia, unspecified: Secondary | ICD-10-CM

## 2017-02-04 DIAGNOSIS — E1169 Type 2 diabetes mellitus with other specified complication: Secondary | ICD-10-CM | POA: Insufficient documentation

## 2017-02-04 NOTE — Progress Notes (Signed)
Subjective:  Presents to discuss recent labs done through GYN. Has not been taking any medicine for diabetes. Does not check her sugar. Does not do well with diet. No regular exercise. Her last diabetes check up was done through urgent care on 06/04/16; see notes.  Did have some stomach upset with Metformin at first but this gradually improved over a couple of weeks. States she was not given Janumet as prescribed but plain Metformin at that time. Has experienced some fatigue and headache. No polyuria, polydipsia or polyphagia. Had a foot exam in March. Has had an eye exam. No CP/ischemic type pain or unusual SOB.   Objective:   BP 128/86   Ht 5' 4.5" (1.638 m)   Wt 227 lb 12.8 oz (103.3 kg)   BMI 38.50 kg/m  NAD. Alert, oriented. Lungs clear. Heart RRR. Labs dated 11/281/18: A1C 11.4; GFR normal; Lipids: TC 230, HDL 33; TG 308; LDL 150. See scanned labs. Microalbumin/creatinine ratio normal on 06/04/16.   Assessment:   Problem List Items Addressed This Visit      Endocrine   Hyperlipidemia associated with type 2 diabetes mellitus (HCC)   Relevant Medications   Dulaglutide (TRULICITY) 0.75 MG/0.5ML SOPN   metFORMIN (GLUCOPHAGE) 500 MG tablet   Type 2 diabetes mellitus without complication, without long-term current use of insulin (HCC) - Primary   Relevant Medications   Dulaglutide (TRULICITY) 0.75 MG/0.5ML SOPN   metFORMIN (GLUCOPHAGE) 500 MG tablet        Plan:   Meds ordered this encounter  Medications  . Dulaglutide (TRULICITY) 0.75 MG/0.5ML SOPN    Sig: Inject subcu once a week for diabetes    Dispense:  3 pen    Refill:  2    Please dispense 30 day supply    Order Specific Question:   Supervising Provider    Answer:   Merlyn AlbertLUKING, WILLIAM S [[id number]]  . metFORMIN (GLUCOPHAGE) 500 MG tablet    Sig: Take 1 tablet (500 mg total) by mouth 2 (two) times daily with a meal.    Dispense:  60 tablet    Refill:  2    Order Specific Question:   Supervising Provider    Answer:   Riccardo DubinLUKING,  WILLIAM S [[id number]]   Lengthy discussion regarding options for diabetes. Very resistant to insulin. agrees to restart Metformin; start with supper and add second dose if tolerated. Trial of Trulicity weekly. Reviewed potential adverse effects. DC either med and call if any problems. Given Rx for supplies of her machine; to check glucose daily and bring to next visit. Lengthy discussion regarding risks associated with uncontrolled diabetes. Discussed risk of DKA due to high BS. Reviewed dietary changes and programs available to her help her including weight watchers. The priority is to get sugars under control. At our next visit will address other issues such as cholesterol.  25 minutes was spent with the patient. Greater than half the time was spent in discussion and answering questions and counseling regarding the issues that the patient came in for today.

## 2017-05-05 ENCOUNTER — Encounter: Payer: Self-pay | Admitting: Nurse Practitioner

## 2017-05-05 ENCOUNTER — Telehealth: Payer: Self-pay | Admitting: Nurse Practitioner

## 2017-05-05 ENCOUNTER — Ambulatory Visit (INDEPENDENT_AMBULATORY_CARE_PROVIDER_SITE_OTHER): Payer: 59 | Admitting: Nurse Practitioner

## 2017-05-05 VITALS — BP 128/84 | Ht 64.5 in | Wt 214.0 lb

## 2017-05-05 DIAGNOSIS — E119 Type 2 diabetes mellitus without complications: Secondary | ICD-10-CM | POA: Diagnosis not present

## 2017-05-05 DIAGNOSIS — E1169 Type 2 diabetes mellitus with other specified complication: Secondary | ICD-10-CM

## 2017-05-05 DIAGNOSIS — E785 Hyperlipidemia, unspecified: Secondary | ICD-10-CM | POA: Diagnosis not present

## 2017-05-05 LAB — POCT GLYCOSYLATED HEMOGLOBIN (HGB A1C): HEMOGLOBIN A1C: 6.1

## 2017-05-05 MED ORDER — DULAGLUTIDE 1.5 MG/0.5ML ~~LOC~~ SOAJ: SUBCUTANEOUS | 5 refills | Status: DC

## 2017-05-05 NOTE — Telephone Encounter (Signed)
Just saw Melanie Mccoy Melanie Mccoy and forgot to get refill on diabetic strips.  Uses Accu check smart view NANO test strips [facility] [address]

## 2017-05-05 NOTE — Telephone Encounter (Signed)
Script written. Await carolyn signature

## 2017-05-05 NOTE — Progress Notes (Signed)
Subjective:  Presents for recheck on her diabetes. Fasting sugars are much improved at home. All are less than 200. Averaging 130-140 for the past week. Taking Metformin once a day. Taking Trulicity without difficulty. Appetite improved although having some breakthrough hunger at times. Has cut out regular soda and improved her diet. Has also seen improvement in her PCOS symptoms.   Objective:   BP 128/84   Ht 5' 4.5" (1.638 m)   Wt 214 lb (97.1 kg)   BMI 36.17 kg/m  NAD. Alert, oriented. Lungs clear. Heart RRR.  Results for orders placed or performed in visit on 05/05/17  POCT HgB A1C  Result Value Ref Range   Hemoglobin A1C 6.1   last A1C on 01/31/17 was 11.4. Has lost 13 lbs since November.  Diabetic Foot Exam - Simple   Simple Foot Form Diabetic Foot exam was performed with the following findings:  Yes 05/05/2017  4:00 PM  Visual Inspection No deformities, no ulcerations, no other skin breakdown bilaterally:  Yes Sensation Testing Intact to touch and monofilament testing bilaterally:  Yes Pulse Check See comments:  Yes Comments DP pulses present bilat; denies any numbness or pain in the feet.       Assessment:   Problem List Items Addressed This Visit      Endocrine   Hyperlipidemia associated with type 2 diabetes mellitus (HCC)   Relevant Medications   Dulaglutide (TRULICITY) 1.5 MG/0.5ML SOPN   Other Relevant Orders   Lipid Profile   Type 2 diabetes mellitus without complication, without long-term current use of insulin (HCC) - Primary   Relevant Medications   Dulaglutide (TRULICITY) 1.5 MG/0.5ML SOPN   Other Relevant Orders   POCT HgB A1C (Completed)   Urine Microalbumin w/creat. ratio   Lipid Profile       Plan:   Meds ordered this encounter  Medications  . Dulaglutide (TRULICITY) 1.5 MG/0.5ML SOPN    Sig: Inject 1.5 mg subcu once a week    Dispense:  4 pen    Refill:  5    Order Specific Question:   Supervising Provider    Answer:   Merlyn AlbertLUKING, WILLIAM S  [[id number]]   Increase Trulicity dose. Call back if any problems. Encouraged continued healthy diet, activity and weight loss efforts. Gets regular GYN exams.  Return in about 4 months (around 09/04/2017) for recheck.

## 2017-05-05 NOTE — Telephone Encounter (Signed)
Script faxed. Pt notified.

## 2017-05-06 ENCOUNTER — Encounter: Payer: Self-pay | Admitting: Nurse Practitioner

## 2017-05-06 DIAGNOSIS — E119 Type 2 diabetes mellitus without complications: Secondary | ICD-10-CM | POA: Diagnosis not present

## 2017-05-06 DIAGNOSIS — E1169 Type 2 diabetes mellitus with other specified complication: Secondary | ICD-10-CM | POA: Diagnosis not present

## 2017-05-06 DIAGNOSIS — E785 Hyperlipidemia, unspecified: Secondary | ICD-10-CM | POA: Diagnosis not present

## 2017-05-07 LAB — LIPID PANEL
CHOL/HDL RATIO: 5.7 ratio — AB (ref 0.0–4.4)
Cholesterol, Total: 187 mg/dL (ref 100–199)
HDL: 33 mg/dL — AB (ref >39)
LDL Calculated: 97 mg/dL (ref 0–99)
Triglycerides: 284 mg/dL — ABNORMAL HIGH (ref 0–149)
VLDL Cholesterol Cal: 57 mg/dL — ABNORMAL HIGH (ref 5–40)

## 2017-05-07 LAB — MICROALBUMIN / CREATININE URINE RATIO
Creatinine, Urine: 275.7 mg/dL
MICROALBUM., U, RANDOM: 33.6 ug/mL
Microalb/Creat Ratio: 12.2 mg/g creat (ref 0.0–30.0)

## 2017-05-17 ENCOUNTER — Other Ambulatory Visit: Payer: Self-pay | Admitting: Nurse Practitioner

## 2017-05-17 DIAGNOSIS — E785 Hyperlipidemia, unspecified: Principal | ICD-10-CM

## 2017-05-17 DIAGNOSIS — E1169 Type 2 diabetes mellitus with other specified complication: Secondary | ICD-10-CM

## 2017-06-06 ENCOUNTER — Other Ambulatory Visit: Payer: Self-pay | Admitting: Nurse Practitioner

## 2017-06-06 MED ORDER — DULAGLUTIDE 1.5 MG/0.5ML ~~LOC~~ SOAJ: SUBCUTANEOUS | 5 refills | Status: DC

## 2017-08-25 ENCOUNTER — Encounter: Payer: Self-pay | Admitting: Family Medicine

## 2017-08-25 ENCOUNTER — Ambulatory Visit (INDEPENDENT_AMBULATORY_CARE_PROVIDER_SITE_OTHER): Payer: 59 | Admitting: Family Medicine

## 2017-08-25 ENCOUNTER — Ambulatory Visit (HOSPITAL_COMMUNITY)
Admission: RE | Admit: 2017-08-25 | Discharge: 2017-08-25 | Disposition: A | Discharge status: Home / Self Care | Payer: 59 | Source: Ambulatory Visit | Attending: Family Medicine | Admitting: Family Medicine

## 2017-08-25 VITALS — BP 120/76 | Ht 64.5 in | Wt 207.0 lb

## 2017-08-25 DIAGNOSIS — E119 Type 2 diabetes mellitus without complications: Secondary | ICD-10-CM | POA: Diagnosis not present

## 2017-08-25 DIAGNOSIS — M25571 Pain in right ankle and joints of right foot: Secondary | ICD-10-CM

## 2017-08-25 DIAGNOSIS — E282 Polycystic ovarian syndrome: Secondary | ICD-10-CM

## 2017-08-25 DIAGNOSIS — G8929 Other chronic pain: Secondary | ICD-10-CM | POA: Insufficient documentation

## 2017-08-25 LAB — POCT GLYCOSYLATED HEMOGLOBIN (HGB A1C): HEMOGLOBIN A1C: 7.9 % — AB (ref 4.0–5.6)

## 2017-08-25 MED ORDER — DULAGLUTIDE 1.5 MG/0.5ML ~~LOC~~ SOAJ: SUBCUTANEOUS | 5 refills | Status: DC

## 2017-08-25 MED ORDER — METFORMIN HCL 500 MG PO TABS: 500.0000 mg | ORAL_TABLET | Freq: Two times a day (BID) | ORAL | 5 refills | Status: DC

## 2017-08-25 NOTE — Progress Notes (Signed)
   Subjective:    Patient ID: Melanie Mccoy, female    DOB: March 01, 1991, 27 y.o.   MRN: [id number]  HPI Patient arrives with right ankle pain for months. With sig execise wextreme hi activity  Ankle hurts fotr the next two or three day  Last few days, heals up, the goes away,   Has had ho of   injury   Ankle sprain tndencies both ankes fdown thru the yrs, hox of rolled ankles off and on doewn thru the yr,  Did dance    toe dancing hx     Glu' overall steady last o or so  Came dow tremendously, now not so good with exrcise or diet    Metformin hard to remember, takes trulicity once per wk, one ref of that   metform morn tab often does not work so so well   Works at [employer], mostly sitting   Not had to   Has had definitely incr exercise    Results for orders placed or performed in visit on 08/25/17  POCT glycosylated hemoglobin (Hb A1C)  Result Value Ref Range   Hemoglobin A1C 7.9 (A) 4.0 - 5.6 %   HbA1c POC (<> result, manual entry)  4.0 - 5.6 %   HbA1c, POC (prediabetic range)  5.7 - 6.4 %   HbA1c, POC (controlled diabetic range)  0.0 - 7.0 %   Patient claims compliance with diabetes medication. No obvious side effects. Reports no substantial low sugar spells. Most numbers are generally in good range when checked fasting. Generally does not miss a dose of medication. Watching diabetic diet closely  Patient has known PCOS.  States that she was not aware of metformin also helps this. Review of Systems No headache, no major weight loss or weight gain, no chest pain no back pain abdominal pain no change in bowel habits complete ROS otherwise negative     Objective:   Physical Exam  Alert and oriented, vitals reviewed and stable, NAD ENT-TM's and ext canals WNL bilat via otoscopic exam Soft palate, tonsils and post pharynx WNL via oropharyngeal exam Neck-symmetric, no masses; thyroid nonpalpable and nontender Pulmonary-no tachypnea or accessory muscle  use; Clear without wheezes via auscultation Card--no abnrml murmurs, rhythm reg and rate WNL Carotid pulses symmetric, without bruits  Right ankle.  Positive pain with extension some medial malleoli or tenderness.     Assessment & Plan:  1 impression intermittent ankle pain with overexertion.  Long-standing history of ankle sprains and "rolls" pain will flareup for a few days and improved between spells.  2.  Type 2 diabetes.  Suboptimal control.  Discussed.  Some noncompliance with not always taking 1 of her Metformin's each day  3.  PCOS.  Discussion regarding benefit from metformin for this    Greater than 50% of this 25 minute face to face visit was spent in counseling and discussion and coordination of care regarding the above diagnosis/diagnosies 6 months medication written./Importance of compliance discussed.  X-ray ordered.  Of note x-ray normal.  Patient advised this could be joint laxity which if continues to persist may require orthopedic input.  For now localized interventions discussed along with PRN anti-inflammatory medicine.

## 2017-09-04 ENCOUNTER — Ambulatory Visit: Payer: 59 | Admitting: Nurse Practitioner

## 2017-10-13 DIAGNOSIS — Z01419 Encounter for gynecological examination (general) (routine) without abnormal findings: Secondary | ICD-10-CM | POA: Diagnosis not present

## 2018-01-05 ENCOUNTER — Encounter (HOSPITAL_COMMUNITY): Payer: Self-pay | Admitting: Emergency Medicine

## 2018-01-05 ENCOUNTER — Ambulatory Visit (HOSPITAL_COMMUNITY): Payer: 59

## 2018-01-05 ENCOUNTER — Ambulatory Visit (INDEPENDENT_AMBULATORY_CARE_PROVIDER_SITE_OTHER): Payer: 59

## 2018-01-05 ENCOUNTER — Ambulatory Visit (HOSPITAL_COMMUNITY)
Admission: EM | Admit: 2018-01-05 | Discharge: 2018-01-05 | Disposition: A | Discharge status: Home / Self Care | Payer: 59 | Attending: Family Medicine | Admitting: Family Medicine

## 2018-01-05 DIAGNOSIS — M79604 Pain in right leg: Secondary | ICD-10-CM

## 2018-01-05 DIAGNOSIS — R51 Headache: Secondary | ICD-10-CM | POA: Diagnosis not present

## 2018-01-05 DIAGNOSIS — S299XXA Unspecified injury of thorax, initial encounter: Secondary | ICD-10-CM | POA: Diagnosis not present

## 2018-01-05 DIAGNOSIS — M546 Pain in thoracic spine: Secondary | ICD-10-CM

## 2018-01-05 DIAGNOSIS — R519 Headache, unspecified: Secondary | ICD-10-CM

## 2018-01-05 MED ORDER — NAPROXEN 500 MG PO TABS: 500.0000 mg | ORAL_TABLET | Freq: Two times a day (BID) | ORAL | 0 refills | Status: DC

## 2018-01-05 MED ORDER — CYCLOBENZAPRINE HCL 10 MG PO TABS: 10.0000 mg | ORAL_TABLET | Freq: Two times a day (BID) | ORAL | 0 refills | Status: DC | PRN

## 2018-01-05 NOTE — Discharge Instructions (Signed)
Use anti-inflammatories for pain/swelling. You may try aleeveYou may supplement Ibuprofen with Tylenol [phone] mg every 8 hours.   You may use flexeril as needed to help with pain. This is a muscle relaxer and causes sedation- please use only at bedtime or when you will be home and not have to drive/work- Begin with 1/2 tablet  Alternate Ice and heat  Follow up if symptoms not improving as expected, developing worsening headache, weakness, vision changes, numbness, tingling
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
## 2018-01-08 ENCOUNTER — Ambulatory Visit (INDEPENDENT_AMBULATORY_CARE_PROVIDER_SITE_OTHER): Payer: 59

## 2018-01-08 ENCOUNTER — Encounter (HOSPITAL_COMMUNITY): Payer: Self-pay | Admitting: *Deleted

## 2018-01-08 ENCOUNTER — Ambulatory Visit (HOSPITAL_COMMUNITY)
Admission: EM | Admit: 2018-01-08 | Discharge: 2018-01-08 | Disposition: A | Discharge status: Home / Self Care | Payer: 59 | Attending: Family Medicine | Admitting: Family Medicine

## 2018-01-08 ENCOUNTER — Other Ambulatory Visit: Payer: Self-pay

## 2018-01-08 DIAGNOSIS — S161XXD Strain of muscle, fascia and tendon at neck level, subsequent encounter: Secondary | ICD-10-CM | POA: Diagnosis not present

## 2018-01-08 DIAGNOSIS — M7918 Myalgia, other site: Secondary | ICD-10-CM

## 2018-01-08 DIAGNOSIS — R0781 Pleurodynia: Secondary | ICD-10-CM

## 2018-01-08 DIAGNOSIS — S199XXA Unspecified injury of neck, initial encounter: Secondary | ICD-10-CM | POA: Diagnosis not present

## 2018-01-08 MED ORDER — TRAMADOL HCL 50 MG PO TABS: 50.0000 mg | ORAL_TABLET | Freq: Four times a day (QID) | ORAL | 0 refills | Status: DC | PRN

## 2018-01-08 MED ORDER — TIZANIDINE HCL 4 MG PO TABS: 4.0000 mg | ORAL_TABLET | Freq: Four times a day (QID) | ORAL | 0 refills | Status: DC | PRN

## 2018-01-08 NOTE — Discharge Instructions (Signed)
May continue Naprosyn every 12 hours.  This is an anti-inflammatory pain medicine Stop cyclobenzaprine (it does not appear to be working) Start tizanidine as Academic librarian.  This can cause drowsiness, be careful taking before work.  May take at bedtime You may take tramadol as needed for pain not controlled by the Naprosyn.  Caution drowsiness.  Take with food Activity as tolerated.   Ice or heat to painful muscles
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
## 2018-01-15 ENCOUNTER — Ambulatory Visit (INDEPENDENT_AMBULATORY_CARE_PROVIDER_SITE_OTHER): Payer: 59 | Admitting: Family Medicine

## 2018-01-15 ENCOUNTER — Encounter: Payer: Self-pay | Admitting: Family Medicine

## 2018-01-15 DIAGNOSIS — M542 Cervicalgia: Secondary | ICD-10-CM | POA: Diagnosis not present

## 2018-01-15 DIAGNOSIS — S161XXA Strain of muscle, fascia and tendon at neck level, initial encounter: Secondary | ICD-10-CM

## 2018-01-15 MED ORDER — TIZANIDINE HCL 4 MG PO TABS: 4.0000 mg | ORAL_TABLET | Freq: Three times a day (TID) | ORAL | 0 refills | Status: DC | PRN

## 2018-01-15 MED ORDER — NAPROXEN 500 MG PO TABS: 500.0000 mg | ORAL_TABLET | Freq: Two times a day (BID) | ORAL | 0 refills | Status: DC

## 2018-01-15 MED ORDER — TRAMADOL HCL 50 MG PO TABS: 50.0000 mg | ORAL_TABLET | Freq: Four times a day (QID) | ORAL | 0 refills | Status: DC | PRN

## 2018-01-15 MED ORDER — TIZANIDINE HCL 4 MG PO TABS: 4.0000 mg | ORAL_TABLET | Freq: Four times a day (QID) | ORAL | 0 refills | Status: DC | PRN
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
## 2018-01-18 ENCOUNTER — Telehealth: Payer: Self-pay | Admitting: Family Medicine

## 2018-01-18 ENCOUNTER — Encounter: Payer: Self-pay | Admitting: Family Medicine

## 2018-01-18 NOTE — Telephone Encounter (Signed)
Pt states she had asked to be put on half days for work but not feels like she needs to be out of work.   Didn't mention for how long  Please advise & call pt when work note is ready

## 2018-01-18 NOTE — Telephone Encounter (Signed)
May write patient out of work til her f/u appt with Dr. Brett CanalesSteve

## 2018-01-19 NOTE — Telephone Encounter (Signed)
Printed work note and pt came and picked it up

## 2018-01-23 DIAGNOSIS — M545 Low back pain: Secondary | ICD-10-CM | POA: Diagnosis not present

## 2018-01-23 DIAGNOSIS — M542 Cervicalgia: Secondary | ICD-10-CM | POA: Diagnosis not present

## 2018-01-23 DIAGNOSIS — M5412 Radiculopathy, cervical region: Secondary | ICD-10-CM | POA: Diagnosis not present

## 2018-01-24 DIAGNOSIS — M542 Cervicalgia: Secondary | ICD-10-CM | POA: Diagnosis not present

## 2018-01-24 DIAGNOSIS — M545 Low back pain: Secondary | ICD-10-CM | POA: Diagnosis not present

## 2018-01-24 DIAGNOSIS — M5412 Radiculopathy, cervical region: Secondary | ICD-10-CM | POA: Diagnosis not present

## 2018-01-25 DIAGNOSIS — M5412 Radiculopathy, cervical region: Secondary | ICD-10-CM | POA: Diagnosis not present

## 2018-01-25 DIAGNOSIS — M542 Cervicalgia: Secondary | ICD-10-CM | POA: Diagnosis not present

## 2018-01-25 DIAGNOSIS — M545 Low back pain: Secondary | ICD-10-CM | POA: Diagnosis not present

## 2018-01-29 ENCOUNTER — Ambulatory Visit: Payer: 59 | Admitting: Family Medicine

## 2018-01-29 ENCOUNTER — Encounter: Payer: Self-pay | Admitting: Family Medicine

## 2018-01-29 VITALS — BP 136/98 | Ht 64.5 in | Wt 211.0 lb

## 2018-01-29 DIAGNOSIS — S161XXD Strain of muscle, fascia and tendon at neck level, subsequent encounter: Secondary | ICD-10-CM

## 2018-01-29 DIAGNOSIS — M542 Cervicalgia: Secondary | ICD-10-CM | POA: Diagnosis not present

## 2018-01-29 DIAGNOSIS — M545 Low back pain: Secondary | ICD-10-CM | POA: Diagnosis not present

## 2018-01-29 DIAGNOSIS — M5412 Radiculopathy, cervical region: Secondary | ICD-10-CM | POA: Diagnosis not present

## 2018-01-29 MED ORDER — NAPROXEN 500 MG PO TABS: 500.0000 mg | ORAL_TABLET | Freq: Two times a day (BID) | ORAL | 0 refills | Status: DC
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
## 2018-01-30 DIAGNOSIS — M542 Cervicalgia: Secondary | ICD-10-CM | POA: Diagnosis not present

## 2018-01-30 DIAGNOSIS — M5412 Radiculopathy, cervical region: Secondary | ICD-10-CM | POA: Diagnosis not present

## 2018-01-30 DIAGNOSIS — M545 Low back pain: Secondary | ICD-10-CM | POA: Diagnosis not present

## 2018-01-31 DIAGNOSIS — M542 Cervicalgia: Secondary | ICD-10-CM | POA: Diagnosis not present

## 2018-01-31 DIAGNOSIS — M5412 Radiculopathy, cervical region: Secondary | ICD-10-CM | POA: Diagnosis not present

## 2018-01-31 DIAGNOSIS — M545 Low back pain: Secondary | ICD-10-CM | POA: Diagnosis not present

## 2018-02-02 ENCOUNTER — Telehealth: Payer: Self-pay | Admitting: Family Medicine

## 2018-02-02 NOTE — Telephone Encounter (Signed)
Patient had short-term disability forms faxed over to be filled out please review fill in highlighted areas,date,sign in your yellow folder.

## 2018-02-06 DIAGNOSIS — M5412 Radiculopathy, cervical region: Secondary | ICD-10-CM | POA: Diagnosis not present

## 2018-02-06 DIAGNOSIS — M545 Low back pain: Secondary | ICD-10-CM | POA: Diagnosis not present

## 2018-02-06 DIAGNOSIS — M542 Cervicalgia: Secondary | ICD-10-CM | POA: Diagnosis not present

## 2018-02-07 DIAGNOSIS — M5412 Radiculopathy, cervical region: Secondary | ICD-10-CM | POA: Diagnosis not present

## 2018-02-07 DIAGNOSIS — M545 Low back pain: Secondary | ICD-10-CM | POA: Diagnosis not present

## 2018-02-07 DIAGNOSIS — M542 Cervicalgia: Secondary | ICD-10-CM | POA: Diagnosis not present

## 2018-02-08 ENCOUNTER — Ambulatory Visit (HOSPITAL_COMMUNITY): Payer: 59

## 2018-02-08 DIAGNOSIS — M542 Cervicalgia: Secondary | ICD-10-CM | POA: Diagnosis not present

## 2018-02-08 DIAGNOSIS — M5412 Radiculopathy, cervical region: Secondary | ICD-10-CM | POA: Diagnosis not present

## 2018-02-08 DIAGNOSIS — M545 Low back pain: Secondary | ICD-10-CM | POA: Diagnosis not present

## 2018-02-12 DIAGNOSIS — M545 Low back pain: Secondary | ICD-10-CM | POA: Diagnosis not present

## 2018-02-12 DIAGNOSIS — M542 Cervicalgia: Secondary | ICD-10-CM | POA: Diagnosis not present

## 2018-02-12 DIAGNOSIS — M5412 Radiculopathy, cervical region: Secondary | ICD-10-CM | POA: Diagnosis not present

## 2018-02-13 ENCOUNTER — Encounter: Payer: Self-pay | Admitting: Family Medicine

## 2018-02-13 ENCOUNTER — Ambulatory Visit: Payer: 59 | Admitting: Family Medicine

## 2018-02-13 VITALS — BP 130/90 | Ht 64.5 in | Wt 214.0 lb

## 2018-02-13 DIAGNOSIS — M542 Cervicalgia: Secondary | ICD-10-CM

## 2018-02-13 DIAGNOSIS — S161XXD Strain of muscle, fascia and tendon at neck level, subsequent encounter: Secondary | ICD-10-CM

## 2018-02-13 DIAGNOSIS — M545 Low back pain: Secondary | ICD-10-CM | POA: Diagnosis not present

## 2018-02-13 DIAGNOSIS — M5412 Radiculopathy, cervical region: Secondary | ICD-10-CM | POA: Diagnosis not present
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
## 2018-02-15 DIAGNOSIS — M545 Low back pain: Secondary | ICD-10-CM | POA: Diagnosis not present

## 2018-02-15 DIAGNOSIS — M542 Cervicalgia: Secondary | ICD-10-CM | POA: Diagnosis not present

## 2018-02-15 DIAGNOSIS — M5412 Radiculopathy, cervical region: Secondary | ICD-10-CM | POA: Diagnosis not present

## 2018-03-01 ENCOUNTER — Ambulatory Visit: Payer: 59 | Admitting: Family Medicine

## 2018-03-01 DIAGNOSIS — M5412 Radiculopathy, cervical region: Secondary | ICD-10-CM | POA: Diagnosis not present

## 2018-03-01 DIAGNOSIS — M542 Cervicalgia: Secondary | ICD-10-CM | POA: Diagnosis not present

## 2018-03-01 DIAGNOSIS — M545 Low back pain: Secondary | ICD-10-CM | POA: Diagnosis not present

## 2018-03-05 DIAGNOSIS — M542 Cervicalgia: Secondary | ICD-10-CM | POA: Diagnosis not present

## 2018-03-05 DIAGNOSIS — M5412 Radiculopathy, cervical region: Secondary | ICD-10-CM | POA: Diagnosis not present

## 2018-03-05 DIAGNOSIS — M545 Low back pain: Secondary | ICD-10-CM | POA: Diagnosis not present

## 2018-03-08 DIAGNOSIS — M545 Low back pain: Secondary | ICD-10-CM | POA: Diagnosis not present

## 2018-03-08 DIAGNOSIS — M5412 Radiculopathy, cervical region: Secondary | ICD-10-CM | POA: Diagnosis not present

## 2018-03-08 DIAGNOSIS — M542 Cervicalgia: Secondary | ICD-10-CM | POA: Diagnosis not present

## 2018-03-12 ENCOUNTER — Ambulatory Visit: Payer: 59 | Admitting: Family Medicine

## 2018-03-14 ENCOUNTER — Ambulatory Visit: Payer: 59 | Admitting: Family Medicine

## 2018-03-14 ENCOUNTER — Encounter: Payer: Self-pay | Admitting: Family Medicine

## 2018-03-14 VITALS — BP 122/74 | Temp 98.9°F | Ht 64.5 in | Wt 215.0 lb

## 2018-03-14 DIAGNOSIS — L0291 Cutaneous abscess, unspecified: Secondary | ICD-10-CM

## 2018-03-14 MED ORDER — DOXYCYCLINE HYCLATE 100 MG PO TABS: 100.0000 mg | ORAL_TABLET | Freq: Two times a day (BID) | ORAL | 0 refills | Status: DC

## 2018-03-14 NOTE — Progress Notes (Signed)
   Subjective:    Patient ID: Melanie Mccoy, female    DOB: [birth year], 28 y.o.   MRN: [id number]  HPICyst on buttock. Cyst has always been there but in the past few days it became painful. Tried witch hazel, warm compresses and heating pad.   Has always had a "knot" underneath right buttock for years. Has noticed in getting bigger and more tender the last few days. No purulent drainage.  Review of Systems  Constitutional: Negative for fever.  Musculoskeletal: Negative for myalgias.  Skin:       See HPI       Objective:   Physical Exam Vitals signs and nursing note reviewed.  Constitutional:      General: She is not in acute distress.    Appearance: She is well-developed.  HENT:     Head: Normocephalic and atraumatic.  Neck:     Musculoskeletal: Neck supple.  Cardiovascular:     Rate and Rhythm: Normal rate and regular rhythm.     Heart sounds: Normal heart sounds. No murmur.  Pulmonary:     Effort: Pulmonary effort is normal. No respiratory distress.     Breath sounds: Normal breath sounds.  Skin:    General: Skin is warm and dry.     Comments: Large area of erythema and induration noted to right posterior upper thigh, approx 2 inches in diameter, small area of fluctuance noted. No evidence of drainage.   Neurological:     Mental Status: She is alert and oriented to person, place, and time.    Procedure Note: Verbal consent obtained. Procedure performed by Dr. Lubertha South. Area numbed with 1% lidocaine with epi. Small incision made and copious amounts of pus drained. Area packed and dressing applied.     Assessment & Plan:  Abscess  I&D performed as noted above. Pt will remove packing tomorrow, instructed on proper care. Doxycycline x 7 days. Warning signs discussed. F/u if symptoms worsen or fail to improve. Will notify us if she decides she wants referral for cyst removal.   Dr. Lubertha South was consulted on this case and is in agreement with the above  treatment plan.

## 2018-03-22 DIAGNOSIS — M5412 Radiculopathy, cervical region: Secondary | ICD-10-CM | POA: Diagnosis not present

## 2018-03-22 DIAGNOSIS — M545 Low back pain: Secondary | ICD-10-CM | POA: Diagnosis not present

## 2018-03-22 DIAGNOSIS — M542 Cervicalgia: Secondary | ICD-10-CM | POA: Diagnosis not present

## 2018-03-23 ENCOUNTER — Ambulatory Visit: Payer: 59 | Admitting: Family Medicine

## 2018-04-10 DIAGNOSIS — M5412 Radiculopathy, cervical region: Secondary | ICD-10-CM | POA: Diagnosis not present

## 2018-04-10 DIAGNOSIS — M542 Cervicalgia: Secondary | ICD-10-CM | POA: Diagnosis not present

## 2018-04-10 DIAGNOSIS — M545 Low back pain: Secondary | ICD-10-CM | POA: Diagnosis not present

## 2018-04-12 DIAGNOSIS — M5412 Radiculopathy, cervical region: Secondary | ICD-10-CM | POA: Diagnosis not present

## 2018-04-12 DIAGNOSIS — M545 Low back pain: Secondary | ICD-10-CM | POA: Diagnosis not present

## 2018-04-12 DIAGNOSIS — M542 Cervicalgia: Secondary | ICD-10-CM | POA: Diagnosis not present

## 2018-04-16 ENCOUNTER — Other Ambulatory Visit: Payer: Self-pay | Admitting: Family Medicine

## 2018-04-17 ENCOUNTER — Ambulatory Visit: Payer: 59 | Admitting: Family Medicine

## 2018-04-17 ENCOUNTER — Encounter: Payer: Self-pay | Admitting: Family Medicine

## 2018-04-17 VITALS — BP 134/92 | Ht 64.5 in | Wt 216.0 lb

## 2018-04-17 DIAGNOSIS — R030 Elevated blood-pressure reading, without diagnosis of hypertension: Secondary | ICD-10-CM

## 2018-04-17 DIAGNOSIS — E1169 Type 2 diabetes mellitus with other specified complication: Secondary | ICD-10-CM

## 2018-04-17 DIAGNOSIS — E785 Hyperlipidemia, unspecified: Secondary | ICD-10-CM

## 2018-04-17 DIAGNOSIS — E1165 Type 2 diabetes mellitus with hyperglycemia: Secondary | ICD-10-CM

## 2018-04-17 DIAGNOSIS — Z79899 Other long term (current) drug therapy: Secondary | ICD-10-CM

## 2018-04-17 LAB — POCT GLYCOSYLATED HEMOGLOBIN (HGB A1C): Hemoglobin A1C: 8.3 % — AB (ref 4.0–5.6)

## 2018-04-17 MED ORDER — METFORMIN HCL 1000 MG PO TABS: 1000.0000 mg | ORAL_TABLET | Freq: Two times a day (BID) | ORAL | 3 refills | Status: DC

## 2018-04-17 NOTE — Patient Instructions (Addendum)
-Increase your Metformin to 1 tablet twice a day with meals for the next week. Then increase to 2 tablets (1000 mg total) twice a day with meals until finished with the 500 mg tablets. I will refill your Metformin at the 1000 mg dose, so at that point you will only take 1 tablet twice a day with meals.  -Keep track of your blood sugars - check them a few times per week fasting in the morning before breakfast, call us in 2-3 weeks with these results please. -Goal of fasting blood sugar is 90-130 -Work hard on healthy eating and exercise.   DASH Eating Plan DASH stands for "Dietary Approaches to Stop Hypertension." The DASH eating plan is a healthy eating plan that has been shown to reduce high blood pressure (hypertension). It may also reduce your risk for type 2 diabetes, heart disease, and stroke. The DASH eating plan may also help with weight loss. What are tips for following this plan?  General guidelines  Avoid eating more than 2,300 mg (milligrams) of salt (sodium) a day. If you have hypertension, you may need to reduce your sodium intake to 1,500 mg a day.  Limit alcohol intake to no more than 1 drink a day for nonpregnant women and 2 drinks a day for men. One drink equals 12 oz of beer, 5 oz of wine, or 1 oz of hard liquor.  Work with your health care provider to maintain a healthy body weight or to lose weight. Ask what an ideal weight is for you.  Get at least 30 minutes of exercise that causes your heart to beat faster (aerobic exercise) most days of the week. Activities may include walking, swimming, or biking.  Work with your health care provider or diet and nutrition specialist (dietitian) to adjust your eating plan to your individual calorie needs. Reading food labels   Check food labels for the amount of sodium per serving. Choose foods with less than 5 percent of the Daily Value of sodium. Generally, foods with less than 300 mg of sodium per serving fit into this eating  plan.  To find whole grains, look for the word "whole" as the first word in the ingredient list. Shopping  Buy products labeled as "low-sodium" or "no salt added."  Buy fresh foods. Avoid canned foods and premade or frozen meals. Cooking  Avoid adding salt when cooking. Use salt-free seasonings or herbs instead of table salt or sea salt. Check with your health care provider or pharmacist before using salt substitutes.  Do not fry foods. Cook foods using healthy methods such as baking, boiling, grilling, and broiling instead.  Cook with heart-healthy oils, such as olive, canola, soybean, or sunflower oil. Meal planning  Eat a balanced diet that includes: ? 5 or more servings of fruits and vegetables each day. At each meal, try to fill half of your plate with fruits and vegetables. ? Up to 6-8 servings of whole grains each day. ? Less than 6 oz of lean meat, poultry, or fish each day. A 3-oz serving of meat is about the same size as a deck of cards. One egg equals 1 oz. ? 2 servings of low-fat dairy each day. ? A serving of nuts, seeds, or beans 5 times each week. ? Heart-healthy fats. Healthy fats called Omega-3 fatty acids are found in foods such as flaxseeds and coldwater fish, like sardines, salmon, and mackerel.  Limit how much you eat of the following: ? Canned or prepackaged foods. ?  Food that is high in trans fat, such as fried foods. ? Food that is high in saturated fat, such as fatty meat. ? Sweets, desserts, sugary drinks, and other foods with added sugar. ? Full-fat dairy products.  Do not salt foods before eating.  Try to eat at least 2 vegetarian meals each week.  Eat more home-cooked food and less restaurant, buffet, and fast food.  When eating at a restaurant, ask that your food be prepared with less salt or no salt, if possible. What foods are recommended? The items listed may not be a complete list. Talk with your dietitian about what dietary choices are best  for you. Grains Whole-grain or whole-wheat bread. Whole-grain or whole-wheat pasta. Brown rice. [place]. Bulgur. Whole-grain and low-sodium cereals. Pita bread. Low-fat, low-sodium crackers. Whole-wheat flour tortillas. Vegetables Fresh or frozen vegetables (raw, steamed, roasted, or grilled). Low-sodium or reduced-sodium tomato and vegetable juice. Low-sodium or reduced-sodium tomato sauce and tomato paste. Low-sodium or reduced-sodium canned vegetables. Fruits All fresh, dried, or frozen fruit. Canned fruit in natural juice (without added sugar). Meat and other protein foods Skinless chicken or [place]. Ground chicken or [place]. Pork with fat trimmed off. Fish and seafood. Egg whites. Dried beans, peas, or lentils. Unsalted nuts, nut butters, and seeds. Unsalted canned beans. Lean cuts of beef with fat trimmed off. Low-sodium, lean deli meat. Dairy Low-fat (1%) or fat-free (skim) milk. Fat-free, low-fat, or reduced-fat cheeses. Nonfat, low-sodium ricotta or cottage cheese. Low-fat or nonfat yogurt. Low-fat, low-sodium cheese. Fats and oils Soft margarine without trans fats. Vegetable oil. Low-fat, reduced-fat, or light mayonnaise and salad dressings (reduced-sodium). Canola, safflower, olive, soybean, and sunflower oils. Avocado. Seasoning and other foods Herbs. Spices. Seasoning mixes without salt. Unsalted popcorn and pretzels. Fat-free sweets. What foods are not recommended? The items listed may not be a complete list. Talk with your dietitian about what dietary choices are best for you. Grains Baked goods made with fat, such as croissants, muffins, or some breads. Dry pasta or rice meal packs. Vegetables Creamed or fried vegetables. Vegetables in a cheese sauce. Regular canned vegetables (not low-sodium or reduced-sodium). Regular canned tomato sauce and paste (not low-sodium or reduced-sodium). Regular tomato and vegetable juice (not low-sodium or reduced-sodium). Angie Fava.  Olives. Fruits Canned fruit in a light or heavy syrup. Fried fruit. Fruit in cream or butter sauce. Meat and other protein foods Fatty cuts of meat. Ribs. Fried meat. Berniece Salines. Sausage. Bologna and other processed lunch meats. Salami. Fatback. Hotdogs. Bratwurst. Salted nuts and seeds. Canned beans with added salt. Canned or smoked fish. Whole eggs or egg yolks. Chicken or [place] with skin. Dairy Whole or 2% milk, cream, and half-and-half. Whole or full-fat cream cheese. Whole-fat or sweetened yogurt. Full-fat cheese. Nondairy creamers. Whipped toppings. Processed cheese and cheese spreads. Fats and oils Butter. Stick margarine. Lard. Shortening. Ghee. Bacon fat. Tropical oils, such as coconut, palm kernel, or palm oil. Seasoning and other foods Salted popcorn and pretzels. Onion salt, garlic salt, seasoned salt, table salt, and sea salt. Worcestershire sauce. Tartar sauce. Barbecue sauce. Teriyaki sauce. Soy sauce, including reduced-sodium. Steak sauce. Canned and packaged gravies. Fish sauce. Oyster sauce. Cocktail sauce. Horseradish that you find on the shelf. Ketchup. Mustard. Meat flavorings and tenderizers. Bouillon cubes. Hot sauce and Tabasco sauce. Premade or packaged marinades. Premade or packaged taco seasonings. Relishes. Regular salad dressings. Where to find more information:  National Heart, Lung, and [place]: https://wilson-eaton.com/  American Heart Association: www.heart.org Summary  The DASH eating plan is  a healthy eating plan that has been shown to reduce high blood pressure (hypertension). It may also reduce your risk for type 2 diabetes, heart disease, and stroke.  With the DASH eating plan, you should limit salt (sodium) intake to 2,300 mg a day. If you have hypertension, you may need to reduce your sodium intake to 1,500 mg a day.  When on the DASH eating plan, aim to eat more fresh fruits and vegetables, whole grains, lean proteins, low-fat dairy, and heart-healthy  fats.  Work with your health care provider or diet and nutrition specialist (dietitian) to adjust your eating plan to your individual calorie needs. This information is not intended to replace advice given to you by your health care provider. Make sure you discuss any questions you have with your health care provider. Document Released: 02/10/2011 Document Revised: 02/15/2016 Document Reviewed: 02/15/2016 Elsevier Interactive Patient Education  2019 [employer] Inc[employer]  Diabetes Mellitus and Exercise Exercising regularly is important for your overall health, especially when you have diabetes (diabetes mellitus). Exercising is not only about losing weight. It has many other health benefits, such as increasing muscle strength and bone density and reducing body fat and stress. This leads to improved fitness, flexibility, and endurance, all of which result in better overall health. Exercise has additional benefits for people with diabetes, including:  Reducing appetite.  Helping to lower and control blood glucose.  Lowering blood pressure.  Helping to control amounts of fatty substances (lipids) in the blood, such as cholesterol and triglycerides.  Helping the body to respond better to insulin (improving insulin sensitivity).  Reducing how much insulin the body needs.  Decreasing the risk for heart disease by: ? Lowering cholesterol and triglyceride levels. ? Increasing the levels of good cholesterol. ? Lowering blood glucose levels. What is my activity plan? Your health care provider or certified diabetes educator can help you make a plan for the type and frequency of exercise (activity plan) that works for you. Make sure that you:  Do at least 150 minutes of moderate-intensity or vigorous-intensity exercise each week. This could be brisk walking, biking, or water aerobics. ? Do stretching and strength exercises, such as yoga or weightlifting, at least 2 times a week. ? Spread out your  activity over at least 3 days of the week.  Get some form of physical activity every day. ? Do not go more than 2 days in a row without some kind of physical activity. ? Avoid being inactive for more than 30 minutes at a time. Take frequent breaks to walk or stretch.  Choose a type of exercise or activity that you enjoy, and set realistic goals.  Start slowly, and gradually increase the intensity of your exercise over time. What do I need to know about managing my diabetes?   Check your blood glucose before and after exercising. ? If your blood glucose is 240 mg/dL (82.913.3 mmol/L) or higher before you exercise, check your urine for ketones. If you have ketones in your urine, do not exercise until your blood glucose returns to normal. ? If your blood glucose is 100 mg/dL (5.6 mmol/L) or lower, eat a snack containing 15-20 grams of carbohydrate. Check your blood glucose 15 minutes after the snack to make sure that your level is above 100 mg/dL (5.6 mmol/L) before you start your exercise.  Know the symptoms of low blood glucose (hypoglycemia) and how to treat it. Your risk for hypoglycemia increases during and after exercise. Common symptoms of hypoglycemia can include: ?  Hunger. ? Anxiety. ? Sweating and feeling clammy. ? Confusion. ? Dizziness or feeling light-headed. ? Increased heart rate or palpitations. ? Blurry vision. ? Tingling or numbness around the mouth, lips, or tongue. ? Tremors or shakes. ? Irritability.  Keep a rapid-acting carbohydrate snack available before, during, and after exercise to help prevent or treat hypoglycemia.  Avoid injecting insulin into areas of the body that are going to be exercised. For example, avoid injecting insulin into: ? The arms, when playing tennis. ? The legs, when jogging.  Keep records of your exercise habits. Doing this can help you and your health care provider adjust your diabetes management plan as needed. Write down: ? Food that you  eat before and after you exercise. ? Blood glucose levels before and after you exercise. ? The type and amount of exercise you have done. ? When your insulin is expected to peak, if you use insulin. Avoid exercising at times when your insulin is peaking.  When you start a new exercise or activity, work with your health care provider to make sure the activity is safe for you, and to adjust your insulin, medicines, or food intake as needed.  Drink plenty of water while you exercise to prevent dehydration or heat stroke. Drink enough fluid to keep your urine clear or pale yellow. Summary  Exercising regularly is important for your overall health, especially when you have diabetes (diabetes mellitus).  Exercising has many health benefits, such as increasing muscle strength and bone density and reducing body fat and stress.  Your health care provider or certified diabetes educator can help you make a plan for the type and frequency of exercise (activity plan) that works for you.  When you start a new exercise or activity, work with your health care provider to make sure the activity is safe for you, and to adjust your insulin, medicines, or food intake as needed. This information is not intended to replace advice given to you by your health care provider. Make sure you discuss any questions you have with your health care provider. Document Released: 05/14/2003 Document Revised: 09/01/2016 Document Reviewed: 08/03/2015 Elsevier Interactive Patient Education  2019 [employer]
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
## 2018-04-21 DIAGNOSIS — E785 Hyperlipidemia, unspecified: Secondary | ICD-10-CM | POA: Diagnosis not present

## 2018-04-21 DIAGNOSIS — E1169 Type 2 diabetes mellitus with other specified complication: Secondary | ICD-10-CM | POA: Diagnosis not present

## 2018-04-21 DIAGNOSIS — E1165 Type 2 diabetes mellitus with hyperglycemia: Secondary | ICD-10-CM | POA: Diagnosis not present

## 2018-04-22 LAB — LIPID PANEL
CHOLESTEROL TOTAL: 185 mg/dL (ref 100–199)
Chol/HDL Ratio: 4.9 ratio — ABNORMAL HIGH (ref 0.0–4.4)
HDL: 38 mg/dL — ABNORMAL LOW (ref >39)
LDL Calculated: 71 mg/dL (ref 0–99)
Triglycerides: 378 mg/dL — ABNORMAL HIGH (ref 0–149)
VLDL CHOLESTEROL CAL: 76 mg/dL — AB (ref 5–40)

## 2018-04-22 LAB — BASIC METABOLIC PANEL
BUN/Creatinine Ratio: 24 — ABNORMAL HIGH (ref 9–23)
BUN: 16 mg/dL (ref 6–20)
CO2: 20 mmol/L (ref 20–29)
Calcium: 9.7 mg/dL (ref 8.7–10.2)
Chloride: 100 mmol/L (ref 96–106)
Creatinine, Ser: 0.67 mg/dL (ref 0.57–1.00)
GFR calc Af Amer: 140 mL/min/{1.73_m2} (ref >59)
GFR, EST NON AFRICAN AMERICAN: 122 mL/min/{1.73_m2} (ref >59)
Glucose: 185 mg/dL — ABNORMAL HIGH (ref 65–99)
Potassium: 4.2 mmol/L (ref 3.5–5.2)
Sodium: 138 mmol/L (ref 134–144)

## 2018-04-22 LAB — HEPATIC FUNCTION PANEL
ALBUMIN: 4 g/dL (ref 3.9–5.0)
ALK PHOS: 72 IU/L (ref 39–117)
ALT: 30 IU/L (ref 0–32)
AST: 29 IU/L (ref 0–40)
Bilirubin, Direct: 0.08 mg/dL (ref 0.00–0.40)
Total Protein: 6.5 g/dL (ref 6.0–8.5)

## 2018-04-22 LAB — MICROALBUMIN / CREATININE URINE RATIO
Creatinine, Urine: 113.3 mg/dL
Microalb/Creat Ratio: 10 mg/g creat (ref 0–29)
Microalbumin, Urine: 10.9 ug/mL

## 2018-05-15 ENCOUNTER — Ambulatory Visit: Payer: 59 | Admitting: Family Medicine

## 2018-05-15 ENCOUNTER — Encounter: Payer: Self-pay | Admitting: Family Medicine

## 2018-05-15 VITALS — BP 148/110 | Temp 98.9°F | Ht 64.5 in | Wt 214.0 lb

## 2018-05-15 DIAGNOSIS — J111 Influenza due to unidentified influenza virus with other respiratory manifestations: Secondary | ICD-10-CM | POA: Diagnosis not present

## 2018-05-15 DIAGNOSIS — J019 Acute sinusitis, unspecified: Secondary | ICD-10-CM | POA: Diagnosis not present

## 2018-05-15 LAB — POCT RAPID STREP A (OFFICE): Rapid Strep A Screen: NEGATIVE

## 2018-05-15 MED ORDER — AMOXICILLIN-POT CLAVULANATE 875-125 MG PO TABS: 1.0000 | ORAL_TABLET | Freq: Two times a day (BID) | ORAL | 0 refills | Status: DC

## 2018-05-15 NOTE — Patient Instructions (Signed)

## 2018-05-15 NOTE — Progress Notes (Signed)
   Subjective:    Patient ID: Melanie Mccoy, female    DOB: 1990-05-21, 28 y.o.   MRN: [id number]  HPI  Patient is here today with complaints of a cough,head congestion, sore throat,bilateral ear pain,left ear pain is worse. Swollen jaw on the right side, body aches,chills,headache. Symptoms began on Thursday progressed into Friday into Saturday and Sunday denies wheezing shortness of breath Symptoms began on Thursday with a sore throat.  She is taking Vit c and airborne, occasional Tylenol.  Review of Systems  Constitutional: Positive for fatigue. Negative for activity change and fever.  HENT: Positive for congestion and rhinorrhea. Negative for ear pain.   Eyes: Negative for discharge.  Respiratory: Positive for cough. Negative for shortness of breath and wheezing.   Cardiovascular: Negative for chest pain.       Objective:   Physical Exam Vitals signs and nursing note reviewed.  Constitutional:      Appearance: She is well-developed.  HENT:     Head: Normocephalic.     Nose: Nose normal.     Mouth/Throat:     Pharynx: No oropharyngeal exudate.  Neck:     Musculoskeletal: Neck supple.  Cardiovascular:     Rate and Rhythm: Normal rate.     Heart sounds: Normal heart sounds. No murmur.  Pulmonary:     Effort: Pulmonary effort is normal.     Breath sounds: Normal breath sounds. No wheezing.  Lymphadenopathy:     Cervical: No cervical adenopathy.  Skin:    General: Skin is warm and dry.            Assessment & Plan:  Viral syndrome Secondary sinusitis Antibiotics prescribed for the sinusitis Probable flu Rapid strep negative Should gradually get better Warning signs were discussed in detail x-rays lab work not indicated currently

## 2018-05-17 ENCOUNTER — Telehealth: Payer: Self-pay | Admitting: *Deleted

## 2018-05-17 ENCOUNTER — Emergency Department (HOSPITAL_COMMUNITY)
Admission: EM | Admit: 2018-05-17 | Discharge: 2018-05-17 | Disposition: A | Discharge status: Home / Self Care | Payer: 59 | Attending: Emergency Medicine | Admitting: Emergency Medicine

## 2018-05-17 ENCOUNTER — Other Ambulatory Visit: Payer: Self-pay

## 2018-05-17 DIAGNOSIS — R05 Cough: Secondary | ICD-10-CM | POA: Diagnosis not present

## 2018-05-17 DIAGNOSIS — B349 Viral infection, unspecified: Secondary | ICD-10-CM

## 2018-05-17 DIAGNOSIS — J111 Influenza due to unidentified influenza virus with other respiratory manifestations: Secondary | ICD-10-CM | POA: Diagnosis not present

## 2018-05-17 NOTE — ED Provider Notes (Signed)
[facility] Emergency Department Provider Note MRN:  [id number]  Arrival date & time: 05/17/18     Chief Complaint   Influenza   History of Present Illness   Melanie Mccoy is a 28 y.o. year-old female with a history of diabetes presenting to the ED with chief complaint of influenza.  Patient has been experiencing flulike symptoms for about 1 week.  Nasal congestion, mild sore throat, body aches, cough, occasional subjective fever.  Was diagnosed with the flu by PCP few days ago, felt better for 1 day, then felt worse.  Return to PCP, who prescribed Augmentin.  Patient here out of concern because she was not officially tested for the flu, wants to make sure she does not have the coronavirus.  Symptoms are constant, no exacerbating relieving factors.  Review of Systems  A complete 10 system review of systems was obtained and all systems are negative except as noted in the HPI and PMH.   Patient's Health History    Past Medical History:  Diagnosis Date  . Anemia    slightly anemic, iron supplements every 2-3 days  . Bartholin cyst   . Chlamydia 01/2011  . Diabetes mellitus without complication (HCC)    gestational diagnosis 2nd trimester  . Family history of anesthesia complication    grandmother had spinal with nerve damage  . No pertinent past medical history     Past Surgical History:  Procedure Laterality Date  . CESAREAN SECTION N/A 11/22/2012   Procedure: PRIMARY CESAREAN SECTION;  Surgeon: Esmeralda Arthur, MD;  Location: [facility];  Service: Obstetrics;  Laterality: N/A;  . CYST REMOVAL TRUNK  2005    Family History  Problem Relation Age of Onset  . Cancer Other   . Heart disease Other   . Diabetes Other   . Diabetes Mother   . Diabetes Father   . Diabetes Maternal Grandfather     Social History   Socioeconomic History  . Marital status: Single    Spouse name: Not on file  . Number of children: Not on file  . Years of education: Not on file   . Highest education level: Not on file  Occupational History  . Not on file  Social Needs  . Financial resource strain: Not on file  . Food insecurity:    Worry: Not on file    Inability: Not on file  . Transportation needs:    Medical: Not on file    Non-medical: Not on file  Tobacco Use  . Smoking status: Never Smoker  . Smokeless tobacco: Never Used  Substance and Sexual Activity  . Alcohol use: No    Alcohol/week: 0.0 standard drinks  . Drug use: No  . Sexual activity: Yes  Lifestyle  . Physical activity:    Days per week: Not on file    Minutes per session: Not on file  . Stress: Not on file  Relationships  . Social connections:    Talks on phone: Not on file    Gets together: Not on file    Attends religious service: Not on file    Active member of club or organization: Not on file    Attends meetings of clubs or organizations: Not on file    Relationship status: Not on file  . Intimate partner violence:    Fear of current or ex partner: Not on file    Emotionally abused: Not on file    Physically abused: Not on file  Forced sexual activity: Not on file  Other Topics Concern  . Not on file  Social History Narrative  . Not on file     Physical Exam  Vital Signs and Nursing Notes reviewed Vitals:   05/17/18 1709  BP: (!) 156/101  Pulse: (!) 101  Resp: 16  Temp: 99.1 F (37.3 C)  SpO2: 100%    CONSTITUTIONAL: Well-appearing, NAD NEURO:  Alert and oriented x 3, no focal deficits, no meningismus EYES:  eyes equal and reactive ENT/NECK:  no LAD, no JVD CARDIO: Regular rate, well-perfused, normal S1 and S2 PULM:  CTAB no wheezing or rhonchi GI/GU:  normal bowel sounds, non-distended, non-tender MSK/SPINE:  No gross deformities, no edema SKIN:  no rash, atraumatic PSYCH:  Appropriate speech and behavior  Diagnostic and Interventional Summary    Labs Reviewed - No data to display  No orders to display    Medications - No data to display    Procedures Critical Care  ED Course and Medical Decision Making  I have reviewed the triage vital signs and the nursing notes.  Pertinent labs & imaging results that were available during my care of the patient were reviewed by me and considered in my medical decision making (see below for details).  Flulike symptoms, now on Augmentin given by PCP.  Lungs are clear, vital signs are normal, no increased respiratory effort, no respiratory distress, already on antibiotics, no indication for chest x-ray today.  Patient is walking well, per our guidelines not a candidate for novel coronavirus testing.  Provided reassurance.  After the discussed management above, the patient was determined to be safe for discharge.  The patient was in agreement with this plan and all questions regarding their care were answered.  ED return precautions were discussed and the patient will return to the ED with any significant worsening of condition.  Elmer Sow. Pilar Plate, MD [facility] Emergency Medicine [facility] [email or web address]  Final Clinical Impressions(s) / ED Diagnoses     ICD-10-CM   1. Viral syndrome B34.9     ED Discharge Orders    None         Sabas Sous, MD 05/17/18 [phone]

## 2018-05-17 NOTE — ED Notes (Signed)
Discharge instructions discussed with Pt. Pt verbalized understanding. Pt stable and ambulatory.    

## 2018-05-17 NOTE — Telephone Encounter (Signed)
I spoke with Melanie Mccoy at [employer] and she is aware of the pt coming in for possible concerns about Corona Virus.

## 2018-05-17 NOTE — Telephone Encounter (Signed)
Pt called and states she was diagnosed with flu 2 days ago and is worse today. Sob. Stopped breathing twice last night in her sleep. No fever, headache, runny nose, cough. Wants to know if she should be tested for coronavirus. Discussed with dr Lorin Picket and advised to go to ED since having sob.

## 2018-05-17 NOTE — ED Triage Notes (Signed)
Patient reports flu-like symptoms - congestion, cough, runny nose - states she was seen 2 days ago at [facility] and diagnosed with the flu, but she states she feels worse today. Denies CP, resp e/u, skin w/d. NAD noted in triage.

## 2018-05-17 NOTE — Discharge Instructions (Addendum)
You were evaluated in the Emergency Department and after careful evaluation, we did not find any emergent condition requiring admission or further testing in the hospital.  Your symptoms today seem to be due continued symptoms related to the flu.  Please continue to take the antibiotic prescribed here by your PCP, use Tylenol or ibuprofen as needed for discomfort.  Please return to the Emergency Department if you experience any worsening of your condition.  We encourage you to follow up with a primary care provider.  Thank you for allowing [place] to be a part of your care.

## 2018-05-18 LAB — CULTURE, GROUP A STREP: Strep A Culture: NEGATIVE

## 2018-07-16 ENCOUNTER — Encounter: Payer: Self-pay | Admitting: Family Medicine

## 2018-07-16 ENCOUNTER — Ambulatory Visit (INDEPENDENT_AMBULATORY_CARE_PROVIDER_SITE_OTHER): Payer: 59 | Admitting: Family Medicine

## 2018-07-16 ENCOUNTER — Other Ambulatory Visit: Payer: Self-pay

## 2018-07-16 VITALS — Wt 216.0 lb

## 2018-07-16 DIAGNOSIS — E1165 Type 2 diabetes mellitus with hyperglycemia: Secondary | ICD-10-CM

## 2018-07-16 MED ORDER — GLIPIZIDE ER 5 MG PO TB24: ORAL_TABLET | ORAL | 5 refills | Status: DC

## 2018-07-16 MED ORDER — DULAGLUTIDE 1.5 MG/0.5ML ~~LOC~~ SOAJ: SUBCUTANEOUS | 5 refills | Status: DC

## 2018-07-16 MED ORDER — METFORMIN HCL 1000 MG PO TABS: 1000.0000 mg | ORAL_TABLET | Freq: Two times a day (BID) | ORAL | 5 refills | Status: DC

## 2018-07-16 NOTE — Progress Notes (Signed)
   Subjective:    Patient ID: Melanie Mccoy, female    DOB: 07-08-1990, 28 y.o.   MRN: [id number] Audio plus visual Diabetes  She presents for her follow-up diabetic visit. She has type 2 diabetes mellitus. There are no hypoglycemic associated symptoms. There are no diabetic associated symptoms. There are no hypoglycemic complications. There are no diabetic complications. She participates in exercise weekly. She does not see a podiatrist.Eye exam is current (seen eye dr last year).   Virtual Visit via Video Note  I connected with Melanie Mccoy on 07/16/18 at 10:30 AM EDT by a video enabled telemedicine application and verified that I am speaking with the correct person using two identifiers.  Location: Patient: home Provider: office   I discussed the limitations of evaluation and management by telemedicine and the availability of in person appointments. The patient expressed understanding and agreed to proceed.  History of Present Illness:    Observations/Objective:   Assessment and Plan:   Follow Up Instructions:    I discussed the assessment and treatment plan with the patient. The patient was provided an opportunity to ask questions and all were answered. The patient agreed with the plan and demonstrated an understanding of the instructions.   The patient was advised to call back or seek an in-person evaluation if the symptoms worsen or if the condition fails to improve as anticipated.  I provided 20 minutes of non-face-to-face time during this encounter.  Patient claims compliance with diabetes medication. No obvious side effects. Reports no substantial low sugar spells. Most numbers are generally in still elevated range, often upper 100s, when checked fasting. Generally does not miss a dose of medication. Watching diabetic diet closely  Marlowe [place], LPN   No headache, no major weight loss or weight gain, no chest pain no back pain abdominal pain no change in  bowel habits complete ROS otherwise negative  Review of Systems     Objective:   Physical Exam  Virtual visit      Assessment & Plan:  Impression type 2 diabetes.  Suboptimal control discussed.  Diet discussed.  Exercise discussed.  Add glipizide.  Rationale discussed.  Recheck in 4 months

## 2018-07-27 ENCOUNTER — Ambulatory Visit: Payer: 59 | Admitting: Family Medicine

## 2018-11-09 IMAGING — DX DG ABDOMEN 2V
3 series · 3 of 3 positions shown · non-contrast
Comparison: None.

CLINICAL DATA: Abdominal pain 4 days

EXAM:
ABDOMEN - 2 VIEW

[abdomen erect]
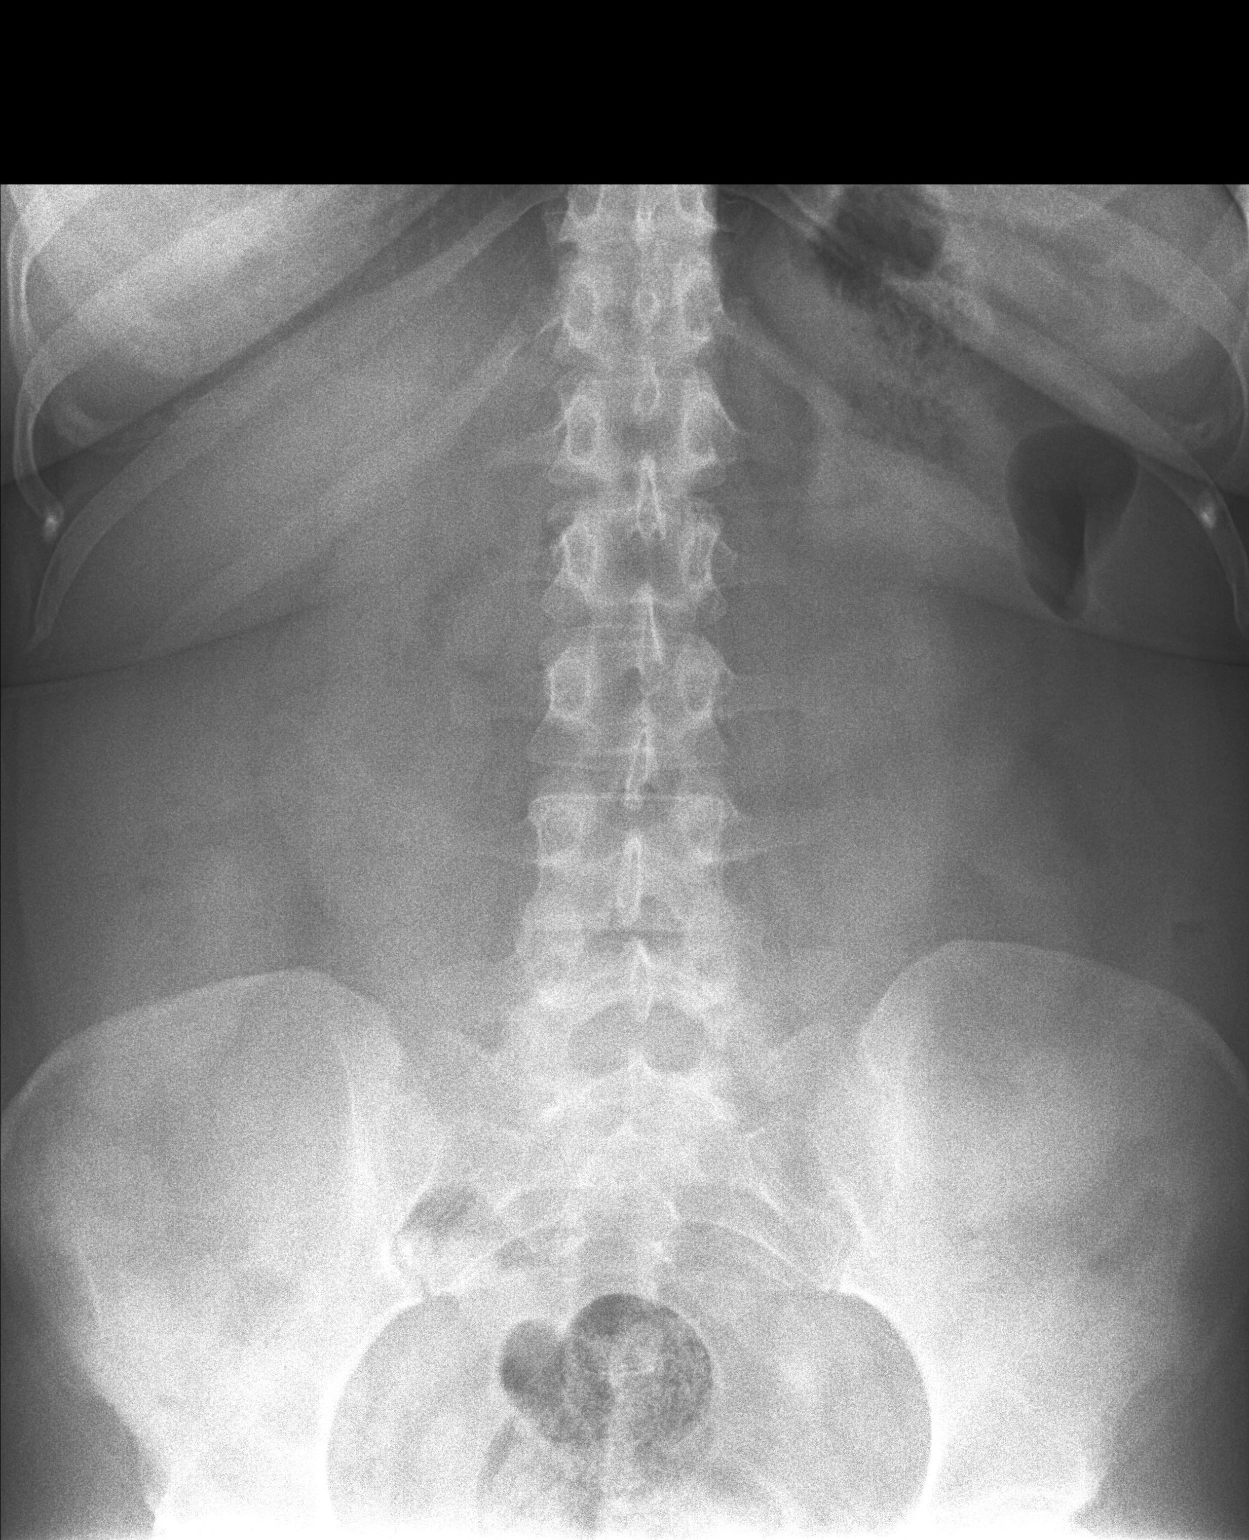

[abdomen supine (1 of 2)]
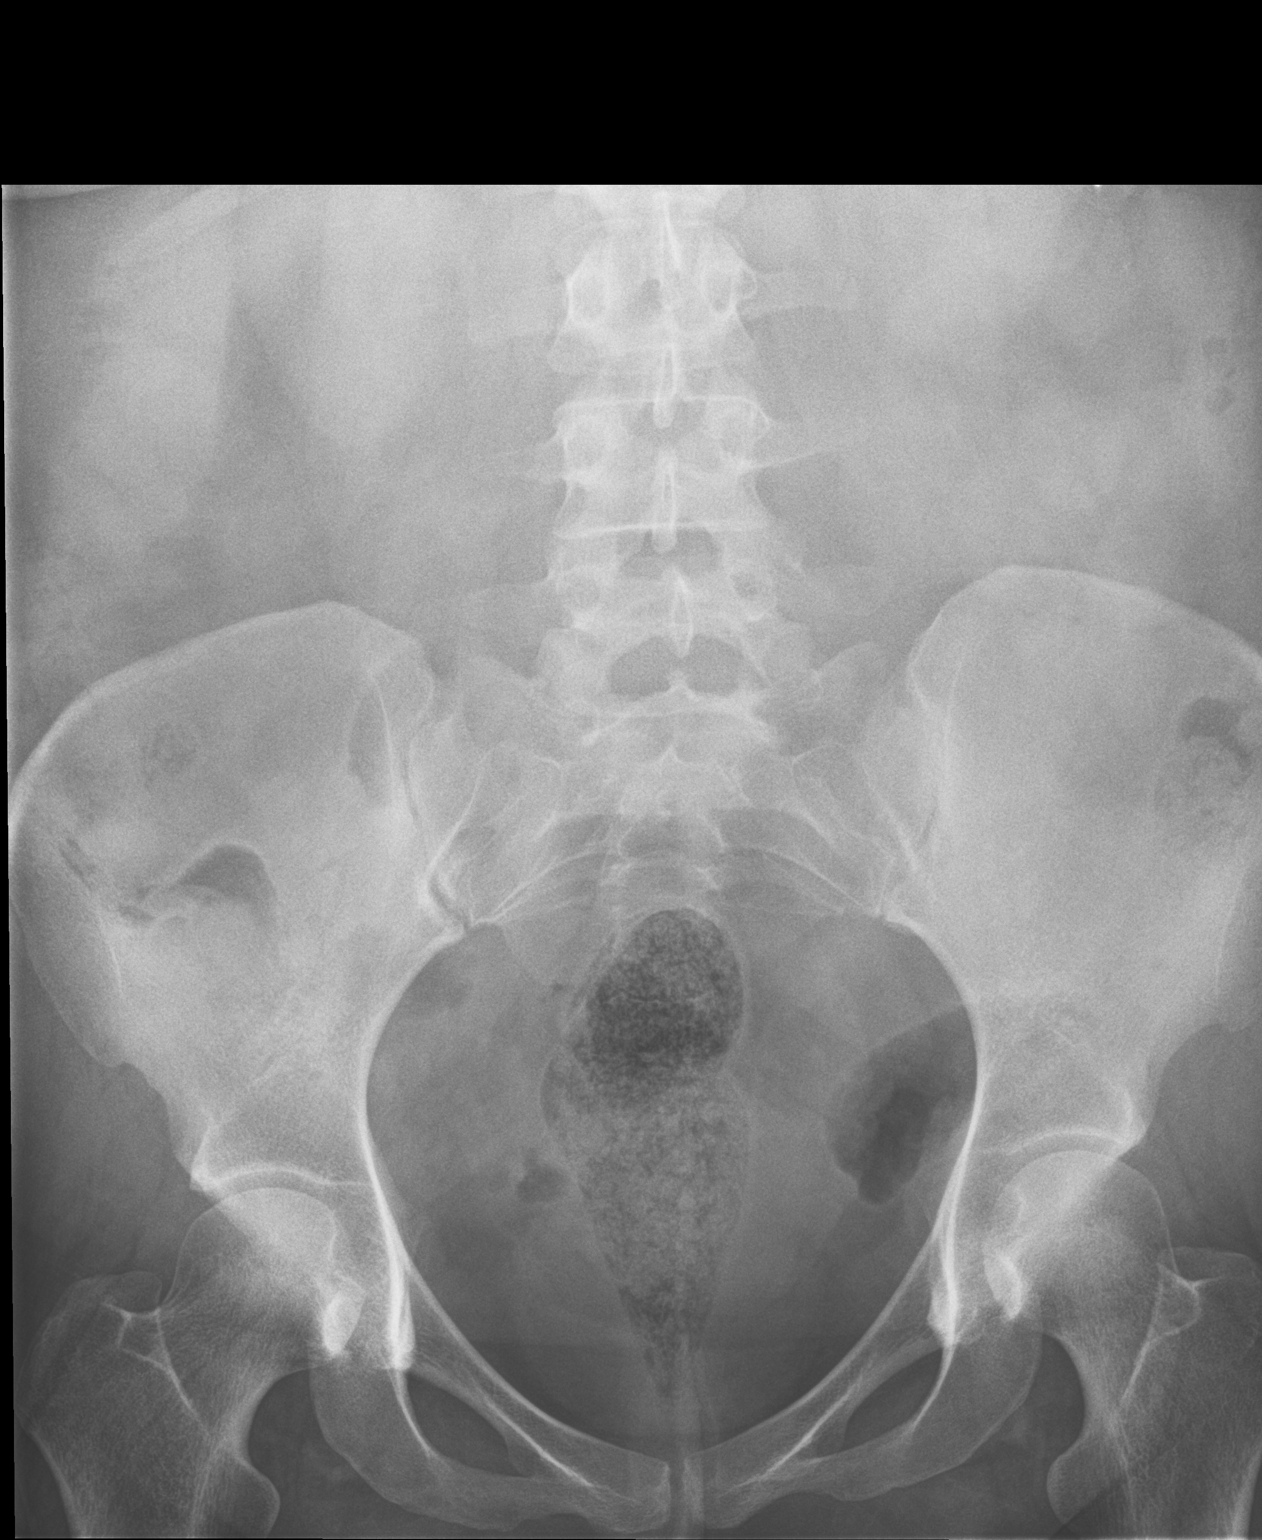

[abdomen supine (2 of 2)]
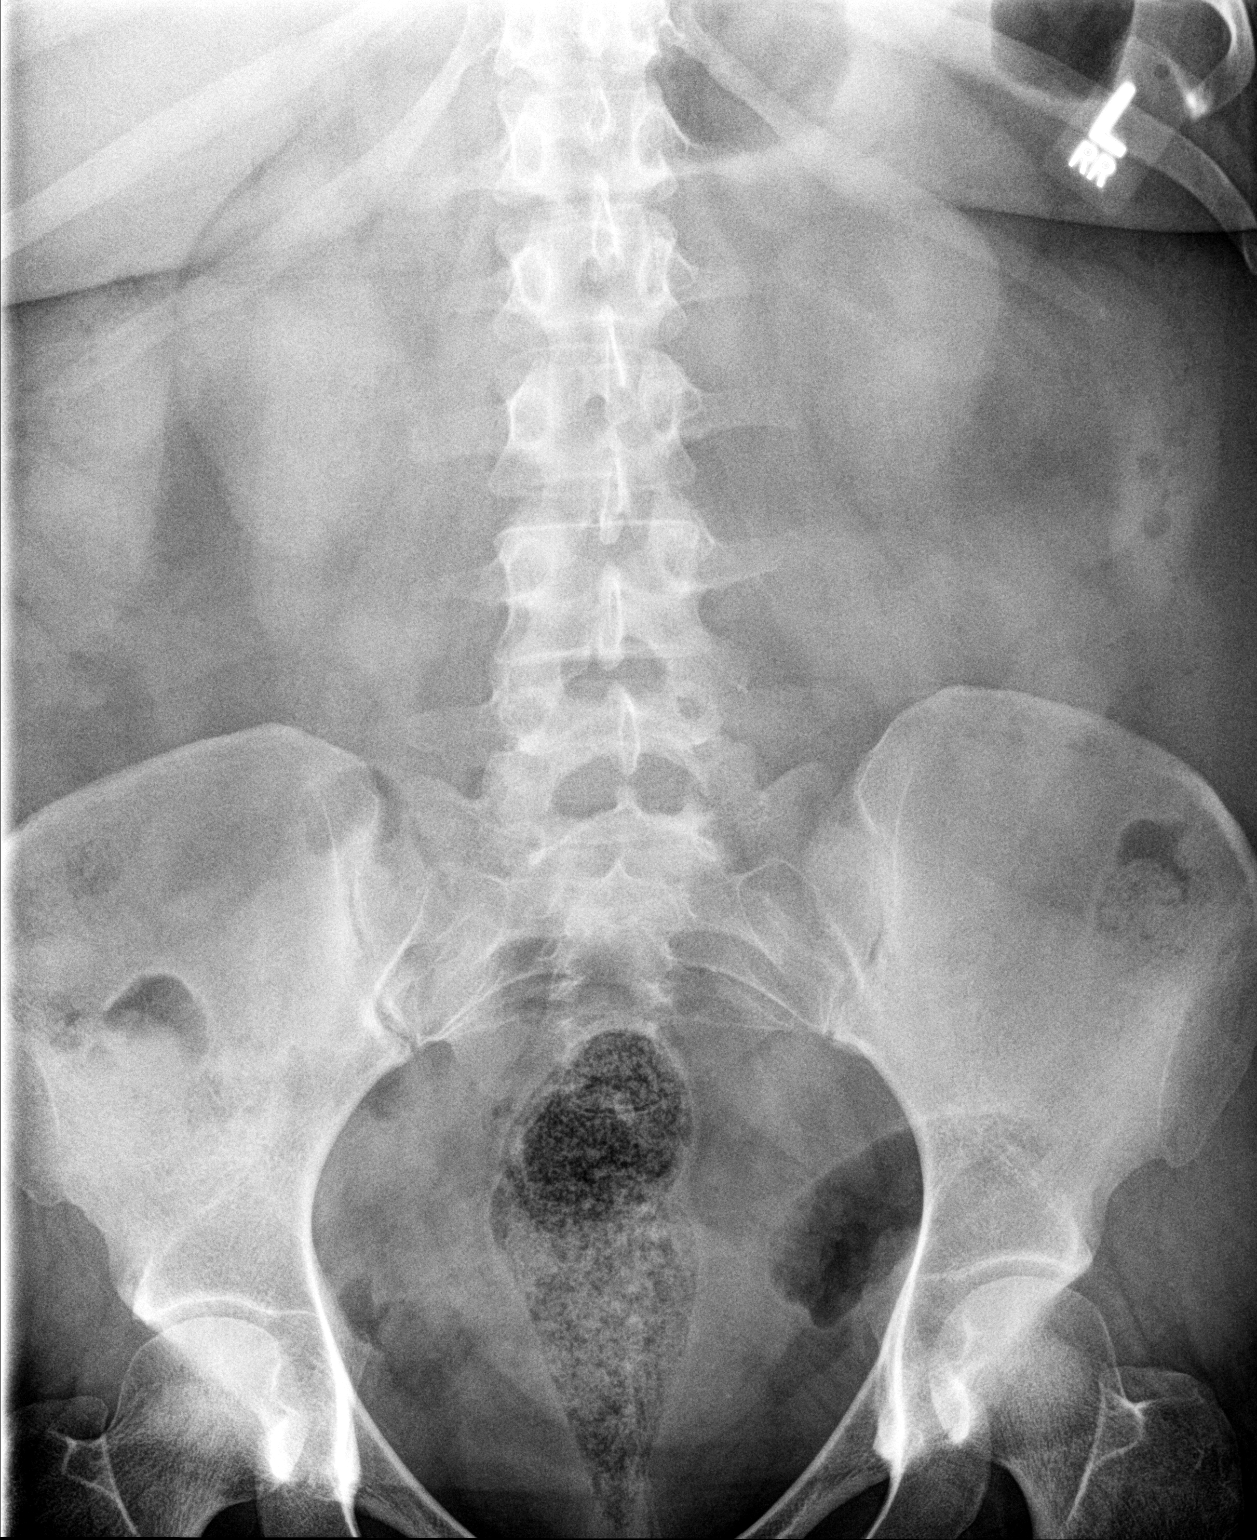

[3 of 3 positions shown; findings below may reference images not displayed]

FINDINGS: The bowel gas pattern is normal. There is no evidence of free air.
No radio-opaque calculi or other significant radiographic
abnormality is seen.
IMPRESSION: Negative.

## 2018-11-29 ENCOUNTER — Telehealth: Payer: Self-pay | Admitting: Family Medicine

## 2018-11-29 NOTE — Telephone Encounter (Signed)
Please advise - when prescreening patient for office visit tomorrow with Hoyle Sauer - pt answered "yes" to having been exposed to someone with a confirmed diagnosis of Covid, pt states about a month ago, pt is not currently having any symptoms, has not personally been diagnosed, has not been tested   Please advise

## 2018-11-29 NOTE — Telephone Encounter (Signed)
Called notified pt

## 2018-11-29 NOTE — Telephone Encounter (Signed)
Given that the exposure was over 14 days ago and she is not having any symptoms it is fine to allow her to come

## 2018-11-29 NOTE — Telephone Encounter (Signed)
Pt is coming in to office for DM check and A1C. Please advise. Thank you

## 2018-11-30 ENCOUNTER — Other Ambulatory Visit: Payer: Self-pay

## 2018-11-30 ENCOUNTER — Ambulatory Visit: Payer: 59 | Admitting: Nurse Practitioner

## 2018-11-30 ENCOUNTER — Encounter: Payer: Self-pay | Admitting: Nurse Practitioner

## 2018-11-30 VITALS — BP 130/82 | Temp 97.6°F | Ht 64.5 in | Wt 215.8 lb

## 2018-11-30 DIAGNOSIS — E119 Type 2 diabetes mellitus without complications: Secondary | ICD-10-CM | POA: Diagnosis not present

## 2018-11-30 LAB — POCT GLYCOSYLATED HEMOGLOBIN (HGB A1C): Hemoglobin A1C: 9.1 % — AB (ref 4.0–5.6)

## 2018-11-30 MED ORDER — METFORMIN HCL ER (MOD) 1000 MG PO TB24: ORAL_TABLET | ORAL | 2 refills | Status: DC

## 2018-11-30 MED ORDER — TRULICITY 1.5 MG/0.5ML ~~LOC~~ SOAJ: SUBCUTANEOUS | Status: DC

## 2018-11-30 NOTE — Progress Notes (Signed)
   Subjective:    Patient ID: Melanie Mccoy, female    DOB: [birth year], 28 y.o.   MRN: [id number]  Diabetes She presents for her follow-up diabetic visit. She has type 2 diabetes mellitus. There are no hypoglycemic associated symptoms. There are no diabetic associated symptoms. Pertinent negatives for diabetes include no chest pain, no polydipsia, no polyphagia and no polyuria. There are no hypoglycemic complications. There are no diabetic complications. She does not see a podiatrist.Eye exam is not current.   Pt states she will make an appointment with the eye doctor. Reports it being hard to eat healthy foods due to working two jobs. Does not engage in physical activity. Patient checks fasting sugars daily, normally 200s.    Review of Systems  Respiratory: Negative for cough, chest tightness and wheezing.   Cardiovascular: Negative for chest pain and palpitations.  Endocrine: Negative for polydipsia, polyphagia and polyuria.  Neurological: Negative for syncope, light-headedness and numbness.       Objective:   Physical Exam Cardiovascular:     Rate and Rhythm: Normal rate and regular rhythm.     Pulses: Normal pulses.     Heart sounds: Normal heart sounds.  Pulmonary:     Effort: Pulmonary effort is normal.     Breath sounds: Normal breath sounds. No wheezing.  Skin:    General: Skin is warm and dry.  Neurological:     Mental Status: She is oriented to person, place, and time.    Diabetic Foot Exam - Simple   Simple Foot Form Visual Inspection No deformities, no ulcerations, no other skin breakdown bilaterally: Yes Sensation Testing Intact to touch and monofilament testing bilaterally: Yes Pulse Check Posterior Tibialis and Dorsalis pulse intact bilaterally: Yes Comments    Results for orders placed or performed in visit on 11/30/18  POCT HgB A1C  Result Value Ref Range   Hemoglobin A1C 9.1 (A) 4.0 - 5.6 %   HbA1c POC (<> result, manual entry)     HbA1c, POC  (prediabetic range)     HbA1c, POC (controlled diabetic range)            Assessment & Plan:  1. Type 2 diabetes mellitus without complication, without long-term current use of insulin (HCC) - POCT HgB A1C Meds ordered this encounter  Medications  . metFORMIN (GLUMETZA) 1000 MG (MOD) 24 hr tablet    Sig: Take two tablets PO once daily    Dispense:  60 tablet    Refill:  2    Order Specific Question:   Supervising Provider    Answer:   Sallee Lange A [[id number]]   -Discussed dietary and lifestyle modications. Patient will try to incorporate some physical activity into her daily life. Pt trying to decrease simple carbohydrates including sugar. Try to make healthier choices even when eating out. Educated about avoid eating late to help decrease fasting sugars.Will switch to daily extended release Metformin to improve compliance.  Will increase dose of Trulicity to 3mg . Call if any adverse effects with dose increase. Provided education about taking omega-3 to help decrease triglyceride levels. Consider low-dose ace-inhibitor during next follow-up depending on urine microalbumin. Provided information about freestyle libre for blood sugars checks.    Return in about 3 months (around 03/01/2019) for Diabetes.

## 2018-11-30 NOTE — Patient Instructions (Addendum)
-  Check out freestyle [name] (see if insurance will cover) -Omega 3 Fish Oil for decrease triglycerides

## 2018-11-30 NOTE — Progress Notes (Signed)
   Subjective:    Patient ID: Melanie Mccoy, female    DOB: 1990-12-14, 28 y.o.   MRN: [id number]  Diabetes She presents for her follow-up diabetic visit. She has type 2 diabetes mellitus. There are no hypoglycemic associated symptoms. There are no diabetic associated symptoms. There are no hypoglycemic complications. There are no diabetic complications. She does not see a podiatrist.Eye exam is not current.    Results for orders placed or performed in visit on 11/30/18  POCT HgB A1C  Result Value Ref Range   Hemoglobin A1C 9.1 (A) 4.0 - 5.6 %   HbA1c POC (<> result, manual entry)     HbA1c, POC (prediabetic range)     HbA1c, POC (controlled diabetic range)      Review of Systems     Objective:   Physical Exam        Assessment & Plan:

## 2018-12-06 ENCOUNTER — Telehealth: Payer: Self-pay | Admitting: *Deleted

## 2018-12-06 ENCOUNTER — Telehealth: Payer: Self-pay | Admitting: Nurse Practitioner

## 2018-12-06 NOTE — Telephone Encounter (Signed)
Approval through cover my meds for trulicity. Pharm notified.

## 2018-12-06 NOTE — Telephone Encounter (Signed)
Form from pharmacy stating that generic medication of Metformin ER 1000 mg tablets are to be dispensed based on insurance plan pt has. Please advise. Thank you

## 2018-12-07 NOTE — Telephone Encounter (Signed)
Tanya, I am not sure what your note means. Insurance will not cover? Thanks.

## 2018-12-07 NOTE — Telephone Encounter (Signed)
Done

## 2018-12-07 NOTE — Telephone Encounter (Signed)
I have the form from pharmacy I will bring to you so you can look over it.

## 2019-01-02 ENCOUNTER — Telehealth: Payer: Self-pay | Admitting: Family Medicine

## 2019-01-02 ENCOUNTER — Encounter: Payer: Self-pay | Admitting: Nurse Practitioner

## 2019-01-02 NOTE — Telephone Encounter (Signed)
Patient states needs new prescription for metformin 1000 mg regular because her insurance will not pay for extended release that was prescribe her. She also needs test stripes,needles, Actu-Chek and [facility] [address]

## 2019-01-02 NOTE — Telephone Encounter (Signed)
Note sent through mychart

## 2019-01-04 ENCOUNTER — Other Ambulatory Visit: Payer: Self-pay | Admitting: Nurse Practitioner

## 2019-01-04 MED ORDER — METFORMIN HCL 500 MG PO TABS: 500.0000 mg | ORAL_TABLET | Freq: Two times a day (BID) | ORAL | 2 refills | Status: DC

## 2019-01-14 ENCOUNTER — Other Ambulatory Visit: Payer: Self-pay | Admitting: Nurse Practitioner

## 2019-01-14 MED ORDER — ACCU-CHEK SOFT TOUCH LANCETS MISC: 12 refills | Status: DC

## 2019-01-14 MED ORDER — GLUCOSE BLOOD VI STRP: ORAL_STRIP | 12 refills | Status: DC

## 2019-03-02 ENCOUNTER — Other Ambulatory Visit: Payer: Self-pay | Admitting: Nurse Practitioner

## 2019-03-07 ENCOUNTER — Telehealth: Payer: Self-pay | Admitting: Family Medicine

## 2019-03-07 DIAGNOSIS — E1169 Type 2 diabetes mellitus with other specified complication: Secondary | ICD-10-CM

## 2019-03-07 DIAGNOSIS — E119 Type 2 diabetes mellitus without complications: Secondary | ICD-10-CM

## 2019-03-07 DIAGNOSIS — R030 Elevated blood-pressure reading, without diagnosis of hypertension: Secondary | ICD-10-CM

## 2019-03-07 DIAGNOSIS — Z79899 Other long term (current) drug therapy: Secondary | ICD-10-CM

## 2019-03-07 DIAGNOSIS — E785 Hyperlipidemia, unspecified: Secondary | ICD-10-CM

## 2019-03-07 NOTE — Telephone Encounter (Signed)
Last labs 11/30/18: HgbA1c                 04/21/18: Lipid, Liver, Met 7, Urine micro

## 2019-03-07 NOTE — Telephone Encounter (Signed)
Repeat all above

## 2019-03-07 NOTE — Telephone Encounter (Signed)
Pt contacted and verbalized understanding. Lab orders placed 

## 2019-03-07 NOTE — Telephone Encounter (Signed)
Pt has appt on Monday and would like to know if she needs to have lab work done

## 2019-03-11 ENCOUNTER — Ambulatory Visit: Payer: 59 | Admitting: Family Medicine

## 2019-03-11 ENCOUNTER — Encounter: Payer: Self-pay | Admitting: Family Medicine

## 2019-03-11 ENCOUNTER — Other Ambulatory Visit: Payer: Self-pay

## 2019-03-11 VITALS — BP 118/88 | Temp 98.1°F | Ht 64.5 in | Wt 212.4 lb

## 2019-03-11 DIAGNOSIS — E785 Hyperlipidemia, unspecified: Secondary | ICD-10-CM

## 2019-03-11 DIAGNOSIS — Z79899 Other long term (current) drug therapy: Secondary | ICD-10-CM | POA: Diagnosis not present

## 2019-03-11 DIAGNOSIS — E1169 Type 2 diabetes mellitus with other specified complication: Secondary | ICD-10-CM | POA: Diagnosis not present

## 2019-03-11 DIAGNOSIS — E119 Type 2 diabetes mellitus without complications: Secondary | ICD-10-CM

## 2019-03-11 LAB — POCT GLYCOSYLATED HEMOGLOBIN (HGB A1C): Hemoglobin A1C: 8.2 % — AB (ref 4.0–5.6)

## 2019-03-11 MED ORDER — TRULICITY 3 MG/0.5ML ~~LOC~~ SOAJ: 3.0000 mg | SUBCUTANEOUS | 5 refills | Status: DC

## 2019-03-11 MED ORDER — GLIPIZIDE ER 10 MG PO TB24: ORAL_TABLET | ORAL | 5 refills | Status: DC

## 2019-03-11 MED ORDER — METFORMIN HCL 500 MG PO TABS: 500.0000 mg | ORAL_TABLET | Freq: Two times a day (BID) | ORAL | 5 refills | Status: DC

## 2019-03-11 NOTE — Progress Notes (Signed)
   Subjective:    Patient ID: Melanie Mccoy, female    DOB: 10/01/1990, 29 y.o.   MRN: [id number]  Diabetes She presents for her follow-up diabetic visit. She has type 2 diabetes mellitus. Current diabetic treatments: metformin, glipizide, trulicity. She is compliant with treatment all of the time. Exercise: tries to exercise. Home blood sugar record trend: high 100's. Eye exam is not current (2019).   Results for orders placed or performed in visit on 03/11/19  POCT HgB A1C  Result Value Ref Range   Hemoglobin A1C 8.2 (A) 4.0 - 5.6 %   HbA1c POC (<> result, manual entry)     HbA1c, POC (prediabetic range)     HbA1c, POC (controlled diabetic range)     Upper 100s ech morn on average   Usually once per week   Often on fridays    Grades diet overall decent    Watching better   Exercising by walking, some dancing,     Review of Systems No headache, no major weight loss or weight gain, no chest pain no back pain abdominal pain no change in bowel habits complete ROS otherwise negative     Objective:   Physical Exam Alert vitals stable, NAD. Blood pressure good on repeat. HEENT normal. Lungs clear. Heart regular rate and rhythm.        Assessment & Plan:  Impression type 2 diabetes.  Control good but not ideal.  Discussed.  Will increase glipizide XL to 10 mg each morning.  Hypoglycemia precautions discussed.  Other medications refilled.  Follow-up in 6 months

## 2019-03-12 LAB — BASIC METABOLIC PANEL WITH GFR
BUN/Creatinine Ratio: 17 (ref 9–23)
BUN: 13 mg/dL (ref 6–20)
CO2: 22 mmol/L (ref 20–29)
Calcium: 9.6 mg/dL (ref 8.7–10.2)
Chloride: 100 mmol/L (ref 96–106)
Creatinine, Ser: 0.77 mg/dL (ref 0.57–1.00)
GFR calc Af Amer: 122 mL/min/1.73
GFR calc non Af Amer: 106 mL/min/1.73
Glucose: 191 mg/dL — ABNORMAL HIGH (ref 65–99)
Potassium: 4.1 mmol/L (ref 3.5–5.2)
Sodium: 138 mmol/L (ref 134–144)

## 2019-03-12 LAB — HEPATIC FUNCTION PANEL
ALT: 24 IU/L (ref 0–32)
AST: 21 IU/L (ref 0–40)
Albumin: 4.1 g/dL (ref 3.9–5.0)
Alkaline Phosphatase: 78 IU/L (ref 39–117)
Bilirubin Total: 0.3 mg/dL (ref 0.0–1.2)
Bilirubin, Direct: 0.1 mg/dL (ref 0.00–0.40)
Total Protein: 6.5 g/dL (ref 6.0–8.5)

## 2019-03-12 LAB — MICROALBUMIN / CREATININE URINE RATIO
Creatinine, Urine: 312.1 mg/dL
Microalb/Creat Ratio: 38 mg/g creat — ABNORMAL HIGH (ref 0–29)
Microalbumin, Urine: 118.1 ug/mL

## 2019-03-12 LAB — LIPID PANEL
Chol/HDL Ratio: 4.5 ratio — ABNORMAL HIGH (ref 0.0–4.4)
Cholesterol, Total: 158 mg/dL (ref 100–199)
HDL: 35 mg/dL — ABNORMAL LOW
LDL Chol Calc (NIH): 50 mg/dL (ref 0–99)
Triglycerides: 494 mg/dL — ABNORMAL HIGH (ref 0–149)
VLDL Cholesterol Cal: 73 mg/dL — ABNORMAL HIGH (ref 5–40)

## 2019-03-17 ENCOUNTER — Encounter: Payer: Self-pay | Admitting: Family Medicine

## 2019-04-08 ENCOUNTER — Encounter: Payer: Self-pay | Admitting: Family Medicine

## 2019-07-04 ENCOUNTER — Other Ambulatory Visit: Payer: Self-pay

## 2019-07-05 ENCOUNTER — Telehealth: Payer: Self-pay | Admitting: Family Medicine

## 2019-07-05 ENCOUNTER — Telehealth (INDEPENDENT_AMBULATORY_CARE_PROVIDER_SITE_OTHER): Payer: 59 | Admitting: Family Medicine

## 2019-07-05 VITALS — Ht 64.0 in | Wt 213.0 lb

## 2019-07-05 DIAGNOSIS — U071 COVID-19: Secondary | ICD-10-CM | POA: Diagnosis not present

## 2019-07-05 NOTE — Telephone Encounter (Signed)
Ms. karthika, glasper are scheduled for a virtual visit with your provider today.    Just as we do with appointments in the office, we must obtain your consent to participate.  Your consent will be active for this visit and any virtual visit you may have with one of our providers in the next 365 days.    If you have a MyChart account, I can also send a copy of this consent to you electronically.  All virtual visits are billed to your insurance company just like a traditional visit in the office.  As this is a virtual visit, video technology does not allow for your provider to perform a traditional examination.  This may limit your provider's ability to fully assess your condition.  If your provider identifies any concerns that need to be evaluated in person or the need to arrange testing such as labs, EKG, etc, we will make arrangements to do so.    Although advances in technology are sophisticated, we cannot ensure that it will always work on either your end or our end.  If the connection with a video visit is poor, we may have to switch to a telephone visit.  With either a video or telephone visit, we are not always able to ensure that we have a secure connection.   I need to obtain your verbal consent now.   Are you willing to proceed with your visit today?   Dakiyah Heinke has provided verbal consent on 07/05/2019 for a virtual visit (video or telephone).   Marlowe Shores, LPN 5/40/0867  6:19 PM

## 2019-07-05 NOTE — Progress Notes (Signed)
   Subjective:    Patient ID: Melanie Mccoy, female    DOB: 02-Nov-1990, 29 y.o.   MRN: [id number] Positive Covid test through local pharmacy HPI Pt had positive COVID test yesterday. Pt is having sore throat, cough, chest pain, chills, diarrhea, sore/achey, fatigue. Pt was sore/achey on Sunday night into Monday but was not enough for patient to notice. Tuesday pt began to have diarrhea and feel aches  More. Pt took Mucinex last night. Pt has began to take Vit C, Zinc, Vit D.  Patient relates body aches cough congestion she does state that she gets out of breath when she moves around she denies swelling in the legs denies vomiting diarrhea.  Has underlying diabetes and obesity. Virtual Visit via Telephone Note  I connected with Melanie Mccoy on 07/05/19 at  3:00 PM EDT by telephone and verified that I am speaking with the correct person using two identifiers.  Location: Patient: home Provider: office   I discussed the limitations, risks, security and privacy concerns of performing an evaluation and management service by telephone and the availability of in person appointments. I also discussed with the patient that there may be a patient responsible charge related to this service. The patient expressed understanding and agreed to proceed.   History of Present Illness:    Observations/Objective:   Assessment and Plan:   Follow Up Instructions:    I discussed the assessment and treatment plan with the patient. The patient was provided an opportunity to ask questions and all were answered. The patient agreed with the plan and demonstrated an understanding of the instructions.   The patient was advised to call back or seek an in-person evaluation if the symptoms worsen or if the condition fails to improve as anticipated.  I provided 20 minutes of non-face-to-face time during this encounter.  Patient was seen with proper PPE     Review of Systems Body aches cough  congestion headache some nausea no vomiting some shortness of breath with activity    Objective:   Physical Exam  O2 sat 97% mucous membranes moist throat normal lungs clear respiratory rate normal heart regular no murmurs abdomen obese      Assessment & Plan:  Covid infection Covid infusion was discussed with the patient she would qualify due to obesity and diabetes.  Patient is interested but request more information. Due to religious practices she states if the infusion is lab created that is fine as long as it is not antibodies from plasma Infusion center was called message left  No sign of bacterial infection currently warning signs what to watch for discussed she does have a O2 sat meter if O2 sats drop below 90 go to ER

## 2019-07-06 ENCOUNTER — Telehealth: Payer: Self-pay | Admitting: Unknown Physician Specialty

## 2019-07-06 NOTE — Telephone Encounter (Signed)
Called to discuss with Melanie Mccoy about Covid symptoms and the use of bamlanivimab, a monoclonal antibody infusion for those with mild to moderate Covid symptoms and at a high risk of hospitalization.     Pt is qualified for this infusion at the [facility] infusion center due to co-morbid conditions and/or a member of an at-risk group, however declines infusion at this time. Symptoms tier reviewed as well as criteria for ending isolation.  Symptoms reviewed that would warrant ED/Hospital evaluation. Preventative practices reviewed. Patient verbalized understanding. Patient advised to call back if he decides that he does want to get infusion. Callback number to the infusion center given. Patient advised to go to Urgent care or ED with severe symptoms. Last date she would be eligible for infusion is 5/5.    Patient Active Problem List   Diagnosis Date Noted  . Hyperlipidemia associated with type 2 diabetes mellitus (HCC) 02/04/2017  . Type 2 diabetes mellitus without complication, without long-term current use of insulin (HCC) 01/19/2016  . Acne vulgaris 04/15/2014  . Morbid obesity (HCC) 04/15/2014  . PCOS (polycystic ovarian syndrome) 04/15/2014  . Status post primary low transverse cesarean section 11/22/2012

## 2019-07-09 ENCOUNTER — Telehealth: Payer: Self-pay | Admitting: *Deleted

## 2019-07-09 ENCOUNTER — Inpatient Hospital Stay (HOSPITAL_COMMUNITY)
Admission: EM | Admit: 2019-07-09 | Discharge: 2019-07-10 | DRG: 177 | Disposition: A | Discharge status: Home / Self Care | Payer: 59 | Attending: Internal Medicine | Admitting: Internal Medicine

## 2019-07-09 ENCOUNTER — Encounter (HOSPITAL_COMMUNITY): Payer: Self-pay | Admitting: Emergency Medicine

## 2019-07-09 ENCOUNTER — Other Ambulatory Visit: Payer: Self-pay

## 2019-07-09 ENCOUNTER — Emergency Department (HOSPITAL_COMMUNITY): Payer: 59

## 2019-07-09 DIAGNOSIS — E282 Polycystic ovarian syndrome: Secondary | ICD-10-CM | POA: Diagnosis present

## 2019-07-09 DIAGNOSIS — R739 Hyperglycemia, unspecified: Secondary | ICD-10-CM | POA: Diagnosis not present

## 2019-07-09 DIAGNOSIS — E119 Type 2 diabetes mellitus without complications: Secondary | ICD-10-CM

## 2019-07-09 DIAGNOSIS — U071 COVID-19: Principal | ICD-10-CM | POA: Diagnosis present

## 2019-07-09 DIAGNOSIS — J1282 Pneumonia due to coronavirus disease 2019: Secondary | ICD-10-CM | POA: Diagnosis present

## 2019-07-09 DIAGNOSIS — J9601 Acute respiratory failure with hypoxia: Secondary | ICD-10-CM | POA: Diagnosis present

## 2019-07-09 DIAGNOSIS — Z7984 Long term (current) use of oral hypoglycemic drugs: Secondary | ICD-10-CM | POA: Diagnosis not present

## 2019-07-09 DIAGNOSIS — Z833 Family history of diabetes mellitus: Secondary | ICD-10-CM | POA: Diagnosis not present

## 2019-07-09 DIAGNOSIS — E1165 Type 2 diabetes mellitus with hyperglycemia: Secondary | ICD-10-CM | POA: Diagnosis present

## 2019-07-09 DIAGNOSIS — E6609 Other obesity due to excess calories: Secondary | ICD-10-CM

## 2019-07-09 DIAGNOSIS — Z886 Allergy status to analgesic agent status: Secondary | ICD-10-CM

## 2019-07-09 DIAGNOSIS — Z91030 Bee allergy status: Secondary | ICD-10-CM | POA: Diagnosis not present

## 2019-07-09 DIAGNOSIS — E871 Hypo-osmolality and hyponatremia: Secondary | ICD-10-CM | POA: Diagnosis present

## 2019-07-09 DIAGNOSIS — Z9981 Dependence on supplemental oxygen: Secondary | ICD-10-CM | POA: Diagnosis not present

## 2019-07-09 DIAGNOSIS — K219 Gastro-esophageal reflux disease without esophagitis: Secondary | ICD-10-CM | POA: Diagnosis present

## 2019-07-09 DIAGNOSIS — E785 Hyperlipidemia, unspecified: Secondary | ICD-10-CM | POA: Diagnosis present

## 2019-07-09 DIAGNOSIS — E860 Dehydration: Secondary | ICD-10-CM | POA: Diagnosis present

## 2019-07-09 DIAGNOSIS — E1169 Type 2 diabetes mellitus with other specified complication: Secondary | ICD-10-CM | POA: Diagnosis present

## 2019-07-09 DIAGNOSIS — Z6836 Body mass index (BMI) 36.0-36.9, adult: Secondary | ICD-10-CM | POA: Diagnosis not present

## 2019-07-09 DIAGNOSIS — D649 Anemia, unspecified: Secondary | ICD-10-CM | POA: Diagnosis present

## 2019-07-09 DIAGNOSIS — Z9109 Other allergy status, other than to drugs and biological substances: Secondary | ICD-10-CM

## 2019-07-09 DIAGNOSIS — Z6835 Body mass index (BMI) 35.0-35.9, adult: Secondary | ICD-10-CM

## 2019-07-09 LAB — CBC
HCT: 47.7 % — ABNORMAL HIGH (ref 36.0–46.0)
Hemoglobin: 15.9 g/dL — ABNORMAL HIGH (ref 12.0–15.0)
MCH: 29.8 pg (ref 26.0–34.0)
MCHC: 33.3 g/dL (ref 30.0–36.0)
MCV: 89.3 fL (ref 80.0–100.0)
Platelets: 176 10*3/uL (ref 150–400)
RBC: 5.34 MIL/uL — ABNORMAL HIGH (ref 3.87–5.11)
RDW: 12.8 % (ref 11.5–15.5)
WBC: 6.9 10*3/uL (ref 4.0–10.5)
nRBC: 0 % (ref 0.0–0.2)

## 2019-07-09 LAB — HIV ANTIBODY (ROUTINE TESTING W REFLEX): HIV Screen 4th Generation wRfx: NONREACTIVE

## 2019-07-09 LAB — BASIC METABOLIC PANEL
Anion gap: 15 (ref 5–15)
BUN: 10 mg/dL (ref 6–20)
CO2: 23 mmol/L (ref 22–32)
Calcium: 8.7 mg/dL — ABNORMAL LOW (ref 8.9–10.3)
Chloride: 94 mmol/L — ABNORMAL LOW (ref 98–111)
Creatinine, Ser: 0.85 mg/dL (ref 0.44–1.00)
GFR calc Af Amer: >60 mL/min (ref >60)
GFR calc non Af Amer: >60 mL/min (ref >60)
Glucose, Bld: 299 mg/dL — ABNORMAL HIGH (ref 70–99)
Potassium: 3.7 mmol/L (ref 3.5–5.1)
Sodium: 132 mmol/L — ABNORMAL LOW (ref 135–145)

## 2019-07-09 LAB — FERRITIN: Ferritin: 174 ng/mL (ref 11–307)

## 2019-07-09 LAB — VITAMIN D 25 HYDROXY (VIT D DEFICIENCY, FRACTURES): Vit D, 25-Hydroxy: 24.61 ng/mL — ABNORMAL LOW (ref 30–100)

## 2019-07-09 LAB — CBG MONITORING, ED: Glucose-Capillary: 220 mg/dL — ABNORMAL HIGH (ref 70–99)

## 2019-07-09 LAB — TRIGLYCERIDES: Triglycerides: 234 mg/dL — ABNORMAL HIGH (ref <150)

## 2019-07-09 LAB — PROCALCITONIN: Procalcitonin: <0.1 ng/mL

## 2019-07-09 LAB — FIBRINOGEN: Fibrinogen: 774 mg/dL — ABNORMAL HIGH (ref 210–475)

## 2019-07-09 LAB — RESPIRATORY PANEL BY RT PCR (FLU A&B, COVID)
Influenza A by PCR: NEGATIVE
Influenza B by PCR: NEGATIVE
SARS Coronavirus 2 by RT PCR: POSITIVE — AB

## 2019-07-09 LAB — HEMOGLOBIN A1C
Hgb A1c MFr Bld: 11 % — ABNORMAL HIGH (ref 4.8–5.6)
Mean Plasma Glucose: 269 mg/dL

## 2019-07-09 LAB — C-REACTIVE PROTEIN: CRP: 29.3 mg/dL — ABNORMAL HIGH (ref <1.0)

## 2019-07-09 LAB — LACTATE DEHYDROGENASE: LDH: 246 U/L — ABNORMAL HIGH (ref 98–192)

## 2019-07-09 LAB — ABO/RH: ABO/RH(D): A POS

## 2019-07-09 LAB — GLUCOSE, CAPILLARY: Glucose-Capillary: 380 mg/dL — ABNORMAL HIGH (ref 70–99)

## 2019-07-09 LAB — D-DIMER, QUANTITATIVE: D-Dimer, Quant: 0.43 ug/mL-FEU (ref 0.00–0.50)

## 2019-07-09 MED ORDER — SODIUM CHLORIDE 0.9 % IV SOLN: INTRAVENOUS | Status: DC

## 2019-07-09 MED ORDER — INSULIN ASPART 100 UNIT/ML ~~LOC~~ SOLN
8.0000 [IU] | Freq: Once | SUBCUTANEOUS | Status: AC
  Administered 2019-07-09: 8 [IU] via SUBCUTANEOUS
  Filled 2019-07-09: qty 1

## 2019-07-09 MED ORDER — SODIUM CHLORIDE 0.9 % IV SOLN
100.0000 mg | Freq: Every day | INTRAVENOUS | Status: DC
  Administered 2019-07-10: 11:00:00 100 mg via INTRAVENOUS
  Filled 2019-07-09 (×2): qty 20

## 2019-07-09 MED ORDER — GUAIFENESIN-DM 100-10 MG/5ML PO SYRP: 10.0000 mL | ORAL_SOLUTION | ORAL | Status: DC | PRN

## 2019-07-09 MED ORDER — ONDANSETRON HCL 4 MG PO TABS: 4.0000 mg | ORAL_TABLET | Freq: Four times a day (QID) | ORAL | Status: DC | PRN

## 2019-07-09 MED ORDER — ENOXAPARIN SODIUM 60 MG/0.6ML ~~LOC~~ SOLN
50.0000 mg | SUBCUTANEOUS | Status: DC
  Administered 2019-07-09: 50 mg via SUBCUTANEOUS
  Filled 2019-07-09: qty 0.6

## 2019-07-09 MED ORDER — INSULIN DETEMIR 100 UNIT/ML ~~LOC~~ SOLN
12.0000 [IU] | Freq: Two times a day (BID) | SUBCUTANEOUS | Status: DC
  Administered 2019-07-09 – 2019-07-10 (×2): 12 [IU] via SUBCUTANEOUS
  Filled 2019-07-09 (×4): qty 0.12

## 2019-07-09 MED ORDER — ONDANSETRON HCL 4 MG/2ML IJ SOLN: 4.0000 mg | Freq: Four times a day (QID) | INTRAMUSCULAR | Status: DC | PRN

## 2019-07-09 MED ORDER — INSULIN DETEMIR 100 UNIT/ML ~~LOC~~ SOLN: 15.0000 [IU] | Freq: Every day | SUBCUTANEOUS | Status: DC

## 2019-07-09 MED ORDER — ASCORBIC ACID 500 MG PO TABS
500.0000 mg | ORAL_TABLET | Freq: Every day | ORAL | Status: DC
  Administered 2019-07-09 – 2019-07-10 (×2): 500 mg via ORAL
  Filled 2019-07-09 (×2): qty 1

## 2019-07-09 MED ORDER — SODIUM CHLORIDE 0.9 % IV BOLUS
500.0000 mL | Freq: Once | INTRAVENOUS | Status: AC
  Administered 2019-07-09: 500 mL via INTRAVENOUS

## 2019-07-09 MED ORDER — IPRATROPIUM-ALBUTEROL 20-100 MCG/ACT IN AERS: 1.0000 | INHALATION_SPRAY | Freq: Four times a day (QID) | RESPIRATORY_TRACT | Status: DC | PRN

## 2019-07-09 MED ORDER — METOPROLOL TARTRATE 5 MG/5ML IV SOLN
5.0000 mg | INTRAVENOUS | Status: AC | PRN
  Administered 2019-07-09 – 2019-07-10 (×2): 5 mg via INTRAVENOUS
  Filled 2019-07-09 (×2): qty 5

## 2019-07-09 MED ORDER — SODIUM CHLORIDE 0.9% FLUSH
3.0000 mL | Freq: Two times a day (BID) | INTRAVENOUS | Status: DC
  Administered 2019-07-09 – 2019-07-10 (×3): 3 mL via INTRAVENOUS

## 2019-07-09 MED ORDER — IPRATROPIUM-ALBUTEROL 20-100 MCG/ACT IN AERS
1.0000 | INHALATION_SPRAY | Freq: Four times a day (QID) | RESPIRATORY_TRACT | Status: DC
  Administered 2019-07-09 (×2): 1 via RESPIRATORY_TRACT
  Filled 2019-07-09: qty 4

## 2019-07-09 MED ORDER — ZINC SULFATE 220 (50 ZN) MG PO CAPS
220.0000 mg | ORAL_CAPSULE | Freq: Every day | ORAL | Status: DC
  Administered 2019-07-09 – 2019-07-10 (×2): 220 mg via ORAL
  Filled 2019-07-09 (×2): qty 1

## 2019-07-09 MED ORDER — INSULIN ASPART 100 UNIT/ML ~~LOC~~ SOLN
0.0000 [IU] | Freq: Three times a day (TID) | SUBCUTANEOUS | Status: DC
  Administered 2019-07-09: 7 [IU] via SUBCUTANEOUS
  Administered 2019-07-10: 15 [IU] via SUBCUTANEOUS
  Administered 2019-07-10: 20 [IU] via SUBCUTANEOUS

## 2019-07-09 MED ORDER — METHYLPREDNISOLONE SODIUM SUCC 125 MG IJ SOLR
60.0000 mg | Freq: Two times a day (BID) | INTRAMUSCULAR | Status: DC
  Administered 2019-07-09 – 2019-07-10 (×2): 60 mg via INTRAVENOUS
  Filled 2019-07-09 (×2): qty 2

## 2019-07-09 MED ORDER — INSULIN ASPART 100 UNIT/ML ~~LOC~~ SOLN
0.0000 [IU] | Freq: Every day | SUBCUTANEOUS | Status: DC
  Administered 2019-07-09: 22:00:00 5 [IU] via SUBCUTANEOUS

## 2019-07-09 MED ORDER — ACETAMINOPHEN 325 MG PO TABS
650.0000 mg | ORAL_TABLET | Freq: Four times a day (QID) | ORAL | Status: DC | PRN
  Administered 2019-07-09: 650 mg via ORAL
  Filled 2019-07-09: qty 2

## 2019-07-09 MED ORDER — SODIUM CHLORIDE 0.9 % IV SOLN
200.0000 mg | Freq: Once | INTRAVENOUS | Status: AC
  Administered 2019-07-09: 200 mg via INTRAVENOUS
  Filled 2019-07-09: qty 40

## 2019-07-09 MED ORDER — IPRATROPIUM-ALBUTEROL 20-100 MCG/ACT IN AERS
1.0000 | INHALATION_SPRAY | Freq: Two times a day (BID) | RESPIRATORY_TRACT | Status: DC
  Administered 2019-07-10: 1 via RESPIRATORY_TRACT

## 2019-07-09 MED ORDER — HYDROCOD POLST-CPM POLST ER 10-8 MG/5ML PO SUER: 5.0000 mL | Freq: Two times a day (BID) | ORAL | Status: DC | PRN

## 2019-07-09 NOTE — ED Triage Notes (Signed)
PT states she had symptoms of dry cough, generalized fatigue, decreased appetitive and occasional loose stools with no fever x8 days. PT states she test positive for Covid on 07/05/19. PT states O2 sats were 87-89% at home at times and was told by Dr. Fletcher Anon office to come to the ED for eval today.

## 2019-07-09 NOTE — Progress Notes (Signed)
Pharmacy Note - Remdesivir Dosing  O:  ALT:     CXR:  Suggestion of subtle bilateral nodular and interstitial airspace opacities suspicious for atypical/viral pneumonia   Requiring supplemental O2:  91% on RA   A/P:  Patient meets criteria for remdesivir.  Begin remdesivir 200 mg IV x 1, followed by 100 mg IV daily x 4 days  Monitor ALT, clinical progress  Tama High, Pharm. D. Clinical Pharmacist 07/09/2019 3:43 PM

## 2019-07-09 NOTE — ED Provider Notes (Addendum)
[facility] EMERGENCY DEPARTMENT Provider Note   CSN: [id number] Arrival date & time: 07/09/19  1016     History Chief Complaint  Patient presents with  . Covid Exposure    Melanie Mccoy is a 29 y.o. female.  Patient presents with covid. Indicates had a positive test on 4/30 at symptoms began a few days prior to that. Notes low grade fever, non prod cough, and body aches. Symptoms acute onset, moderate, persistent, felt somewhat worse in past 1-2 days. Pcp/infusion center had contacted re qualified for ab infusion however pt has declined. Today noted pulse ox 88% on home monitor and so came to ED. No chest pain or discomfort. No hemoptysis. No vomiting, had a few loose stools but no severe diarrhea. No abd pain.  No leg pain or swelling. No syncope. Non smoker. No hx chronic lung disease or asthma.   The history is provided by the patient.       Past Medical History:  Diagnosis Date  . Anemia    slightly anemic, iron supplements every 2-3 days  . Bartholin cyst   . Chlamydia 01/2011  . Diabetes mellitus without complication (HCC)    gestational diagnosis 2nd trimester  . Family history of anesthesia complication    grandmother had spinal with nerve damage  . No pertinent past medical history     Patient Active Problem List   Diagnosis Date Noted  . Hyperlipidemia associated with type 2 diabetes mellitus (HCC) 02/04/2017  . Type 2 diabetes mellitus without complication, without long-term current use of insulin (HCC) 01/19/2016  . Acne vulgaris 04/15/2014  . Morbid obesity (HCC) 04/15/2014  . PCOS (polycystic ovarian syndrome) 04/15/2014  . Status post primary low transverse cesarean section 11/22/2012    Past Surgical History:  Procedure Laterality Date  . CESAREAN SECTION N/A 11/22/2012   Procedure: PRIMARY CESAREAN SECTION;  Surgeon: Esmeralda Arthur, MD;  Location: [facility];  Service: Obstetrics;  Laterality: N/A;  . CYST REMOVAL TRUNK  2005     OB History    Gravida  2   Para  2   Term      Preterm      AB      Living  1     SAB      TAB      Ectopic      Multiple      Live Births  1           Family History  Problem Relation Age of Onset  . Cancer Other   . Heart disease Other   . Diabetes Other   . Diabetes Mother   . Diabetes Father   . Diabetes Maternal Grandfather     Social History   Tobacco Use  . Smoking status: Never Smoker  . Smokeless tobacco: Never Used  Substance Use Topics  . Alcohol use: No    Alcohol/week: 0.0 standard drinks  . Drug use: No    Home Medications Prior to Admission medications   Medication Sig Start Date End Date Taking? Authorizing Provider  Dulaglutide (TRULICITY) 3 MG/0.5ML SOPN Inject 3 mg into the skin once a week. 03/11/19   Merlyn Albert, MD  glipiZIDE (GLIPIZIDE XL) 10 MG 24 hr tablet Take one tablet by mouth every morning 03/11/19   Merlyn Albert, MD  glucose blood test strip Use as instructed 01/14/19   Campbell Riches, NP  Lancets (ACCU-CHEK SOFT TOUCH) lancets Use as instructed 01/14/19  Campbell Riches, NP  metFORMIN (GLUCOPHAGE) 500 MG tablet Take 1 tablet (500 mg total) by mouth 2 (two) times daily with a meal. 03/11/19   Merlyn Albert, MD    Allergies    Bee venom, Ibuprofen, and Nickel  Review of Systems   Review of Systems  Constitutional: Positive for fever.  HENT: Negative for sore throat.   Eyes: Negative for redness.  Respiratory: Positive for cough.        Dyspnea w cough and exertion.   Cardiovascular: Negative for chest pain and leg swelling.  Gastrointestinal: Negative for abdominal pain and vomiting.  Genitourinary: Negative for flank pain.  Musculoskeletal: Positive for myalgias. Negative for neck pain and neck stiffness.  Skin: Negative for rash.  Neurological: Negative for headaches.  Hematological: Does not bruise/bleed easily.  Psychiatric/Behavioral: Negative for confusion.    Physical Exam Updated Vital Signs BP (!)  129/99 (BP Location: Right Arm)   Pulse (!) 130   Temp 99.9 F (37.7 C) (Oral)   Resp (!) 24   Ht 1.626 m (5\' 4" )   Wt 96.6 kg   SpO2 91%   BMI 36.56 kg/m   Physical Exam Vitals and nursing note reviewed.  Constitutional:      Appearance: Normal appearance. She is well-developed.  HENT:     Head: Atraumatic.     Nose: Nose normal.     Mouth/Throat:     Mouth: Mucous membranes are moist.  Eyes:     General: No scleral icterus.    Conjunctiva/sclera: Conjunctivae normal.  Neck:     Trachea: No tracheal deviation.  Cardiovascular:     Rate and Rhythm: Regular rhythm. Tachycardia present.     Pulses: Normal pulses.     Heart sounds: Normal heart sounds. No murmur. No friction rub. No gallop.   Pulmonary:     Effort: Pulmonary effort is normal. No respiratory distress.     Breath sounds: Normal breath sounds.  Abdominal:     General: Bowel sounds are normal. There is no distension.     Palpations: Abdomen is soft. There is no mass.     Tenderness: There is no abdominal tenderness. There is no guarding or rebound.     Hernia: No hernia is present.     Comments: Obese.   Genitourinary:    Comments: No cva tenderness.  Musculoskeletal:        General: No swelling or tenderness.     Cervical back: Normal range of motion and neck supple. No rigidity. No muscular tenderness.     Right lower leg: No edema.     Left lower leg: No edema.  Skin:    General: Skin is warm and dry.     Findings: No rash.  Neurological:     Mental Status: She is alert.     Comments: Alert, speech normal.   Psychiatric:        Mood and Affect: Mood normal.     ED Results / Procedures / Treatments   Labs (all labs ordered are listed, but only abnormal results are displayed) Results for orders placed or performed during the hospital encounter of 07/09/19  CBC  Result Value Ref Range   WBC 6.9 4.0 - 10.5 K/uL   RBC 5.34 (H) 3.87 - 5.11 MIL/uL   Hemoglobin 15.9 (H) 12.0 - 15.0 g/dL   HCT 09/08/19  (H) 60.6 - 46.0 %   MCV 89.3 80.0 - 100.0 fL   MCH 29.8 26.0 - 34.0  pg   MCHC 33.3 30.0 - 36.0 g/dL   RDW 12.8 11.5 - 15.5 %   Platelets 176 150 - 400 K/uL   nRBC 0.0 0.0 - 0.2 %  Basic metabolic panel  Result Value Ref Range   Sodium 132 (L) 135 - 145 mmol/L   Potassium 3.7 3.5 - 5.1 mmol/L   Chloride 94 (L) 98 - 111 mmol/L   CO2 23 22 - 32 mmol/L   Glucose, Bld 299 (H) 70 - 99 mg/dL   BUN 10 6 - 20 mg/dL   Creatinine, Ser 0.85 0.44 - 1.00 mg/dL   Calcium 8.7 (L) 8.9 - 10.3 mg/dL   GFR calc non Af Amer >60 >60 mL/min   GFR calc Af Amer >60 >60 mL/min   Anion gap 15 5 - 15  D-dimer, quantitative  Result Value Ref Range   D-Dimer, Quant 0.43 0.00 - 0.50 ug/mL-FEU  Lactate dehydrogenase  Result Value Ref Range   LDH 246 (H) 98 - 192 U/L  Fibrinogen  Result Value Ref Range   Fibrinogen 774 (H) 210 - 475 mg/dL    EKG EKG Interpretation  Date/Time:  Tuesday Jul 09 2019 11:33:05 EDT Ventricular Rate:  131 PR Interval:    QRS Duration: 82 QT Interval:  287 QTC Calculation: 424 R Axis:   10 Text Interpretation: Sinus tachycardia Low voltage, precordial leads Nonspecific T wave abnormality No previous tracing Confirmed by Lajean Saver [phone]) on 07/09/2019 3:09:34 PM   Radiology Infiltrates c/w covid pna    Procedures Procedures (including critical care time)  Medications Ordered in ED Medications - No data to display  ED Course  I have reviewed the triage vital signs and the nursing notes.  Pertinent labs & imaging results that were available during my care of the patient were reviewed by me and considered in my medical decision making (see chart for details).    MDM Rules/Calculators/A&P                     Labs. Cxr.   Reviewed nursing notes and prior charts for additional history.  Recent pcp/infusion center notes offering ab therapy - pt has decline.   MDM Number of Diagnoses or Management Options Acute respiratory failure with hypoxia ([place]): new,  needed workup COVID-19 virus infection: new, needed workup Pneumonia due to COVID-19 virus: new, needed workup   Amount and/or Complexity of Data Reviewed Clinical lab tests: ordered and reviewed Tests in the radiology section of CPT: ordered and reviewed Tests in the medicine section of CPT: ordered and reviewed Decide to obtain previous medical records or to obtain history from someone other than the patient: yes Obtain history from someone other than the patient: yes Review and summarize past medical records: yes Discuss the patient with other providers: yes Independent visualization of images, tracings, or specimens: yes  Risk of Complications, Morbidity, and/or Mortality Presenting problems: high Diagnostic procedures: high Management options: high   Discussed Ab infusion in ED, pt continues to decline that therapy.   Pt has nail polish/painted nails - when pulse ox is picking up with good waveform, pulse ox generally in 86- 90% range, although gets up to 95% with depe breathing.   Labs reviewed/interpreted by me - glucose elevated 299. hco3 normal. novolog sq. Po fluids. Iv ns bolus.   Incentive spirometer provided - discussed use.   CXR reviewed/interpreted by me - c/w covid pna bil.  Offered ab infusion again, pt declines.   Netta Cedars  Luellen Pucker was evaluated in Emergency Department on 07/09/2019 for the symptoms described in the history of present illness. She was evaluated in the context of the global COVID-19 pandemic, which necessitated consideration that the patient might be at risk for infection with the SARS-CoV-2 virus that causes COVID-19. Institutional protocols and algorithms that pertain to the evaluation of patients at risk for COVID-19 are in a state of rapid change based on information released by regulatory bodies including the CDC and federal and state organizations. These policies and algorithms were followed during the patient's care in the ED.  On recheck  ,remains tachycardic and tachypneic, and room air pulse ox in 86- 88% range, at rest. Will admit. Hospitalist consulted for admission.   Pt to receive iv steroids, iv remdesivir.   CRITICAL CARE RE: covid infection with bilateral covid pna, acute respiratory failure w hypoxia, hyperglycemia, marked elevation inflammatory markers Performed by: Suzi Roots Total critical care time: 40 minutes Critical care time was exclusive of separately billable procedures and treating other patients. Critical care was necessary to treat or prevent imminent or life-threatening deterioration. Critical care was time spent personally by me on the following activities: development of treatment plan with patient and/or surrogate as well as nursing, discussions with consultants, evaluation of patient's response to treatment, examination of patient, obtaining history from patient or surrogate, ordering and performing treatments and interventions, ordering and review of laboratory studies, ordering and review of radiographic studies, pulse oximetry and re-evaluation of patient's condition.      Final Clinical Impression(s) / ED Diagnoses Final diagnoses:  None    Rx / DC Orders ED Discharge Orders    None           Cathren Laine, MD 07/09/19 1510

## 2019-07-09 NOTE — Plan of Care (Signed)

## 2019-07-09 NOTE — H&P (Addendum)
History and Physical    Melanie Mccoy PYP:[id number] DOB: 17-Nov-1990 DOA: 07/09/2019  PCP: Merlyn Albert, MD  Patient coming from: Home  I have personally briefly reviewed patient's old medical records in [facility]  Chief Complaint: Shortness of breath, hypoxia, chills, nonproductive coughing spells and general malaise.  HPI: Melanie Mccoy is a 29 y.o. female with medical history significant of type 2 diabetes mellitus, HLD, morbid obesity and gastroesophageal reflux disease; who presented to the hospital secondary to general malaise, shortness of breath, chills and nonproductive cough.  Symptoms have been present for the last 4-5 days.  Patient was diagnosed with Covid as an outpatient and giving her comorbidities of diabetes and obesity antibiotics infusions were recommended; patient declined treatment.  Was instructed by her PCP to monitor oxygen saturation and if he ended dropping her symptoms worsen to presented to the emergency department for further evaluation and management.  In the last 48 hours prior to admission patient reports increased shortness of breath and at home noticing her oxygen saturation to be in the mid 80s (She does not wear oxygen at baseline).  She reports having low-grade fever and chills, and not feeling well.  Expressed some loose stools and nausea.  No chest pain, no dysuria, no hematuria, no melena, no hematochezia, no focal weakness, no headaches, no blurred vision.   ED Course: Hypoxic with oxygen saturation 88-89 on room air at rest; chest x-ray demonstrating bilateral interstitial changes, patient found to be tachypneic and tachycardic.  Low-grade temperature appreciated.   Blood work demonstrating positive inflammatory markers and elevated glucose.  500 mL bolus and 10 units of novolog given. [facility] consulted to admit patient for further evaluation and management of acute respiratory failure with hypoxia in the setting of COVID-19  pneumonia.  Review of Systems: As per HPI otherwise all other systems reviewed and are negative.   Past Medical History:  Diagnosis Date  . Anemia    slightly anemic, iron supplements every 2-3 days  . Bartholin cyst   . Chlamydia 01/2011  . Diabetes mellitus without complication (HCC)   . Family history of anesthesia complication    grandmother had spinal with nerve damage  . No pertinent past medical history     Past Surgical History:  Procedure Laterality Date  . CESAREAN SECTION N/A 11/22/2012   Procedure: PRIMARY CESAREAN SECTION;  Surgeon: Esmeralda Arthur, MD;  Location: [facility];  Service: Obstetrics;  Laterality: N/A;  . CYST REMOVAL TRUNK  2005    Social History  reports that she has never smoked. She has never used smokeless tobacco. She reports that she does not drink alcohol or use drugs.  Allergies  Allergen Reactions  . Bee Venom Swelling    Progressively worse with each sting  . Ibuprofen Hives  . Nickel Rash    Family History  Problem Relation Age of Onset  . Cancer Other   . Heart disease Other   . Diabetes Other   . Diabetes Mother   . Diabetes Father   . Diabetes Maternal Grandfather     Prior to Admission medications   Medication Sig Start Date End Date Taking? Authorizing Provider  glucose blood test strip Use as instructed 01/14/19  Yes Campbell Riches, NP  Lancets (ACCU-CHEK SOFT TOUCH) lancets Use as instructed 01/14/19  Yes Campbell Riches, NP  metFORMIN (GLUCOPHAGE) 500 MG tablet Take 1 tablet (500 mg total) by mouth 2 (two) times daily with a meal. 03/11/19  Yes Mikey Kirschner, MD  Dulaglutide (TRULICITY) 3 AT/5.5DD SOPN Inject 3 mg into the skin once a week. 03/11/19   Mikey Kirschner, MD  glipiZIDE (GLIPIZIDE XL) 10 MG 24 hr tablet Take one tablet by mouth every morning Patient not taking: Reported on 07/09/2019 03/11/19   Mikey Kirschner, MD    Physical Exam: Vitals:   07/09/19 1332 07/09/19 1333 07/09/19 1345 07/09/19 1400  BP:     118/83  Pulse: (!) 127 (!) 123    Resp: (!) 25 (!) 31 (!) 28 20  Temp:      TempSrc:      SpO2: 93% 91%    Weight:      Height:        Constitutional: In mild distress secondary to respiratory issues; easily short of breath and winded on exertion.  Requiring oxygen supplementation.  No chest pain, no nausea, no vomiting.  Currently afebrile.  Vitals:   07/09/19 1332 07/09/19 1333 07/09/19 1345 07/09/19 1400  BP:    118/83  Pulse: (!) 127 (!) 123    Resp: (!) 25 (!) 31 (!) 28 20  Temp:      TempSrc:      SpO2: 93% 91%    Weight:      Height:       Eyes: PERRL, lids and conjunctivae normal,, no nystagmus. ENMT: Mucous membranes are moist. Posterior pharynx clear of any exudate or lesions.Normal dentition.  Neck: normal, supple, no masses, no thyromegaly Respiratory: Positive tachypnea, no using accessory muscles; no wheezing.  Bilateral rhonchi appreciated. Cardiovascular: Sinus tachycardia, no rubs, no gallops, no murmurs, no JVD.    Abdomen: no tenderness, no masses palpated. No hepatosplenomegaly. Bowel sounds positive.  Musculoskeletal: no clubbing / cyanosis. No joint deformity upper and lower extremities. Good ROM, no contractures. Normal muscle tone.  Skin: no rashes, no petechiae. Neurologic: CN 2-12 grossly intact. Sensation intact, DTR normal. Strength 5/5 in all 4.  Psychiatric: Normal judgment and insight. Alert and oriented x 3. Normal mood.    Labs on Admission: I have personally reviewed following labs and imaging studies  CBC: Recent Labs  Lab 07/09/19 1116  WBC 6.9  HGB 15.9*  HCT 47.7*  MCV 89.3  PLT 220    Basic Metabolic Panel: Recent Labs  Lab 07/09/19 1138  NA 132*  K 3.7  CL 94*  CO2 23  GLUCOSE 299*  BUN 10  CREATININE 0.85  CALCIUM 8.7*    GFR: Estimated Creatinine Clearance: 112.2 mL/min (by C-G formula based on SCr of 0.85 mg/dL).  Liver Function Tests: No results for input(s): AST, ALT, ALKPHOS, BILITOT, PROT, ALBUMIN in  the last 168 hours.  Urine analysis:    Component Value Date/Time   BILIRUBINUR negative 02/17/2016 1820   KETONESUR negative 02/17/2016 1820   PROTEINUR negative 02/17/2016 1820   UROBILINOGEN 0.2 02/17/2016 1820   NITRITE Negative 02/17/2016 1820   LEUKOCYTESUR Negative 02/17/2016 1820    Radiological Exams on Admission: Chest x-ray demonstrating interstitial changes bilaterally.  EKG: Independently reviewed. Sinus tachycardia normal QT.  Assessment/Plan 1-acute respiratory failure with hypoxia ([place]): In the setting of COVID-19 pneumonia -Inflammatory markers demonstrating fibrinogen of 774, LDH 246, D-dimer 0.43, CRP 29.3, triglycerides 234, ferritin 174. -WBCs 6.9 -Basic metabolic panel demonstrating pseudohyponatremia, hyperglycemia with blood sugar in the 299 range, stable potassium level and normal renal function. -Significant tachypnea, hypoxia on room air into the 88-89 range (while resting) and tachycardia. -Judicious fluid resuscitation will be provided -Patient chest  x-ray demonstrated bilateral interstitial infiltrates compatible with Covid infection -Start treatment with remdesivir and IV steroids -As needed bronchodilators, antitussive medication and antipyretics. -will Provide supportive care and follow clinical response. -vit C and Zinc ordered -will check Vit D level -Follow inflammatory markers and LFTs.  2-type 2 diabetes -Holding oral hypoglycemic agents while inpatient -Started on Levemir and sliding scale insulin. -Anticipate elevated CBGs in the setting of his steroids treatment -Will check A1c.  3-gastroesophageal reflux disease\GI prophylaxis -Started on PPI.  4-morbid obesity -Body mass index is 36.56 kg/m. -Class III.  Classification -Low calorie diet, portion control and increase physical activity discussed with patient. -Concerns for underlying obesity hypoventilation syndrome.  5-hyponatremia/pseudohyponatremia -In the setting of  hyperglycemia mild dehydration -Controlled CBGs -Judicious fluid resuscitation overnight -Follow electrolytes trend with a.m.  6-HLD -currently not taking any hypolipidemic agent -will check lipid panel   DVT prophylaxis: Lovenox Code Status:   Full code Family Communication:  No family bedside. Disposition Plan:   Patient is from:  Home  Anticipated DC to:  Home  Anticipated DC date:  07/13/19  Anticipated DC barriers: Patient will require stabilization in her breathing status prior to be discharge for further outpatient management.  Consults called:  None Admission status:  Inpatient, telemetry, length of stay more than 2 midnights.  Severity of Illness: Inpatient; severe illness on presentation.    Vassie Loll MD [facility]  How to contact the [facility] Attending or Consulting provider 7A - 7P or covering provider during after hours 7P -7A, for this patient?   1. Check the care team in [facility] and look for a) attending/consulting [facility] provider listed and b) the [facility] team listed 2. Log into [email or web address] and use [facility]'s universal password to access. If you do not have the password, please contact the hospital operator. 3. Locate the [facility] provider you are looking for under [facility] and page to a number that you can be directly reached. 4. If you still have difficulty reaching the provider, please page the [facility] (Director on Call) for the Hospitalists listed on amion for assistance.  07/09/2019, 3:05 PM

## 2019-07-09 NOTE — Telephone Encounter (Signed)
Patient called in stating she was diagnosed with covid last week and is currently having some slight chest pain and SOB --02 monitor reading 87-89. Per Dr. Brett Canales patient was advised to go to the ER, patient voiced understanding.

## 2019-07-10 DIAGNOSIS — J9601 Acute respiratory failure with hypoxia: Secondary | ICD-10-CM | POA: Diagnosis not present

## 2019-07-10 LAB — CBC WITH DIFFERENTIAL/PLATELET
Abs Immature Granulocytes: 0.07 10*3/uL (ref 0.00–0.07)
Basophils Absolute: 0 10*3/uL (ref 0.0–0.1)
Basophils Relative: 0 %
Eosinophils Absolute: 0 10*3/uL (ref 0.0–0.5)
Eosinophils Relative: 0 %
HCT: 41.8 % (ref 36.0–46.0)
Hemoglobin: 13.5 g/dL (ref 12.0–15.0)
Immature Granulocytes: 1 %
Lymphocytes Relative: 11 %
Lymphs Abs: 0.8 10*3/uL (ref 0.7–4.0)
MCH: 29.7 pg (ref 26.0–34.0)
MCHC: 32.3 g/dL (ref 30.0–36.0)
MCV: 92.1 fL (ref 80.0–100.0)
Monocytes Absolute: 0.2 10*3/uL (ref 0.1–1.0)
Monocytes Relative: 3 %
Neutro Abs: 6.6 10*3/uL (ref 1.7–7.7)
Neutrophils Relative %: 85 %
Platelets: 192 10*3/uL (ref 150–400)
RBC: 4.54 MIL/uL (ref 3.87–5.11)
RDW: 13.1 % (ref 11.5–15.5)
WBC: 7.8 10*3/uL (ref 4.0–10.5)
nRBC: 0 % (ref 0.0–0.2)

## 2019-07-10 LAB — COMPREHENSIVE METABOLIC PANEL
ALT: 34 U/L (ref 0–44)
AST: 38 U/L (ref 15–41)
Albumin: 3.1 g/dL — ABNORMAL LOW (ref 3.5–5.0)
Alkaline Phosphatase: 68 U/L (ref 38–126)
Anion gap: 13 (ref 5–15)
BUN: 13 mg/dL (ref 6–20)
CO2: 15 mmol/L — ABNORMAL LOW (ref 22–32)
Calcium: 8.5 mg/dL — ABNORMAL LOW (ref 8.9–10.3)
Chloride: 111 mmol/L (ref 98–111)
Creatinine, Ser: 0.68 mg/dL (ref 0.44–1.00)
GFR calc Af Amer: >60 mL/min (ref >60)
GFR calc non Af Amer: >60 mL/min (ref >60)
Glucose, Bld: 318 mg/dL — ABNORMAL HIGH (ref 70–99)
Potassium: 4.3 mmol/L (ref 3.5–5.1)
Sodium: 139 mmol/L (ref 135–145)
Total Bilirubin: 1 mg/dL (ref 0.3–1.2)
Total Protein: 6.9 g/dL (ref 6.5–8.1)

## 2019-07-10 LAB — LIPID PANEL
Cholesterol: 101 mg/dL (ref 0–200)
HDL: 20 mg/dL — ABNORMAL LOW (ref >40)
LDL Cholesterol: 49 mg/dL (ref 0–99)
Total CHOL/HDL Ratio: 5.1 RATIO
Triglycerides: 160 mg/dL — ABNORMAL HIGH (ref <150)
VLDL: 32 mg/dL (ref 0–40)

## 2019-07-10 LAB — D-DIMER, QUANTITATIVE: D-Dimer, Quant: 0.34 ug/mL-FEU (ref 0.00–0.50)

## 2019-07-10 LAB — C-REACTIVE PROTEIN: CRP: 26.5 mg/dL — ABNORMAL HIGH (ref <1.0)

## 2019-07-10 LAB — FERRITIN: Ferritin: 180 ng/mL (ref 11–307)

## 2019-07-10 LAB — GLUCOSE, CAPILLARY
Glucose-Capillary: 322 mg/dL — ABNORMAL HIGH (ref 70–99)
Glucose-Capillary: 381 mg/dL — ABNORMAL HIGH (ref 70–99)

## 2019-07-10 MED ORDER — GLIPIZIDE ER 5 MG PO TB24
10.0000 mg | ORAL_TABLET | Freq: Every day | ORAL | Status: DC
  Administered 2019-07-10: 09:00:00 10 mg via ORAL
  Filled 2019-07-10: qty 2

## 2019-07-10 MED ORDER — GUAIFENESIN-DM 100-10 MG/5ML PO SYRP: 10.0000 mL | ORAL_SOLUTION | ORAL | 0 refills | Status: DC | PRN

## 2019-07-10 MED ORDER — ASCORBIC ACID 500 MG PO TABS: 500.0000 mg | ORAL_TABLET | Freq: Every day | ORAL | 0 refills | Status: AC

## 2019-07-10 MED ORDER — DEXAMETHASONE 6 MG PO TABS
6.0000 mg | ORAL_TABLET | Freq: Every day | ORAL | 0 refills | Status: AC
Start: 2019-07-10 — End: 2019-07-13

## 2019-07-10 MED ORDER — ZINC SULFATE 220 (50 ZN) MG PO CAPS: 220.0000 mg | ORAL_CAPSULE | Freq: Every day | ORAL | 0 refills | Status: AC

## 2019-07-10 MED ORDER — METFORMIN HCL 500 MG PO TABS
500.0000 mg | ORAL_TABLET | Freq: Two times a day (BID) | ORAL | Status: DC
  Administered 2019-07-10: 500 mg via ORAL
  Filled 2019-07-10: qty 1

## 2019-07-10 MED ORDER — IPRATROPIUM-ALBUTEROL 20-100 MCG/ACT IN AERS: 1.0000 | INHALATION_SPRAY | Freq: Four times a day (QID) | RESPIRATORY_TRACT | 0 refills | Status: DC | PRN

## 2019-07-10 NOTE — Progress Notes (Addendum)
Inpatient Diabetes Program Recommendations  AACE/ADA: New Consensus Statement on Inpatient Glycemic Control (2015)  Target Ranges:  Prepandial:   less than 140 mg/dL      Peak postprandial:   less than 180 mg/dL (1-2 hours)      Critically ill patients:  140 - 180 mg/dL   Lab Results  Component Value Date   GLUCAP 322 (H) 07/10/2019   HGBA1C 11.0 (H) 07/09/2019    Review of Glycemic Control  Diabetes history: DM2 Outpatient Diabetes medications: Trulicity 3 mg weekly, Glipizide 10 mg Daily, Metformin 500 mg bid Current orders for Inpatient glycemic control:  Levemir 12 units bid Novolog 0-20 units tid + hs Glipizide 10 mg Daily Metformin 500 mg bid  Solumedrol 60 mg Q12 A1c 11%  Inpatient Diabetes Program Recommendations:    Pt received one dose of Levemir so far this am.  - Add Tradjenta 5 mg Daily  - Add Novolog 6 units tid meal coverage if eating >50% of meals.  -If glucose trends continue to be elevated this evening after this mornings dose of Levemir, Consider increasing Levemir dose to 18 units bid.  Will call pt regarding A1c. Will benefit from insulin at time of d/c. Will need to do benefits check on basal insulins. Operation of insulin pen similar to her Trulicity pen she is prescribed at home.   Spoke with pt over the phone regarding her A1c level. Pt had not been taking her glipizide for 2 weeks and her metformin 1 week. Discussed possible need for insulin at time of d/c and discussed how to give insulin. Discussed s/s of hypoglycemia and treatment while on insulin and pt having steroids. Will attach directions to paperwork.   Thanks,  Christena Deem RN, MSN, BC-ADM Inpatient Diabetes Coordinator Team Pager [phone] (8a-5p)

## 2019-07-10 NOTE — Discharge Instructions (Addendum)
You are scheduled for an outpatient infusion of Remdesivir at 10:00 AM on Thursday 5/6, Friday 5/7 and Saturday 5/8.  Please report to [facility] at [address]  Drive to the security guard and tell them you are here for an infusion. They will direct you to the front entrance where we will come and get you.  For questions call [phone].  Thanks     Glucose Products:  ReliOnT glucose products raise low blood sugar fast. Tablets are free of fat, caffeine, sodium and gluten. They are portable and easy to carry, making it easier for people with diabetes to BE PREPARED for lows.  Glucose Tablets Available in 6 flavors . 10 ct...................................... $1.00 . 50 ct...................................... $3.98 Glucose Shot..................................$1.48 Glucose Gel....................................$3.44  Alcohol Swabs Alcohol swabs are used to sterilize your injection site. All of our swabs are individually wrapped for maximum safety, convenience and moisture retention. ReliOnT Alcohol Swabs . 100 ct Swabs..............................$1.00 . 400 ct Swabs..............................$3.74

## 2019-07-10 NOTE — Discharge Summary (Signed)
Physician Discharge Summary  Melanie Mccoy GQQ:[id number] DOB: April 06, 1990 DOA: 07/09/2019  PCP: Merlyn Albert, MD  Admit date: 07/09/2019  Discharge date: 07/10/2019  Admitted From:Home  Disposition:  Home  Recommendations for Outpatient Follow-up:  1. Follow up with PCP in 1-2 weeks 2. Continue on dexamethasone for 3 more days as prescribed and get remdesivir infusions from 5/6-5/8 as scheduled to complete course of treatment 3. Continue to monitor blood glucose levels at home closely and continue home medications otherwise  Home Health: None  Equipment/Devices: None  Discharge Condition: Stable  CODE STATUS: Full  Diet recommendation: Heart Healthy/carb modified  Brief/Interim Summary: Per HPI: Melanie Mccoy is a 29 y.o. female with medical history significant of type 2 diabetes mellitus, HLD, morbid obesity and gastroesophageal reflux disease; who presented to the hospital secondary to general malaise, shortness of breath, chills and nonproductive cough.  Symptoms have been present for the last 4-5 days.  Patient was diagnosed with Covid as an outpatient and giving her comorbidities of diabetes and obesity antibiotics infusions were recommended; patient declined treatment.  Was instructed by her PCP to monitor oxygen saturation and if he ended dropping her symptoms worsen to presented to the emergency department for further evaluation and management.  In the last 48 hours prior to admission patient reports increased shortness of breath and at home noticing her oxygen saturation to be in the mid 80s (She does not wear oxygen at baseline).  She reports having low-grade fever and chills, and not feeling well.  Expressed some loose stools and nausea.  No chest pain, no dysuria, no hematuria, no melena, no hematochezia, no focal weakness, no headaches, no blurred vision.  5/5: Patient was admitted with acute hypoxemic respiratory failure in the setting of COVID-19 pneumonia.  She  was also noted to have some mild hyperglycemia in the setting of steroid use with noted prior history of type 2 diabetes.  She has been weaned off of her oxygen this morning and denies any significant complaints or concerns to include no further coughing or shortness of breath.  She appears to be doing quite well and is appearing stable for discharge.  She has outpatient infusions set up which she will attend and remain on dexamethasone for 3 more days as prescribed.  Her inflammatory markers have not demonstrated any dramatic elevations and appear quite stable.  Discharge Diagnoses:  Principal Problem:   Acute respiratory failure with hypoxia (HCC) Active Problems:   Morbid obesity (HCC)   Type 2 diabetes mellitus without complication, without long-term current use of insulin (HCC)   Hyperlipidemia associated with type 2 diabetes mellitus (HCC)   COVID-19 virus infection  Principal discharge diagnosis: Acute hypoxemic respiratory failure secondary to COVID-19 pneumonia.  Discharge Instructions  Discharge Instructions    Diet - low sodium heart healthy   Complete by: As directed    Increase activity slowly   Complete by: As directed      Allergies as of 07/10/2019      Reactions   Bee Venom Swelling   Progressively worse with each sting   Ibuprofen Hives   Nickel Rash      Medication List    TAKE these medications   accu-chek soft touch lancets Use as instructed   ascorbic acid 500 MG tablet Commonly known as: VITAMIN C Take 1 tablet (500 mg total) by mouth daily for 10 days. Start taking on: Jul 11, 2019   dexamethasone 6 MG tablet Commonly known as: DECADRON  Take 1 tablet (6 mg total) by mouth daily for 3 days.   glipiZIDE 10 MG 24 hr tablet Commonly known as: glipiZIDE XL Take one tablet by mouth every morning   glucose blood test strip Use as instructed   guaiFENesin-dextromethorphan 100-10 MG/5ML syrup Commonly known as: ROBITUSSIN DM Take 10 mLs by mouth every  4 (four) hours as needed for cough.   Ipratropium-Albuterol 20-100 MCG/ACT Aers respimat Commonly known as: COMBIVENT Inhale 1 puff into the lungs every 6 (six) hours as needed for wheezing.   metFORMIN 500 MG tablet Commonly known as: GLUCOPHAGE Take 1 tablet (500 mg total) by mouth 2 (two) times daily with a meal.   Trulicity 3 MG/0.5ML Sopn Generic drug: Dulaglutide Inject 3 mg into the skin once a week.   zinc sulfate 220 (50 Zn) MG capsule Take 1 capsule (220 mg total) by mouth daily for 10 days. Start taking on: Jul 11, 2019      Follow-up Information    Merlyn Albert, MD Follow up in 1 week(s).   Specialty: Family Medicine Contact information: [address] [place] [address] [phone]          Allergies  Allergen Reactions  . Bee Venom Swelling    Progressively worse with each sting  . Ibuprofen Hives  . Nickel Rash    Consultations:  None   Procedures/Studies: DG Chest Port 1 View  Result Date: 07/09/2019 CLINICAL DATA:  Cough, COVID. Additional provided: Dry cough, generalized fatigue, decreased appetite and occasional loose stools with no fever for 8 days. EXAM: PORTABLE CHEST 1 VIEW COMPARISON:  Prior radiographs of the left ribs and chest 01/08/2018. FINDINGS: Heart size within normal limits. There is suggestion of subtle nodular and interstitial opacity scattered throughout both lungs. No confluent airspace consolidation. No evidence of pleural effusion or pneumothorax. No acute bony abnormality is identified IMPRESSION: Suggestion of subtle bilateral nodular and interstitial airspace opacities suspicious for atypical/viral pneumonia given the provided history. Radiographic follow-up to resolution recommended. Electronically Signed   By: Jackey Loge DO   On: 07/09/2019 11:36     Discharge Exam: Vitals:   07/10/19 0605 07/10/19 0800  BP: 134/86   Pulse: (!) 106   Resp:    Temp:    SpO2:  94%   Vitals:   07/10/19 0507  07/10/19 0511 07/10/19 0605 07/10/19 0800  BP:  (!) 132/91 134/86   Pulse:  (!) 108 (!) 106   Resp:  20    Temp:  98.1 F (36.7 C)    TempSrc:      SpO2:  94%  94%  Weight: 93.4 kg     Height:        General: Pt is alert, awake, not in acute distress Cardiovascular: RRR, S1/S2 +, no rubs, no gallops Respiratory: CTA bilaterally, no wheezing, no rhonchi Abdominal: Soft, NT, ND, bowel sounds + Extremities: no edema, no cyanosis    The results of significant diagnostics from this hospitalization (including imaging, microbiology, ancillary and laboratory) are listed below for reference.     Microbiology: Recent Results (from the past 240 hour(s))  Respiratory Panel by RT PCR (Flu A&B, Covid) - Nasopharyngeal Swab     Status: Abnormal   Collection Time: 07/09/19  4:20 PM   Specimen: Nasopharyngeal Swab  Result Value Ref Range Status   SARS Coronavirus 2 by RT PCR POSITIVE (A) NEGATIVE Final    Comment: RESULT CALLED TO, READ BACK BY AND VERIFIED WITH: VOGLER,T@1757  BY  MATTHEWS,B 5.4.2021 (NOTE) SARS-CoV-2 target nucleic acids are DETECTED. SARS-CoV-2 RNA is generally detectable in upper respiratory specimens  during the acute phase of infection. Positive results are indicative of the presence of the identified virus, but do not rule out bacterial infection or co-infection with other pathogens not detected by the test. Clinical correlation with patient history and other diagnostic information is necessary to determine patient infection status. The expected result is Negative. Fact Sheet for Patients:  https://www.moore.com/ Fact Sheet for Healthcare Providers: https://www.young.biz/ This test is not yet approved or cleared by the [place] FDA and  has been authorized for detection and/or diagnosis of SARS-CoV-2 by FDA under an Emergency Use Authorization (EUA).  This EUA will remain in effect (meaning this test can be used)  for the  duration of  the COVID-19 declaration under Section 564(b)(1) of the Act, 21 U.S.C. section 360bbb-3(b)(1), unless the authorization is terminated or revoked sooner.    Influenza A by PCR NEGATIVE NEGATIVE Final   Influenza B by PCR NEGATIVE NEGATIVE Final    Comment: (NOTE) The Xpert Xpress SARS-CoV-2/FLU/RSV assay is intended as an aid in  the diagnosis of influenza from Nasopharyngeal swab specimens and  should not be used as a sole basis for treatment. Nasal washings and  aspirates are unacceptable for Xpert Xpress SARS-CoV-2/FLU/RSV  testing. Fact Sheet for Patients: https://www.moore.com/ Fact Sheet for Healthcare Providers: https://www.young.biz/ This test is not yet approved or cleared by the [place] FDA and  has been authorized for detection and/or diagnosis of SARS-CoV-2 by  FDA under an Emergency Use Authorization (EUA). This EUA will remain  in effect (meaning this test can be used) for the duration of the  Covid-19 declaration under Section 564(b)(1) of the Act, 21  U.S.C. section 360bbb-3(b)(1), unless the authorization is  terminated or revoked. Performed at [facility], [address], [place], [place] [address]      Labs: BNP (last 3 results) No results for input(s): BNP in the last 8760 hours. Basic Metabolic Panel: Recent Labs  Lab 07/09/19 1138 07/10/19 0506  NA 132* 139  K 3.7 4.3  CL 94* 111  CO2 23 15*  GLUCOSE 299* 318*  BUN 10 13  CREATININE 0.85 0.68  CALCIUM 8.7* 8.5*   Liver Function Tests: Recent Labs  Lab 07/10/19 0506  AST 38  ALT 34  ALKPHOS 68  BILITOT 1.0  PROT 6.9  ALBUMIN 3.1*   No results for input(s): LIPASE, AMYLASE in the last 168 hours. No results for input(s): AMMONIA in the last 168 hours. CBC: Recent Labs  Lab 07/09/19 1116 07/10/19 0506  WBC 6.9 7.8  NEUTROABS  --  6.6  HGB 15.9* 13.5  HCT 47.7* 41.8  MCV 89.3 92.1  PLT 176 192   Cardiac Enzymes: No results  for input(s): CKTOTAL, CKMB, CKMBINDEX, TROPONINI in the last 168 hours. BNP: Invalid input(s): POCBNP CBG: Recent Labs  Lab 07/09/19 1659 07/09/19 2138 07/10/19 0728  GLUCAP 220* 380* 322*   D-Dimer Recent Labs    07/09/19 1330 07/10/19 0506  DDIMER 0.43 0.34   Hgb A1c Recent Labs    07/09/19 1536  HGBA1C 11.0*   Lipid Profile Recent Labs    07/09/19 1138 07/10/19 0506  CHOL  --  101  HDL  --  20*  LDLCALC  --  49  TRIG 234* 160*  CHOLHDL  --  5.1   Thyroid function studies No results for input(s): TSH, T4TOTAL, T3FREE, THYROIDAB in the last 72 hours.  Invalid input(s): FREET3 Anemia work up Recent Labs    07/09/19 1138 07/10/19 0506  FERRITIN 174 180   Urinalysis    Component Value Date/Time   BILIRUBINUR negative 02/17/2016 1820   KETONESUR negative 02/17/2016 1820   PROTEINUR negative 02/17/2016 1820   UROBILINOGEN 0.2 02/17/2016 1820   NITRITE Negative 02/17/2016 1820   LEUKOCYTESUR Negative 02/17/2016 1820   Sepsis Labs Invalid input(s): PROCALCITONIN,  WBC,  LACTICIDVEN Microbiology Recent Results (from the past 240 hour(s))  Respiratory Panel by RT PCR (Flu A&B, Covid) - Nasopharyngeal Swab     Status: Abnormal   Collection Time: 07/09/19  4:20 PM   Specimen: Nasopharyngeal Swab  Result Value Ref Range Status   SARS Coronavirus 2 by RT PCR POSITIVE (A) NEGATIVE Final    Comment: RESULT CALLED TO, READ BACK BY AND VERIFIED WITH: VOGLER,T@1757  BY MATTHEWS,B 5.4.2021 (NOTE) SARS-CoV-2 target nucleic acids are DETECTED. SARS-CoV-2 RNA is generally detectable in upper respiratory specimens  during the acute phase of infection. Positive results are indicative of the presence of the identified virus, but do not rule out bacterial infection or co-infection with other pathogens not detected by the test. Clinical correlation with patient history and other diagnostic information is necessary to determine patient infection status. The expected  result is Negative. Fact Sheet for Patients:  PinkCheek.be Fact Sheet for Healthcare Providers: GravelBags.it This test is not yet approved or cleared by the [place] FDA and  has been authorized for detection and/or diagnosis of SARS-CoV-2 by FDA under an Emergency Use Authorization (EUA).  This EUA will remain in effect (meaning this test can be used)  for the duration of  the COVID-19 declaration under Section 564(b)(1) of the Act, 21 U.S.C. section 360bbb-3(b)(1), unless the authorization is terminated or revoked sooner.    Influenza A by PCR NEGATIVE NEGATIVE Final   Influenza B by PCR NEGATIVE NEGATIVE Final    Comment: (NOTE) The Xpert Xpress SARS-CoV-2/FLU/RSV assay is intended as an aid in  the diagnosis of influenza from Nasopharyngeal swab specimens and  should not be used as a sole basis for treatment. Nasal washings and  aspirates are unacceptable for Xpert Xpress SARS-CoV-2/FLU/RSV  testing. Fact Sheet for Patients: PinkCheek.be Fact Sheet for Healthcare Providers: GravelBags.it This test is not yet approved or cleared by the [place] FDA and  has been authorized for detection and/or diagnosis of SARS-CoV-2 by  FDA under an Emergency Use Authorization (EUA). This EUA will remain  in effect (meaning this test can be used) for the duration of the  Covid-19 declaration under Section 564(b)(1) of the Act, 21  U.S.C. section 360bbb-3(b)(1), unless the authorization is  terminated or revoked. Performed at [facility], [address] [place], [place] [address]      Time coordinating discharge: 35 minutes  SIGNED:   Rodena Goldmann, DO [facility] 07/10/2019, 11:13 AM  If 7PM-7AM, please contact night-coverage [email or web address]

## 2019-07-10 NOTE — Progress Notes (Signed)
Patient scheduled for outpatient Remdesivir infusion at 10:00 AM on Thursday 5/6, Friday 5/7 and Saturday 5/8.  Please advise them to report to [facility] at [address]  Drive to the security guard and tell them you are here for an infusion. They will direct you to the front entrance where we will come and get you.  For questions call [phone].  Thanks

## 2019-07-10 NOTE — TOC Progression Note (Signed)
Transition of Care [facility]) - Progression Note    Patient Details  Name: Melanie Mccoy MRN: [id number] Date of Birth: Jul 20, 1990  Transition of Care [facility]) CM/SW Contact  Karn [place], [place] Phone Number: 07/10/2019, 12:11 PM  Clinical Narrative:   Pt discharging today. MSW confirmed appointments at [facility] for Remdesivir on May 6, May 7, and May 8 at 10:00 AM. Pt reports she has transportation. Appointments on AVS. Will sign off.

## 2019-07-10 NOTE — Plan of Care (Signed)

## 2019-07-10 NOTE — Progress Notes (Signed)
MD notified of bp 134/91 and pulse 115. New order given and medication administered with effective results.

## 2019-07-11 ENCOUNTER — Ambulatory Visit (HOSPITAL_COMMUNITY)
Admit: 2019-07-11 | Discharge: 2019-07-11 | Disposition: A | Discharge status: Home / Self Care | Payer: 59 | Source: Ambulatory Visit | Attending: Pulmonary Disease | Admitting: Pulmonary Disease

## 2019-07-11 ENCOUNTER — Telehealth: Payer: Self-pay | Admitting: Family Medicine

## 2019-07-11 ENCOUNTER — Other Ambulatory Visit: Payer: Self-pay | Admitting: *Deleted

## 2019-07-11 DIAGNOSIS — J1282 Pneumonia due to coronavirus disease 2019: Secondary | ICD-10-CM | POA: Diagnosis not present

## 2019-07-11 DIAGNOSIS — U071 COVID-19: Secondary | ICD-10-CM | POA: Diagnosis not present

## 2019-07-11 MED ORDER — SODIUM CHLORIDE 0.9 % IV SOLN: INTRAVENOUS | Status: DC | PRN

## 2019-07-11 MED ORDER — INSULIN DETEMIR 100 UNIT/ML ~~LOC~~ SOLN
SUBCUTANEOUS | 0 refills | Status: DC
Start: 2019-07-11 — End: 2019-07-11

## 2019-07-11 MED ORDER — METHYLPREDNISOLONE SODIUM SUCC 125 MG IJ SOLR: 125.0000 mg | Freq: Once | INTRAMUSCULAR | Status: DC | PRN

## 2019-07-11 MED ORDER — DIPHENHYDRAMINE HCL 50 MG/ML IJ SOLN: 50.0000 mg | Freq: Once | INTRAMUSCULAR | Status: DC | PRN

## 2019-07-11 MED ORDER — ALBUTEROL SULFATE HFA 108 (90 BASE) MCG/ACT IN AERS: 2.0000 | INHALATION_SPRAY | Freq: Once | RESPIRATORY_TRACT | Status: DC | PRN

## 2019-07-11 MED ORDER — SODIUM CHLORIDE 0.9 % IV SOLN
100.0000 mg | Freq: Once | INTRAVENOUS | Status: AC
  Administered 2019-07-11: 100 mg via INTRAVENOUS
  Filled 2019-07-11: qty 20

## 2019-07-11 MED ORDER — FAMOTIDINE IN NACL 20-0.9 MG/50ML-% IV SOLN: 20.0000 mg | Freq: Once | INTRAVENOUS | Status: DC | PRN

## 2019-07-11 MED ORDER — EPINEPHRINE 0.3 MG/0.3ML IJ SOAJ: 0.3000 mg | Freq: Once | INTRAMUSCULAR | Status: DC | PRN

## 2019-07-11 MED ORDER — LEVEMIR FLEXTOUCH 100 UNIT/ML ~~LOC~~ SOPN: 12.0000 [IU] | PEN_INJECTOR | Freq: Every day | SUBCUTANEOUS | 0 refills | Status: DC

## 2019-07-11 NOTE — Telephone Encounter (Signed)
Called and discussed with pt and rx changed to pens

## 2019-07-11 NOTE — Telephone Encounter (Signed)
Patient called wanting to talk to nurse about changing RX that was prescribe for insuline recently.

## 2019-07-11 NOTE — Telephone Encounter (Signed)
Called and discussed with pt and she verbalized understanding. Med sent to pharm.

## 2019-07-11 NOTE — Progress Notes (Signed)
  Diagnosis: COVID-19  Physician:Dr Wright  Procedure: [facility] Med: remdesivir infusion - Provided patient with remdesivir fact sheet for patients, parents and caregivers prior to infusion.  Complications: No immediate complications noted.  Discharge: Discharged home   Melanie Mccoy 07/11/2019  

## 2019-07-11 NOTE — Progress Notes (Signed)
  Diagnosis: COVID-19  Physician: Dr. Wright  Procedure: [facility] Infusion Clinic Med: remdesivir infusion - Provided patient with remdesivir fact sheet for patients, parents and caregivers prior to infusion.  Complications: No immediate complications noted.  Discharge: Discharged home   Aleiah Mohammed 07/11/2019   

## 2019-07-11 NOTE — Telephone Encounter (Signed)
Rx changed to pens and discussed with pt to take twice daily. She verbalized understanding.

## 2019-07-11 NOTE — Telephone Encounter (Signed)
Start levemir 12 units bid as recommended in the hospital for her as an inpatient. Ck sugars twice per day. Melanie Mccoy is fasting sugars below 200 by next week. Perfect control not our goal currently

## 2019-07-11 NOTE — Telephone Encounter (Signed)
Patient states fasting blood sugar reading was 304 this morning.

## 2019-07-11 NOTE — Telephone Encounter (Signed)
Patient states for her insurance to pay for prescription she would need the pen and needles instead of what was sent in. She wants to know how long will she be on insulin and how much to take.She thought it was only through the weekend. They filled it for a month.Please advise [facility] [address]

## 2019-07-11 NOTE — Discharge Instructions (Signed)
10 Things You Can Do to Manage Your COVID-19 Symptoms at Home If you have possible or confirmed COVID-19: 1. Stay home from work and school. And stay away from other public places. If you must go out, avoid using any kind of public transportation, ridesharing, or taxis. 2. Monitor your symptoms carefully. If your symptoms get worse, call your healthcare provider immediately. 3. Get rest and stay hydrated. 4. If you have a medical appointment, call the healthcare provider ahead of time and tell them that you have or may have COVID-19. 5. For medical emergencies, call 911 and notify the dispatch personnel that you have or may have COVID-19. 6. Cover your cough and sneezes with a tissue or use the inside of your elbow. 7. Wash your hands often with soap and water for at least 20 seconds or clean your hands with an alcohol-based hand sanitizer that contains at least 60% alcohol. 8. As much as possible, stay in a specific room and away from other people in your home. Also, you should use a separate bathroom, if available. If you need to be around other people in or outside of the home, wear a mask. 9. Avoid sharing personal items with other people in your household, like dishes, towels, and bedding. 10. Clean all surfaces that are touched often, like counters, tabletops, and doorknobs. Use household cleaning sprays or wipes according to the label instructions. What types of side effects do monoclonal antibody drugs cause?  Common side effects  In general, the more common side effects caused by monoclonal antibody drugs include: Allergic reactions, such as hives or itching Flu-like signs and symptoms, including chills, fatigue, fever, and muscle aches and pains Nausea, vomiting Diarrhea Skin rashes Low blood pressure   The CDC is recommending patients who receive monoclonal antibody treatments wait at least 90 days before being vaccinated.  11. Currently, there are no data on the safety and  efficacy of mRNA COVID-19 vaccines in persons who received monoclonal antibodies or convalescent plasma as part of COVID-19 treatment. Based on the estimated half-life of such therapies as well as evidence suggesting that reinfection is uncommon in the 90 days after initial infection, vaccination should be deferred for at least 90 days, as a precautionary measure until additional information becomes available, to avoid interference of the antibody treatment with vaccine-induced immune responses. cdc.gov/coronavirus 09/05/2018 This information is not intended to replace advice given to you by your health care provider. Make sure you discuss any questions you have with your health care provider. Document Revised: 02/07/2019 Document Reviewed: 02/07/2019 Elsevier Patient Education  2020 Elsevier Inc.  

## 2019-07-12 ENCOUNTER — Ambulatory Visit (HOSPITAL_COMMUNITY)
Admit: 2019-07-12 | Discharge: 2019-07-12 | Disposition: A | Discharge status: Home / Self Care | Payer: 59 | Attending: Pulmonary Disease | Admitting: Pulmonary Disease

## 2019-07-12 ENCOUNTER — Telehealth: Payer: Self-pay | Admitting: *Deleted

## 2019-07-12 DIAGNOSIS — U071 COVID-19: Secondary | ICD-10-CM | POA: Diagnosis not present

## 2019-07-12 MED ORDER — METHYLPREDNISOLONE SODIUM SUCC 125 MG IJ SOLR: 125.0000 mg | Freq: Once | INTRAMUSCULAR | Status: DC | PRN

## 2019-07-12 MED ORDER — ALBUTEROL SULFATE HFA 108 (90 BASE) MCG/ACT IN AERS: 2.0000 | INHALATION_SPRAY | Freq: Once | RESPIRATORY_TRACT | Status: DC | PRN

## 2019-07-12 MED ORDER — FAMOTIDINE IN NACL 20-0.9 MG/50ML-% IV SOLN: 20.0000 mg | Freq: Once | INTRAVENOUS | Status: DC | PRN

## 2019-07-12 MED ORDER — SODIUM CHLORIDE 0.9 % IV SOLN
100.0000 mg | Freq: Once | INTRAVENOUS | Status: AC
  Administered 2019-07-12: 100 mg via INTRAVENOUS
  Filled 2019-07-12: qty 20

## 2019-07-12 MED ORDER — SODIUM CHLORIDE 0.9 % IV SOLN: INTRAVENOUS | Status: DC | PRN

## 2019-07-12 MED ORDER — EPINEPHRINE 0.3 MG/0.3ML IJ SOAJ: 0.3000 mg | Freq: Once | INTRAMUSCULAR | Status: DC | PRN

## 2019-07-12 MED ORDER — LEVEMIR FLEXTOUCH 100 UNIT/ML ~~LOC~~ SOPN: PEN_INJECTOR | SUBCUTANEOUS | 0 refills | Status: DC

## 2019-07-12 MED ORDER — DIPHENHYDRAMINE HCL 50 MG/ML IJ SOLN: 50.0000 mg | Freq: Once | INTRAMUSCULAR | Status: DC | PRN

## 2019-07-12 NOTE — Progress Notes (Addendum)
  Diagnosis: COVID-19  Physician: Shan Levans Procedure: [facility] Infusion Clinic Med: remdesivir infusion - Provided patient with remdesivir fact sheet for patients, parents and caregivers prior to infusion.  Complications: No immediate complications noted.  Discharge: Discharged home   Melanie Mccoy C 07/12/2019   Advised patient to follow up with physician about elevated blood pressures.  She stated that he is aware but no BP meds given.  Also educated patient on DM management as she gave herself her first dose of insulin but did not have any breakfast prior to coming in.  Spoke about potential for concerning low blood sugar and how to treat if she experience it. Melanie Mccoy C 11:08 AM

## 2019-07-12 NOTE — Telephone Encounter (Signed)
The hospital recommended pt use twice per day, and the pt had been rather adamant about going with the hospital recommendations. So I usually rx once per day, but with a relatively low amount of twelve units, I think twice per day for now is appropriate. Once her morning sugars start falling below 120 for a few mornings in a row, back off to 8 nbid. Pt will see dr Ladona Ridgel next week and we will adjust at that appt if necessary. This will be the last call I am responding to [place]. PLEASE notify all nurses of this so I do not get another message thx!:)

## 2019-07-12 NOTE — Telephone Encounter (Signed)
Pt contacted and verbalized understanding. Med updated in Epic to reflect 12 units BID

## 2019-07-12 NOTE — Telephone Encounter (Signed)
Patient called wanting clarity on insulin. Message from yesterday states 12 units bid but script was sent in for once a day. Also patient questioning how long you recommend she do the injections.

## 2019-07-13 ENCOUNTER — Ambulatory Visit (HOSPITAL_COMMUNITY)
Admit: 2019-07-13 | Discharge: 2019-07-13 | Disposition: A | Discharge status: Home / Self Care | Payer: 59 | Attending: Pulmonary Disease | Admitting: Pulmonary Disease

## 2019-07-13 DIAGNOSIS — U071 COVID-19: Secondary | ICD-10-CM | POA: Diagnosis not present

## 2019-07-13 MED ORDER — SODIUM CHLORIDE 0.9 % IV SOLN: 100.0000 mg | Freq: Once | INTRAVENOUS | Status: DC

## 2019-07-13 MED ORDER — EPINEPHRINE 0.3 MG/0.3ML IJ SOAJ: 0.3000 mg | Freq: Once | INTRAMUSCULAR | Status: DC | PRN

## 2019-07-13 MED ORDER — FAMOTIDINE IN NACL 20-0.9 MG/50ML-% IV SOLN: 20.0000 mg | Freq: Once | INTRAVENOUS | Status: DC | PRN

## 2019-07-13 MED ORDER — DIPHENHYDRAMINE HCL 50 MG/ML IJ SOLN: 50.0000 mg | Freq: Once | INTRAMUSCULAR | Status: DC | PRN

## 2019-07-13 MED ORDER — SODIUM CHLORIDE 0.9 % IV SOLN
100.0000 mg | Freq: Once | INTRAVENOUS | Status: AC
  Administered 2019-07-13: 11:00:00 100 mg via INTRAVENOUS
  Filled 2019-07-13: qty 20

## 2019-07-13 MED ORDER — SODIUM CHLORIDE 0.9 % IV SOLN: INTRAVENOUS | Status: DC | PRN

## 2019-07-13 MED ORDER — METHYLPREDNISOLONE SODIUM SUCC 125 MG IJ SOLR: 125.0000 mg | Freq: Once | INTRAMUSCULAR | Status: DC | PRN

## 2019-07-13 MED ORDER — ALBUTEROL SULFATE HFA 108 (90 BASE) MCG/ACT IN AERS: 2.0000 | INHALATION_SPRAY | Freq: Once | RESPIRATORY_TRACT | Status: DC | PRN

## 2019-07-13 NOTE — Progress Notes (Signed)
  Diagnosis: COVID-19  Physician:dr wright   Procedure: [facility] Infusion Clinic Med: remdesivir infusion - Provided patient with remdesivir fact sheet for patients, parents and caregivers prior to infusion.  Complications: No immediate complications noted.  Discharge: Discharged home   Melanie Mccoy S Nekayla Heider 07/13/2019

## 2019-07-13 NOTE — Discharge Instructions (Signed)
COVID-19 COVID-19 is a respiratory infection that is caused by a virus called severe acute respiratory syndrome coronavirus 2 (SARS-CoV-2). The disease is also known as coronavirus disease or novel coronavirus. In some people, the virus may not cause any symptoms. In others, it may cause a serious infection. The infection can get worse quickly and can lead to complications, such as:  Pneumonia, or infection of the lungs.  Acute respiratory distress syndrome or ARDS. This is a condition in which fluid build-up in the lungs prevents the lungs from filling with air and passing oxygen into the blood.  Acute respiratory failure. This is a condition in which there is not enough oxygen passing from the lungs to the body or when carbon dioxide is not passing from the lungs out of the body.  Sepsis or septic shock. This is a serious bodily reaction to an infection.  Blood clotting problems.  Secondary infections due to bacteria or fungus.  Organ failure. This is when your body's organs stop working. The virus that causes COVID-19 is contagious. This means that it can spread from person to person through droplets from coughs and sneezes (respiratory secretions). What are the causes? This illness is caused by a virus. You may catch the virus by:  Breathing in droplets from an infected person. Droplets can be spread by a person breathing, speaking, singing, coughing, or sneezing.  Touching something, like a table or a doorknob, that was exposed to the virus (contaminated) and then touching your mouth, nose, or eyes. What increases the risk? Risk for infection You are more likely to be infected with this virus if you:  Are within 6 feet (2 meters) of a person with COVID-19.  Provide care for or live with a person who is infected with COVID-19.  Spend time in crowded indoor spaces or live in shared housing. Risk for serious illness You are more likely to become seriously ill from the virus if you:   Are 50 years of age or older. The higher your age, the more you are at risk for serious illness.  Live in a nursing home or long-term care facility.  Have cancer.  Have a long-term (chronic) disease such as: ? Chronic lung disease, including chronic obstructive pulmonary disease or asthma. ? A long-term disease that lowers your body's ability to fight infection (immunocompromised). ? Heart disease, including heart failure, a condition in which the arteries that lead to the heart become narrow or blocked (coronary artery disease), a disease which makes the heart muscle thick, weak, or stiff (cardiomyopathy). ? Diabetes. ? Chronic kidney disease. ? Sickle cell disease, a condition in which red blood cells have an abnormal "sickle" shape. ? Liver disease.  Are obese. What are the signs or symptoms? Symptoms of this condition can range from mild to severe. Symptoms may appear any time from 2 to 14 days after being exposed to the virus. They include:  A fever or chills.  A cough.  Difficulty breathing.  Headaches, body aches, or muscle aches.  Runny or stuffy (congested) nose.  A sore throat.  New loss of taste or smell. Some people may also have stomach problems, such as nausea, vomiting, or diarrhea. Other people may not have any symptoms of COVID-19. How is this diagnosed? This condition may be diagnosed based on:  Your signs and symptoms, especially if: ? You live in an area with a COVID-19 outbreak. ? You recently traveled to or from an area where the virus is common. ? You   provide care for or live with a person who was diagnosed with COVID-19. ? You were exposed to a person who was diagnosed with COVID-19.  A physical exam.  Lab tests, which may include: ? Taking a sample of fluid from the back of your nose and throat (nasopharyngeal fluid), your nose, or your throat using a swab. ? A sample of mucus from your lungs (sputum). ? Blood tests.  Imaging tests, which  may include, X-rays, CT scan, or ultrasound. How is this treated? At present, there is no medicine to treat COVID-19. Medicines that treat other diseases are being used on a trial basis to see if they are effective against COVID-19. Your health care provider will talk with you about ways to treat your symptoms. For most people, the infection is mild and can be managed at home with rest, fluids, and over-the-counter medicines. Treatment for a serious infection usually takes places in a hospital intensive care unit (ICU). It may include one or more of the following treatments. These treatments are given until your symptoms improve.  Receiving fluids and medicines through an IV.  Supplemental oxygen. Extra oxygen is given through a tube in the nose, a face mask, or a hood.  Positioning you to lie on your stomach (prone position). This makes it easier for oxygen to get into the lungs.  Continuous positive airway pressure (CPAP) or bi-level positive airway pressure (BPAP) machine. This treatment uses mild air pressure to keep the airways open. A tube that is connected to a motor delivers oxygen to the body.  Ventilator. This treatment moves air into and out of the lungs by using a tube that is placed in your windpipe.  Tracheostomy. This is a procedure to create a hole in the neck so that a breathing tube can be inserted.  Extracorporeal membrane oxygenation (ECMO). This procedure gives the lungs a chance to recover by taking over the functions of the heart and lungs. It supplies oxygen to the body and removes carbon dioxide. Follow these instructions at home: Lifestyle  If you are sick, stay home except to get medical care. Your health care provider will tell you how long to stay home. Call your health care provider before you go for medical care.  Rest at home as told by your health care provider.  Do not use any products that contain nicotine or tobacco, such as cigarettes, e-cigarettes, and  chewing tobacco. If you need help quitting, ask your health care provider.  Return to your normal activities as told by your health care provider. Ask your health care provider what activities are safe for you. General instructions  Take over-the-counter and prescription medicines only as told by your health care provider.  Drink enough fluid to keep your urine pale yellow.  Keep all follow-up visits as told by your health care provider. This is important. How is this prevented?  There is no vaccine to help prevent COVID-19 infection. However, there are steps you can take to protect yourself and others from this virus. To protect yourself:   Do not travel to areas where COVID-19 is a risk. The areas where COVID-19 is reported change often. To identify high-risk areas and travel restrictions, check the CDC travel website: wwwnc.cdc.gov/travel/notices  If you live in, or must travel to, an area where COVID-19 is a risk, take precautions to avoid infection. ? Stay away from people who are sick. ? Wash your hands often with soap and water for 20 seconds. If soap and water   are not available, use an alcohol-based hand sanitizer. ? Avoid touching your mouth, face, eyes, or nose. ? Avoid going out in public, follow guidance from your state and local health authorities. ? If you must go out in public, wear a cloth face covering or face mask. Make sure your mask covers your nose and mouth. ? Avoid crowded indoor spaces. Stay at least 6 feet (2 meters) away from others. ? Disinfect objects and surfaces that are frequently touched every day. This may include:  Counters and tables.  Doorknobs and light switches.  Sinks and faucets.  Electronics, such as phones, remote controls, keyboards, computers, and tablets. To protect others: If you have symptoms of COVID-19, take steps to prevent the virus from spreading to others.  If you think you have a COVID-19 infection, contact your health care  provider right away. Tell your health care team that you think you may have a COVID-19 infection.  Stay home. Leave your house only to seek medical care. Do not use public transport.  Do not travel while you are sick.  Wash your hands often with soap and water for 20 seconds. If soap and water are not available, use alcohol-based hand sanitizer.  Stay away from other members of your household. Let healthy household members care for children and pets, if possible. If you have to care for children or pets, wash your hands often and wear a mask. If possible, stay in your own room, separate from others. Use a different bathroom.  Make sure that all people in your household wash their hands well and often.  Cough or sneeze into a tissue or your sleeve or elbow. Do not cough or sneeze into your hand or into the air.  Wear a cloth face covering or face mask. Make sure your mask covers your nose and mouth. Where to find more information  Centers for Disease Control and Prevention: www.cdc.gov/coronavirus/2019-ncov/index.html  World Health Organization: www.who.int/health-topics/coronavirus Contact a health care provider if:  You live in or have traveled to an area where COVID-19 is a risk and you have symptoms of the infection.  You have had contact with someone who has COVID-19 and you have symptoms of the infection. Get help right away if:  You have trouble breathing.  You have pain or pressure in your chest.  You have confusion.  You have bluish lips and fingernails.  You have difficulty waking from sleep.  You have symptoms that get worse. These symptoms may represent a serious problem that is an emergency. Do not wait to see if the symptoms will go away. Get medical help right away. Call your local emergency services (911 in the [place] Do not drive yourself to the hospital. Let the emergency medical personnel know if you think you have COVID-19. Summary  COVID-19 is a  respiratory infection that is caused by a virus. It is also known as coronavirus disease or novel coronavirus. It can cause serious infections, such as pneumonia, acute respiratory distress syndrome, acute respiratory failure, or sepsis.  The virus that causes COVID-19 is contagious. This means that it can spread from person to person through droplets from breathing, speaking, singing, coughing, or sneezing.  You are more likely to develop a serious illness if you are 50 years of age or older, have a weak immune system, live in a nursing home, or have chronic disease.  There is no medicine to treat COVID-19. Your health care provider will talk with you about ways to treat your symptoms.    Take steps to protect yourself and others from infection. Wash your hands often and disinfect objects and surfaces that are frequently touched every day. Stay away from people who are sick and wear a mask if you are sick. This information is not intended to replace advice given to you by your health care provider. Make sure you discuss any questions you have with your health care provider. Document Revised: 12/21/2018 Document Reviewed: 03/29/2018 Elsevier Patient Education  2020 [employer]  

## 2019-07-17 ENCOUNTER — Telehealth: Payer: Self-pay | Admitting: *Deleted

## 2019-07-17 ENCOUNTER — Encounter: Payer: Self-pay | Admitting: Family Medicine

## 2019-07-17 ENCOUNTER — Telehealth (INDEPENDENT_AMBULATORY_CARE_PROVIDER_SITE_OTHER): Payer: 59 | Admitting: Family Medicine

## 2019-07-17 ENCOUNTER — Other Ambulatory Visit: Payer: Self-pay

## 2019-07-17 VITALS — BP 113/86 | Ht 64.0 in | Wt 201.0 lb

## 2019-07-17 DIAGNOSIS — J9601 Acute respiratory failure with hypoxia: Secondary | ICD-10-CM | POA: Diagnosis not present

## 2019-07-17 DIAGNOSIS — U071 COVID-19: Secondary | ICD-10-CM | POA: Diagnosis not present

## 2019-07-17 DIAGNOSIS — E119 Type 2 diabetes mellitus without complications: Secondary | ICD-10-CM | POA: Diagnosis not present

## 2019-07-17 DIAGNOSIS — Z09 Encounter for follow-up examination after completed treatment for conditions other than malignant neoplasm: Secondary | ICD-10-CM

## 2019-07-17 DIAGNOSIS — I10 Essential (primary) hypertension: Secondary | ICD-10-CM | POA: Diagnosis not present

## 2019-07-17 MED ORDER — LEVEMIR FLEXTOUCH 100 UNIT/ML ~~LOC~~ SOPN: PEN_INJECTOR | SUBCUTANEOUS | 2 refills | Status: DC

## 2019-07-17 MED ORDER — METFORMIN HCL 500 MG PO TABS: 500.0000 mg | ORAL_TABLET | Freq: Two times a day (BID) | ORAL | 3 refills | Status: DC

## 2019-07-17 MED ORDER — LISINOPRIL 5 MG PO TABS: 5.0000 mg | ORAL_TABLET | Freq: Every day | ORAL | 1 refills | Status: DC

## 2019-07-17 NOTE — Progress Notes (Signed)
Patient ID: Melanie Mccoy, female    DOB: [birth year], 29 y.o.   MRN: [id number]   Chief Complaint  Patient presents with  . Hospitalization Follow-up   Subjective:    HPI  Pt chose phone visit instead of video visit for follow up hospitalization stay dx with covid 19 virus with acute resp faiure with hypoxia and hyperglycemia with DM2.  Had positive covid testing on 07/04/19. Admitted 07/09/19, dc 07/10/19.  DM2- Blood sugars readings- 5/6 304 fasting, 5/7 315 fasting, 469 midday, evening 316, 5/8 274 fasting, 384 midday, 340 evening, 5/9 214 fasting, 289 midday, 288 evening, 5/10 238 fasting, 260 midday, 424 evening, 5/11 244 fasting, 364 midday, 311 evening, 5/12 fasting 248.   -In addition- pt was given dexmethasone 6mg  tablet for 3 days for covid illness. Last tablet was 4 days ago.  Pt stating was dc on metformin1000mg  bid, but having severe diarrhea, and unable to tolerate that dose, went back to metformin 500mg  bid.  Also taking trulicity weekly, glipizide 10mg  xr daily, and was started on levemir flex pen 12 units bid.  HTN- pt had some elevated numbers while in hospital around 138/107, [place] at home 107-124/96-97.  Not on a medication for blood pressure.  covid illness- improving.  Having cough and sob when doing too much moving around. O2 has been running between 98 -100.  Taking cough syrup and vit c and zinc.    Virtual Visit via Telephone Note  I connected with Melanie Mccoy on 07/18/19 at  9:00 AM EDT by telephone and verified that I am speaking with the correct person using two identifiers.  Location: Patient: home Provider: office   I discussed the limitations, risks, security and privacy concerns of performing an evaluation and management service by telephone and the availability of in person appointments. I also discussed with the patient that there may be a patient responsible charge related to this service. The patient expressed understanding and  agreed to proceed.   Medical History Melanie Mccoy has a past medical history of Anemia, Bartholin cyst, Chlamydia (01/2011), Diabetes mellitus without complication ([place]), Family history of anesthesia complication, and No pertinent past medical history.   Outpatient Encounter Medications as of 07/17/2019  Medication Sig  . ascorbic acid (VITAMIN C) 500 MG tablet Take 1 tablet (500 mg total) by mouth daily for 10 days.  . Dulaglutide (TRULICITY) 3 ER/1.5QM SOPN Inject 3 mg into the skin once a week.  Marland Kitchen glipiZIDE (GLIPIZIDE XL) 10 MG 24 hr tablet Take one tablet by mouth every morning  . glucose blood test strip Use as instructed  . guaiFENesin-dextromethorphan (ROBITUSSIN DM) 100-10 MG/5ML syrup Take 10 mLs by mouth every 4 (four) hours as needed for cough.  . insulin detemir (LEVEMIR FLEXTOUCH) 100 UNIT/ML FlexPen Inject 14 units bid  . Lancets (ACCU-CHEK SOFT TOUCH) lancets Use as instructed  . metFORMIN (GLUCOPHAGE) 500 MG tablet Take 1 tablet (500 mg total) by mouth 2 (two) times daily with a meal.  . VITAMIN D PO Take by mouth.  . zinc sulfate 220 (50 Zn) MG capsule Take 1 capsule (220 mg total) by mouth daily for 10 days.  . [DISCONTINUED] insulin detemir (LEVEMIR FLEXTOUCH) 100 UNIT/ML FlexPen Inject 12 units bid  . [DISCONTINUED] Ipratropium-Albuterol (COMBIVENT) 20-100 MCG/ACT AERS respimat Inhale 1 puff into the lungs every 6 (six) hours as needed for wheezing.  . [DISCONTINUED] metFORMIN (GLUCOPHAGE) 500 MG tablet Take 1 tablet (500 mg total) by mouth 2 (two) times daily  with a meal.  . lisinopril (ZESTRIL) 5 MG tablet Take 1 tablet (5 mg total) by mouth daily.   No facility-administered encounter medications on file as of 07/17/2019.     Review of Systems  Constitutional: Negative for chills and fever.  HENT: Negative for congestion, rhinorrhea and sore throat.   Respiratory: Positive for cough (improving) and shortness of breath (improving). Negative for wheezing.   Cardiovascular:  Negative for chest pain and leg swelling.  Gastrointestinal: Negative for abdominal pain, diarrhea, nausea and vomiting.  Genitourinary: Negative for dysuria and frequency.  Musculoskeletal: Negative for arthralgias and back pain.  Skin: Negative for rash.  Neurological: Negative for dizziness, weakness and headaches.     Vitals BP 113/86 Comment: per pt  Ht 5\' 4"  (1.626 m)   Wt 201 lb (91.2 kg) Comment: per pt  LMP 07/07/2019   BMI 34.50 kg/m   Objective:   Physical Exam  No PE due to phone visit.  Assessment and Plan   1. Hospital discharge follow-up  2. COVID-19 virus infection  3. Acute respiratory failure with hypoxia (HCC)  4. Type 2 diabetes mellitus without complication, without long-term current use of insulin (HCC)  5. Essential hypertension   Follow up hosp discharge- pt improving.   Still having some mild coughing, but getting better.  Having elevated BG still and just finished dexmethasone for her covid illness.  Pt working on her diet and reviewed this and just started her levemir since being dc from hospital.  covid 19 infection and acute hypoxia- improved.  Continuing to take vitamins and robitussin DM.  Dm2- uncontrolled.  Last A1c on 07/09/19- 11.0. -advising to increase levemir from 12 units to 14 units bid.  Continue trulicity weekly, glipizide, and metformin at 500mg  bid and once tolerating will need to try to go up to 1000mg  metformin bid.  Discussed diet modifications. -checking bg 3x per day and calling if seeing numbers over 350.  -HTN- starting 5mg  lisinopril.  Check bp 2x per day and call if seeing low numbers or high numbers.  F/u 2 wks for phone visit to recheck numbers.  Greater than 30 mins spent on counseling and reviewing chart, hosp dc, labs, and discussion of diabetic changes/meds/diet.    Follow Up Instructions:    I discussed the assessment and treatment plan with the patient. The patient was provided an opportunity to ask  questions and all were answered. The patient agreed with the plan and demonstrated an understanding of the instructions.   The patient was advised to call back or seek an in-person evaluation if the symptoms worsen or if the condition fails to improve as anticipated.  I provided 30 minutes of non-face-to-face time during this encounter.

## 2019-07-17 NOTE — Telephone Encounter (Signed)
Ms. kahlie, deutscher are scheduled for a virtual visit with your provider today.    Just as we do with appointments in the office, we must obtain your consent to participate.  Your consent will be active for this visit and any virtual visit you may have with one of our providers in the next 365 days.    If you have a MyChart account, I can also send a copy of this consent to you electronically.  All virtual visits are billed to your insurance company just like a traditional visit in the office.  As this is a virtual visit, video technology does not allow for your provider to perform a traditional examination.  This may limit your provider's ability to fully assess your condition.  If your provider identifies any concerns that need to be evaluated in person or the need to arrange testing such as labs, EKG, etc, we will make arrangements to do so.    Although advances in technology are sophisticated, we cannot ensure that it will always work on either your end or our end.  If the connection with a video visit is poor, we may have to switch to a telephone visit.  With either a video or telephone visit, we are not always able to ensure that we have a secure connection.   I need to obtain your verbal consent now.   Are you willing to proceed with your visit today?   Riane Rung has provided verbal consent on 07/17/2019 for a virtual visit (video or telephone).   Kyra Manges, LPN 03/12/2692  8:54 AM

## 2019-07-19 ENCOUNTER — Other Ambulatory Visit: Payer: Self-pay

## 2019-07-19 ENCOUNTER — Ambulatory Visit
Admission: EM | Admit: 2019-07-19 | Discharge: 2019-07-19 | Disposition: A | Discharge status: Home / Self Care | Payer: 59 | Attending: Emergency Medicine | Admitting: Emergency Medicine

## 2019-07-19 ENCOUNTER — Telehealth: Payer: Self-pay | Admitting: Family Medicine

## 2019-07-19 DIAGNOSIS — K611 Rectal abscess: Secondary | ICD-10-CM

## 2019-07-19 MED ORDER — TRAMADOL HCL 50 MG PO TABS: 50.0000 mg | ORAL_TABLET | Freq: Two times a day (BID) | ORAL | 0 refills | Status: DC | PRN

## 2019-07-19 MED ORDER — AMOXICILLIN-POT CLAVULANATE 875-125 MG PO TABS: 1.0000 | ORAL_TABLET | Freq: Two times a day (BID) | ORAL | 0 refills | Status: AC

## 2019-07-19 NOTE — ED Provider Notes (Signed)
[facility]   [id number] 07/19/19 Arrival Time: 1650   GE:[id number]  SUBJECTIVE:  Melanie Mccoy is a 29 y.o. female who presents with a possible abscess of her LT buttock x 1 day.  Denies precipitating event or trauma.  Has tried popping at home without relief.  Reports previous symptoms in the past and had it lanced.  Denies fever, chills, nausea, vomiting, redness.    ROS: As per HPI.  All other pertinent ROS negative.     Past Medical History:  Diagnosis Date  . Anemia    slightly anemic, iron supplements every 2-3 days  . Bartholin cyst   . Chlamydia 01/2011  . Diabetes mellitus without complication (HCC)   . Family history of anesthesia complication    grandmother had spinal with nerve damage  . No pertinent past medical history    Past Surgical History:  Procedure Laterality Date  . CESAREAN SECTION N/A 11/22/2012   Procedure: PRIMARY CESAREAN SECTION;  Surgeon: Esmeralda Arthur, MD;  Location: [facility];  Service: Obstetrics;  Laterality: N/A;  . CYST REMOVAL TRUNK  2005   Allergies  Allergen Reactions  . Bee Venom Swelling    Progressively worse with each sting  . Ibuprofen Hives  . Nickel Rash   No current facility-administered medications on file prior to encounter.   Current Outpatient Medications on File Prior to Encounter  Medication Sig Dispense Refill  . ascorbic acid (VITAMIN C) 500 MG tablet Take 1 tablet (500 mg total) by mouth daily for 10 days. 10 tablet 0  . Dulaglutide (TRULICITY) 3 MG/0.5ML SOPN Inject 3 mg into the skin once a week. 2 mL 5  . glipiZIDE (GLIPIZIDE XL) 10 MG 24 hr tablet Take one tablet by mouth every morning 30 tablet 5  . glucose blood test strip Use as instructed 50 each 12  . guaiFENesin-dextromethorphan (ROBITUSSIN DM) 100-10 MG/5ML syrup Take 10 mLs by mouth every 4 (four) hours as needed for cough. 118 mL 0  . insulin detemir (LEVEMIR FLEXTOUCH) 100 UNIT/ML FlexPen Inject 14 units bid 15 mL 2  . Lancets (ACCU-CHEK  SOFT TOUCH) lancets Use as instructed 50 each 12  . lisinopril (ZESTRIL) 5 MG tablet Take 1 tablet (5 mg total) by mouth daily. 90 tablet 1  . metFORMIN (GLUCOPHAGE) 500 MG tablet Take 1 tablet (500 mg total) by mouth 2 (two) times daily with a meal. 60 tablet 3  . VITAMIN D PO Take by mouth.    . zinc sulfate 220 (50 Zn) MG capsule Take 1 capsule (220 mg total) by mouth daily for 10 days. 10 capsule 0   Social History   Socioeconomic History  . Marital status: Single    Spouse name: Not on file  . Number of children: Not on file  . Years of education: Not on file  . Highest education level: Not on file  Occupational History  . Not on file  Tobacco Use  . Smoking status: Never Smoker  . Smokeless tobacco: Never Used  Substance and Sexual Activity  . Alcohol use: No    Alcohol/week: 0.0 standard drinks  . Drug use: No  . Sexual activity: Yes  Other Topics Concern  . Not on file  Social History Narrative  . Not on file   Social Determinants of Health   Financial Resource Strain:   . Difficulty of Paying Living Expenses:   Food Insecurity:   . Worried About Programme researcher, broadcasting/film/video in the Last Year:   .  Ran Out of Food in the Last Year:   Transportation Needs:   . Film/video editor (Medical):   Marland Kitchen Lack of Transportation (Non-Medical):   Physical Activity:   . Days of Exercise per Week:   . Minutes of Exercise per Session:   Stress:   . Feeling of Stress :   Social Connections:   . Frequency of Communication with Friends and Family:   . Frequency of Social Gatherings with Friends and Family:   . Attends Religious Services:   . Active Member of Clubs or Organizations:   . Attends Archivist Meetings:   Marland Kitchen Marital Status:   Intimate Partner Violence:   . Fear of Current or Ex-Partner:   . Emotionally Abused:   Marland Kitchen Physically Abused:   . Sexually Abused:    Family History  Problem Relation Age of Onset  . Cancer Other   . Heart disease Other   . Diabetes  Other   . Diabetes Mother   . Diabetes Father   . Diabetes Maternal Grandfather     OBJECTIVE:  Vitals:   07/19/19 1656  BP: (!) 152/91  Pulse: (!) 107  Resp: 16  Temp: (!) 97.5 F (36.4 C)  SpO2: 97%     General appearance: alert; no distress Skin: 5-6 x 2cm induration of her LT inferior gluteal cleft; tender to touch; no active drainage Psychological: alert and cooperative; normal mood and affect  Procedure: Verbal consent obtained. Area over induration cleaned with betadine. Lidocaine 2% with epinephrine used to obtain local anesthesia. The most fluctuant portion of the abscess was incised with a #11 blade scalpel. Abscess cavity explored and evacuated. Loculations broken up with a curved hemostat as best as possible given patient discomfort. Cavity packed with packing material and dressed with a clean gauze dressing. Minimal bleeding. No complications.  ASSESSMENT & PLAN:  1. Perirectal abscess     Meds ordered this encounter  Medications  . amoxicillin-clavulanate (AUGMENTIN) 875-125 MG tablet    Sig: Take 1 tablet by mouth every 12 (twelve) hours for 10 days.    Dispense:  20 tablet    Refill:  0    Order Specific Question:   Supervising Provider    Answer:   Raylene Everts [[id number]]  . traMADol (ULTRAM) 50 MG tablet    Sig: Take 1 tablet (50 mg total) by mouth every 12 (twelve) hours as needed.    Dispense:  8 tablet    Refill:  0    Order Specific Question:   Supervising Provider    Answer:   Raylene Everts [[id number]]    Keep dry and covered for next 24-48 hours Remove packing in 48 hours either at home or return here After packing is removed in you may then begin appling warm compresses 3-4x daily for 10-15 minutes.  You may then wash site daily with warm water and mild soap Keep covered to avoid friction Take antibiotic as prescribed and to completion Return sooner or go to the ED if you have any new or worsening symptoms such as increased redness,  swelling, pain, nausea, vomiting, fever, chills, etc...   Reviewed expectations re: course of current medical issues. Questions answered. Outlined signs and symptoms indicating need for more acute intervention. Patient verbalized understanding. After Visit Summary given.          Lestine Box, PA-C 07/19/19 1815

## 2019-07-19 NOTE — Telephone Encounter (Signed)
Take just a half of a tab and call us next week with numbers

## 2019-07-19 NOTE — Telephone Encounter (Signed)
Per Dr Brett Canales- see phone message 07/19/19- Take just a half of a tab and call us next week with numbers Patient advised to go to urgent care regarding the abscess

## 2019-07-19 NOTE — ED Triage Notes (Signed)
Pt c/o abscess to L buttocks, states has been draining since yesterday. Denies fevers.

## 2019-07-19 NOTE — Discharge Instructions (Signed)
Keep dry and covered for next 24-48 hours Remove packing in 48 hours either at home or return here After packing is removed in you may then begin appling warm compresses 3-4x daily for 10-15 minutes.  You may then wash site daily with warm water and mild soap Keep covered to avoid friction Take antibiotic as prescribed and to completion Return sooner or go to the ED if you have any new or worsening symptoms such as increased redness, swelling, pain, nausea, vomiting, fever, chills, etc...  

## 2019-07-19 NOTE — Telephone Encounter (Signed)
Patient has some concern about her blood pressure.She had video visit on 5/12. Her pressure yesterday was 99/62 and 101/65.This morning at 4:30 am -120/87 and 124/92 at 10:20 am. She is wondering if she needs to continue taking her blood pressure medication or does it need adjusting.

## 2019-07-19 NOTE — Telephone Encounter (Signed)
Patient also sent my chart message with blood pressure readings and patient was advised of Dr Soyla Dryer recommendations via my chart.

## 2019-07-21 ENCOUNTER — Ambulatory Visit
Admission: EM | Admit: 2019-07-21 | Discharge: 2019-07-21 | Disposition: A | Discharge status: Home / Self Care | Payer: 59

## 2019-07-21 ENCOUNTER — Other Ambulatory Visit: Payer: Self-pay

## 2019-07-21 NOTE — ED Triage Notes (Signed)
Pt here for packing removal.

## 2019-07-21 NOTE — ED Notes (Signed)
Packing removed, wound is WNL

## 2019-08-01 ENCOUNTER — Telehealth (INDEPENDENT_AMBULATORY_CARE_PROVIDER_SITE_OTHER): Payer: 59 | Admitting: Family Medicine

## 2019-08-01 ENCOUNTER — Other Ambulatory Visit: Payer: Self-pay

## 2019-08-01 ENCOUNTER — Telehealth: Payer: Self-pay | Admitting: Family Medicine

## 2019-08-01 ENCOUNTER — Encounter: Payer: Self-pay | Admitting: Family Medicine

## 2019-08-01 DIAGNOSIS — E119 Type 2 diabetes mellitus without complications: Secondary | ICD-10-CM

## 2019-08-01 DIAGNOSIS — Z79899 Other long term (current) drug therapy: Secondary | ICD-10-CM

## 2019-08-01 DIAGNOSIS — L0291 Cutaneous abscess, unspecified: Secondary | ICD-10-CM

## 2019-08-01 DIAGNOSIS — I10 Essential (primary) hypertension: Secondary | ICD-10-CM

## 2019-08-01 DIAGNOSIS — E1165 Type 2 diabetes mellitus with hyperglycemia: Secondary | ICD-10-CM | POA: Diagnosis not present

## 2019-08-01 DIAGNOSIS — E1169 Type 2 diabetes mellitus with other specified complication: Secondary | ICD-10-CM

## 2019-08-01 MED ORDER — LEVEMIR FLEXTOUCH 100 UNIT/ML ~~LOC~~ SOPN: PEN_INJECTOR | SUBCUTANEOUS | 2 refills | Status: DC

## 2019-08-01 NOTE — Progress Notes (Signed)
Patient ID: Melanie Mccoy, female    DOB: 1990-04-29, 29 y.o.   MRN: [id number]  Virtual Visit via Telephone Note  I connected with [name] on 08/01/19 at  2:00 PM EDT by telephone and verified that I am speaking with the correct person using two identifiers.  Location: Patient: home Provider: office   I discussed the limitations, risks, security and privacy concerns of performing an evaluation and management service by telephone and the availability of in person appointments. I also discussed with the patient that there may be a patient responsible charge related to this service. The patient expressed understanding and agreed to proceed.    Chief Complaint  Patient presents with  . Diabetes    follow up   Subjective:    HPI Had a phone visit with pt. Pt stating she is feeling better after discharge from hospital with covid and uncontrolled DM2. Pt is now using a medicine folder to organize her meds and eating less carbs.  Not having increased thirst or urination.  She did have a recent skin infection. bp was getting a little lower on bottom number.  Stopped taking the bp meds. Restarted bp med after antibiotics.  On abx for abscess. Augmentin and finished it. Had 10 days of medication. The urgent care did try to I &D and packed it. BG- ranging 157-295.  7/16 numbers are btw 157-200.  BP- was having some low 99/62 and 105/74, around 5/12-5/13, stopped the lisinopril on those days due to feeling dizzy.  Then her bp started going up systolic ranging 725-366Y and diastolic- 90- 117.  Last few days her diastolic has been over 100-120.   Pt stating she is taking the lisinopril now.    Medical History Ira has a past medical history of Anemia, Bartholin cyst, Chlamydia (01/2011), Diabetes mellitus without complication (HCC), Family history of anesthesia complication, and No pertinent past medical history.   Outpatient Encounter Medications as of 08/01/2019    Medication Sig  . Dulaglutide (TRULICITY) 3 MG/0.5ML SOPN Inject 3 mg into the skin once a week.  Marland Kitchen glipiZIDE (GLIPIZIDE XL) 10 MG 24 hr tablet Take one tablet by mouth every morning  . glucose blood test strip Use as instructed  . guaiFENesin-dextromethorphan (ROBITUSSIN DM) 100-10 MG/5ML syrup Take 10 mLs by mouth every 4 (four) hours as needed for cough.  . insulin detemir (LEVEMIR FLEXTOUCH) 100 UNIT/ML FlexPen Inject 16 units bid  . Lancets (ACCU-CHEK SOFT TOUCH) lancets Use as instructed  . lisinopril (ZESTRIL) 5 MG tablet Take 1 tablet (5 mg total) by mouth daily.  . metFORMIN (GLUCOPHAGE) 500 MG tablet Take 1 tablet (500 mg total) by mouth 2 (two) times daily with a meal.  . traMADol (ULTRAM) 50 MG tablet Take 1 tablet (50 mg total) by mouth every 12 (twelve) hours as needed.  Marland Kitchen VITAMIN D PO Take by mouth.  . [DISCONTINUED] insulin detemir (LEVEMIR FLEXTOUCH) 100 UNIT/ML FlexPen Inject 14 units bid   No facility-administered encounter medications on file as of 08/01/2019.     Review of Systems  Constitutional: Negative for chills and fever.  Cardiovascular: Negative for chest pain.  Gastrointestinal: Negative for abdominal pain, diarrhea and vomiting.  Endocrine: Negative for polydipsia, polyphagia and polyuria.  Skin: Negative for rash and wound.  Neurological: Negative for dizziness, weakness and headaches.    Vitals LMP 07/07/2019   Objective:   Physical Exam  No PE due to phone visit.  Assessment and Plan  1. Type 2 diabetes mellitus with hyperglycemia, unspecified whether long term insulin use ([place])  2. Essential hypertension  3. Abscess  - resolved. Finished augmentin for 10 days.  DM2-Changed levemir to 16 units bid.  Cont to monitor bg and watch carb intake. DM2- improving, however suboptimal.      Cont trulicity, glipizide, metformin, levemir.   htn- Continue to take lisinopril 5mg  at night before bed to avoid dizziness. Check bp over the next  week, if still seeing numbers diastolic over 90 to call office.  F/u in 1-2 months for recheck of DM and htn. Pt in aggrement with plan.   Follow Up Instructions:    I discussed the assessment and treatment plan with the patient. The patient was provided an opportunity to ask questions and all were answered. The patient agreed with the plan and demonstrated an understanding of the instructions.   The patient was advised to call back or seek an in-person evaluation if the symptoms worsen or if the condition fails to improve as anticipated.  I provided 16 minutes of non-face-to-face time during this encounter.

## 2019-08-01 NOTE — Telephone Encounter (Signed)
(  Message For Dr. Ladona Ridgel) patient needing labs for August follow up appointment.

## 2019-08-01 NOTE — Telephone Encounter (Signed)
Last labs 03/11/19 lipid, liver, bmp, a1c, mircoalb urine

## 2019-08-02 ENCOUNTER — Telehealth: Payer: 59 | Admitting: Family Medicine

## 2019-08-02 NOTE — Telephone Encounter (Signed)
Pt notified Orders put in and mailed to pt to do before visit in august

## 2019-08-02 NOTE — Telephone Encounter (Signed)
Yes, pls order those labs. Thx. Dr. Attie Nawabi ° °

## 2019-08-08 ENCOUNTER — Other Ambulatory Visit: Payer: Self-pay | Admitting: Family Medicine

## 2019-08-08 NOTE — Telephone Encounter (Signed)
Seen for diabetes 08/01/19

## 2019-09-04 ENCOUNTER — Other Ambulatory Visit: Payer: Self-pay | Admitting: Family Medicine

## 2019-09-18 ENCOUNTER — Other Ambulatory Visit: Payer: Self-pay

## 2019-09-18 ENCOUNTER — Telehealth (INDEPENDENT_AMBULATORY_CARE_PROVIDER_SITE_OTHER): Payer: 59 | Admitting: Family Medicine

## 2019-09-18 ENCOUNTER — Encounter: Payer: Self-pay | Admitting: Family Medicine

## 2019-09-18 VITALS — BP 134/101 | Ht 63.5 in | Wt 205.0 lb

## 2019-09-18 DIAGNOSIS — E119 Type 2 diabetes mellitus without complications: Secondary | ICD-10-CM

## 2019-09-18 DIAGNOSIS — I10 Essential (primary) hypertension: Secondary | ICD-10-CM | POA: Diagnosis not present

## 2019-09-18 MED ORDER — LEVEMIR FLEXTOUCH 100 UNIT/ML ~~LOC~~ SOPN: PEN_INJECTOR | SUBCUTANEOUS | 2 refills | Status: DC

## 2019-09-18 MED ORDER — LISINOPRIL 10 MG PO TABS: 10.0000 mg | ORAL_TABLET | Freq: Every day | ORAL | 3 refills | Status: DC

## 2019-09-18 NOTE — Progress Notes (Signed)
Patient ID: Melanie Mccoy, female    DOB: 1991/01/01, 29 y.o.   MRN: [id number]  Virtual Visit via Telephone Note  I connected with Melanie Mccoy on 09/18/19 at  3:00 PM EDT by telephone and verified that I am speaking with the correct person using two identifiers.  Location: Patient: home Provider: office   I discussed the limitations, risks, security and privacy concerns of performing an evaluation and management service by telephone and the availability of in person appointments. I also discussed with the patient that there may be a patient responsible charge related to this service. The patient expressed understanding and agreed to proceed.  Chief Complaint  Patient presents with  . Diabetes   Subjective:    HPI   F/u DM2- med check up.  Pt states 20 out of 47 blood sugar readings have been over 200, (211-230). Has seen one at 122.  thurs-sat are higher numbers.  Pt unsure if not eating as well at end of week and eating better at beginning of week.   Takes meds every day. Exercises 30 mins every day. Going to have eye exam today. Has not had a diabetic foot exam.   Htn- bp today 134/101, doing bp cuff at home. Ranging- 116/75 lowest, most of time diastolic 90-101.  121-130s systolic. Doing well with lisinopril and not having any side effects.    If eating a heavy carb meal, will get some diarrhea with the metformin.  Had a hard time with 1000mg  bid due to GI upset.  Normally not upsetting stomach.    10/28/19- has next appt.  Needing vs checked before next visit.    Medical History Melanie Mccoy has a past medical history of Anemia, Bartholin cyst, Chlamydia (01/2011), Diabetes mellitus without complication (HCC), Family history of anesthesia complication, and No pertinent past medical history.   Outpatient Encounter Medications as of 09/18/2019  Medication Sig  . BD PEN NEEDLE NANO 2ND GEN 32G X 4 MM MISC USE ONCE DAILY  . Dulaglutide (TRULICITY) 3 MG/0.5ML SOPN  Inject 3 mg into the skin once a week.  09/20/2019 glipiZIDE (GLUCOTROL XL) 10 MG 24 hr tablet TAKE 1 TABLET BY MOUTH EVERY MORNING  . glucose blood test strip Use as instructed  . insulin detemir (LEVEMIR FLEXTOUCH) 100 UNIT/ML FlexPen Inject 18 units bid  . Lancets (ACCU-CHEK SOFT TOUCH) lancets Use as instructed  . lisinopril (ZESTRIL) 10 MG tablet Take 1 tablet (10 mg total) by mouth daily.  . metFORMIN (GLUCOPHAGE) 500 MG tablet Take 1 tablet (500 mg total) by mouth 2 (two) times daily with a meal.  . VITAMIN D PO Take by mouth.  . [DISCONTINUED] insulin detemir (LEVEMIR FLEXTOUCH) 100 UNIT/ML FlexPen Inject 16 units bid  . [DISCONTINUED] lisinopril (ZESTRIL) 5 MG tablet Take 1 tablet (5 mg total) by mouth daily.  . [DISCONTINUED] traMADol (ULTRAM) 50 MG tablet Take 1 tablet (50 mg total) by mouth every 12 (twelve) hours as needed.  . [DISCONTINUED] guaiFENesin-dextromethorphan (ROBITUSSIN DM) 100-10 MG/5ML syrup Take 10 mLs by mouth every 4 (four) hours as needed for cough.   No facility-administered encounter medications on file as of 09/18/2019.     Review of Systems  Constitutional: Negative for chills and fever.  HENT: Negative for congestion, rhinorrhea and sore throat.   Respiratory: Negative for cough, shortness of breath and wheezing.   Cardiovascular: Negative for chest pain and leg swelling.  Gastrointestinal: Negative for abdominal pain, diarrhea, nausea and vomiting.  Endocrine: Negative  for polydipsia, polyphagia and polyuria.  Genitourinary: Negative for dysuria and frequency.  Musculoskeletal: Negative for arthralgias and back pain.  Skin: Negative for rash.  Neurological: Negative for dizziness, weakness and headaches.     Vitals BP (!) 134/101   Ht 5' 3.5" (1.613 m)   Wt 205 lb (93 kg)   BMI 35.74 kg/m   Objective:   Physical Exam  - no PE due to phone visit.  Assessment and Plan   1. Type 2 diabetes mellitus without complication, without long-term current  use of insulin (HCC) - insulin detemir (LEVEMIR FLEXTOUCH) 100 UNIT/ML FlexPen; Inject 18 units bid  Dispense: 15 mL; Refill: 2  2. Essential hypertension - lisinopril (ZESTRIL) 10 MG tablet; Take 1 tablet (10 mg total) by mouth daily.  Dispense: 30 tablet; Refill: 3   htn- uncontrolled, will inc lisinopril from 5mg  to 10mg . Cont to check daily and call if seeing numbers under 90/60 or over 140/90.  DM2- uncontrolled, but improving.  Will inc levemir to 18 units bid.  Pt wanting to keep oral meds the same, since had lots of gi upset on the higher dose of metfromin.  Cont with trulicity, metformin, glipizide, and levemir. Call if seeing numbers under 80 or over 200s.   F/u in august as scheduled.  Follow Up Instructions:    I discussed the assessment and treatment plan with the patient. The patient was provided an opportunity to ask questions and all were answered. The patient agreed with the plan and demonstrated an understanding of the instructions.   The patient was advised to call back or seek an in-person evaluation if the symptoms worsen or if the condition fails to improve as anticipated.  I provided 15 minutes of non-face-to-face time during this encounter.

## 2019-09-24 MED ORDER — TRULICITY 3 MG/0.5ML ~~LOC~~ SOAJ: 3.0000 mg | SUBCUTANEOUS | 5 refills | Status: DC

## 2019-09-24 NOTE — Telephone Encounter (Signed)
Pharm does not need PA just auth to refill med. She has one box left and it has 4 pens per [facility]

## 2019-09-24 NOTE — Telephone Encounter (Signed)
Pt.notified

## 2019-10-25 LAB — HEPATIC FUNCTION PANEL
ALT: 22 IU/L (ref 0–32)
AST: 17 IU/L (ref 0–40)
Albumin: 4.3 g/dL (ref 3.9–5.0)
Alkaline Phosphatase: 68 IU/L (ref 48–121)
Bilirubin Total: <0.2 mg/dL (ref 0.0–1.2)
Bilirubin, Direct: 0.08 mg/dL (ref 0.00–0.40)
Total Protein: 6.6 g/dL (ref 6.0–8.5)

## 2019-10-25 LAB — BASIC METABOLIC PANEL
BUN/Creatinine Ratio: 20 (ref 9–23)
BUN: 14 mg/dL (ref 6–20)
CO2: 21 mmol/L (ref 20–29)
Calcium: 9.6 mg/dL (ref 8.7–10.2)
Chloride: 101 mmol/L (ref 96–106)
Creatinine, Ser: 0.69 mg/dL (ref 0.57–1.00)
GFR calc Af Amer: 138 mL/min/{1.73_m2} (ref >59)
GFR calc non Af Amer: 120 mL/min/{1.73_m2} (ref >59)
Glucose: 204 mg/dL — ABNORMAL HIGH (ref 65–99)
Potassium: 4.4 mmol/L (ref 3.5–5.2)
Sodium: 139 mmol/L (ref 134–144)

## 2019-10-25 LAB — MICROALBUMIN / CREATININE URINE RATIO
Creatinine, Urine: 83.1 mg/dL
Microalb/Creat Ratio: 33 mg/g creat — ABNORMAL HIGH (ref 0–29)
Microalbumin, Urine: 27.7 ug/mL

## 2019-10-25 LAB — LIPID PANEL
Chol/HDL Ratio: 5.8 ratio — ABNORMAL HIGH (ref 0.0–4.4)
Cholesterol, Total: 208 mg/dL — ABNORMAL HIGH (ref 100–199)
HDL: 36 mg/dL — ABNORMAL LOW (ref >39)
LDL Chol Calc (NIH): 112 mg/dL — ABNORMAL HIGH (ref 0–99)
Triglycerides: 348 mg/dL — ABNORMAL HIGH (ref 0–149)
VLDL Cholesterol Cal: 60 mg/dL — ABNORMAL HIGH (ref 5–40)

## 2019-10-25 LAB — HEMOGLOBIN A1C
Est. average glucose Bld gHb Est-mCnc: 246 mg/dL
Hgb A1c MFr Bld: 10.2 % — ABNORMAL HIGH (ref 4.8–5.6)

## 2019-10-28 ENCOUNTER — Encounter: Payer: Self-pay | Admitting: Family Medicine

## 2019-10-28 ENCOUNTER — Telehealth (INDEPENDENT_AMBULATORY_CARE_PROVIDER_SITE_OTHER): Payer: 59 | Admitting: Family Medicine

## 2019-10-28 DIAGNOSIS — E1169 Type 2 diabetes mellitus with other specified complication: Secondary | ICD-10-CM

## 2019-10-28 DIAGNOSIS — I10 Essential (primary) hypertension: Secondary | ICD-10-CM

## 2019-10-28 DIAGNOSIS — E785 Hyperlipidemia, unspecified: Secondary | ICD-10-CM

## 2019-10-28 DIAGNOSIS — E119 Type 2 diabetes mellitus without complications: Secondary | ICD-10-CM | POA: Diagnosis not present

## 2019-10-28 MED ORDER — AMLODIPINE BESYLATE 5 MG PO TABS
5.0000 mg | ORAL_TABLET | Freq: Every morning | ORAL | 1 refills | Status: DC
Start: 2019-10-28 — End: 2020-01-29

## 2019-10-28 MED ORDER — LEVEMIR FLEXTOUCH 100 UNIT/ML ~~LOC~~ SOPN: PEN_INJECTOR | SUBCUTANEOUS | 3 refills | Status: DC

## 2019-10-28 MED ORDER — METFORMIN HCL 500 MG PO TABS: 500.0000 mg | ORAL_TABLET | Freq: Two times a day (BID) | ORAL | 5 refills | Status: DC

## 2019-10-28 NOTE — Progress Notes (Signed)
Patient ID: Melanie Mccoy, female    DOB: Jan 16, 1991, 29 y.o.   MRN: [id number]  Virtual Visit via Video Note  I connected with Melanie Mccoy on 10/28/19 at 11:30 AM EDT by a video enabled telemedicine application and verified that I am speaking with the correct person using two identifiers.  Location: Patient: home Provider: office   I discussed the limitations of evaluation and management by telemedicine and the availability of in person appointments. The patient expressed understanding and agreed to proceed.   Chief Complaint  Patient presents with  . Diabetes  . Hypertension   Subjective:    HPI Pt having phone visit for follow up on DM and HTN. Pt states that her blood pressure is "all over the place". AM readings have been higher than night readings. Pt checks BP BID.  Pt sugars have also been "all over the place'. Pt has been trying to get fasting sugars under 200 but they keep creeping up. Pt check sugars BID; one fasting and one at night. Pt is going to be sending a spreadsheet of reading through MyChart.   Pt had to have phone visit instead of in person due to having close contact with positive covid case in the last 2 days.  Labs- Glucose 204 Cholesterol- 208 TG- 348 LDL- 112.  Feeling when more stressed, eating badly and thinking it's affecting her glucose.  BG- has about 15 readings over 200. lowest was 119. In pm some are in 300s. Mostly around 250s.  bp- high diastolic still at 90-100s. Systolic 120-130s.    Medical History Melanie Mccoy has a past medical history of Anemia, Bartholin cyst, Chlamydia (01/2011), Diabetes mellitus without complication (HCC), Family history of anesthesia complication, and No pertinent past medical history.   Outpatient Encounter Medications as of 10/28/2019  Medication Sig  . BD PEN NEEDLE NANO 2ND GEN 32G X 4 MM MISC USE ONCE DAILY  . Dulaglutide (TRULICITY) 3 MG/0.5ML SOPN Inject 0.5 mLs (3 mg total) into the  skin once a week.  Marland Kitchen glipiZIDE (GLUCOTROL XL) 10 MG 24 hr tablet TAKE 1 TABLET BY MOUTH EVERY MORNING  . glucose blood test strip Use as instructed  . insulin detemir (LEVEMIR FLEXTOUCH) 100 UNIT/ML FlexPen Inject 22 units bid  . Lancets (ACCU-CHEK SOFT TOUCH) lancets Use as instructed  . lisinopril (ZESTRIL) 10 MG tablet Take 1 tablet (10 mg total) by mouth daily.  . metFORMIN (GLUCOPHAGE) 500 MG tablet Take 1 tablet (500 mg total) by mouth 2 (two) times daily with a meal.  . [DISCONTINUED] insulin detemir (LEVEMIR FLEXTOUCH) 100 UNIT/ML FlexPen Inject 18 units bid  . [DISCONTINUED] metFORMIN (GLUCOPHAGE) 500 MG tablet Take 1 tablet (500 mg total) by mouth 2 (two) times daily with a meal.  . [DISCONTINUED] VITAMIN D PO Take by mouth.  Marland Kitchen amLODipine (NORVASC) 5 MG tablet Take 1 tablet (5 mg total) by mouth in the morning.   No facility-administered encounter medications on file as of 10/28/2019.     Review of Systems  Constitutional: Negative for chills and fever.  HENT: Negative for congestion, rhinorrhea and sore throat.   Respiratory: Negative for cough, shortness of breath and wheezing.   Cardiovascular: Negative for chest pain and leg swelling.  Gastrointestinal: Negative for abdominal pain, diarrhea, nausea and vomiting.  Endocrine: Positive for polydipsia. Negative for polyphagia and polyuria.  Genitourinary: Negative for dysuria and frequency.  Musculoskeletal: Negative for arthralgias and back pain.  Skin: Negative for rash.  Neurological: Negative for dizziness, weakness and headaches.     Vitals There were no vitals taken for this visit.  Objective:   Physical Exam  No PE due to phone visit.  Assessment and Plan   1. Type 2 diabetes mellitus without complication, without long-term current use of insulin (HCC) - insulin detemir (LEVEMIR FLEXTOUCH) 100 UNIT/ML FlexPen; Inject 22 units bid  Dispense: 15 mL; Refill: 3  2. Essential hypertension  3. Hyperlipidemia  associated with type 2 diabetes mellitus (HCC)   Increased levemir from 18 units to 22 units bid. Cont to check bg 3x per day.   HTN- still elevated diastolic bp, will add norvasc 5mg  in AM and cont with lisinopril 10mg  at night.  HLD- cont to monitor and watch diet and increase in exercising.  Pt to check 2x per day and call if seeing numbers under 100 systolic or over .  F/u 25mo or prn.    Follow Up Instructions:    I discussed the assessment and treatment plan with the patient. The patient was provided an opportunity to ask questions and all were answered. The patient agreed with the plan and demonstrated an understanding of the instructions.   The patient was advised to call back or seek an in-person evaluation if the symptoms worsen or if the condition fails to improve as anticipated.  I provided 15 minutes of non-face-to-face time during this encounter.

## 2019-11-14 ENCOUNTER — Other Ambulatory Visit: Payer: Self-pay

## 2019-11-14 DIAGNOSIS — E119 Type 2 diabetes mellitus without complications: Secondary | ICD-10-CM

## 2019-11-14 MED ORDER — LEVEMIR FLEXTOUCH 100 UNIT/ML ~~LOC~~ SOPN: PEN_INJECTOR | SUBCUTANEOUS | 3 refills | Status: DC

## 2019-12-12 ENCOUNTER — Other Ambulatory Visit: Payer: Self-pay | Admitting: Family Medicine

## 2019-12-21 ENCOUNTER — Other Ambulatory Visit: Payer: Self-pay | Admitting: Family Medicine

## 2019-12-21 DIAGNOSIS — I10 Essential (primary) hypertension: Secondary | ICD-10-CM

## 2020-01-29 ENCOUNTER — Other Ambulatory Visit: Payer: Self-pay

## 2020-01-29 ENCOUNTER — Ambulatory Visit: Payer: Self-pay | Admitting: Family Medicine

## 2020-01-29 ENCOUNTER — Encounter: Payer: Self-pay | Admitting: Family Medicine

## 2020-01-29 VITALS — BP 132/84 | HR 89 | Temp 97.2°F | Ht 63.5 in | Wt 202.0 lb

## 2020-01-29 DIAGNOSIS — E1169 Type 2 diabetes mellitus with other specified complication: Secondary | ICD-10-CM

## 2020-01-29 DIAGNOSIS — E119 Type 2 diabetes mellitus without complications: Secondary | ICD-10-CM

## 2020-01-29 DIAGNOSIS — B360 Pityriasis versicolor: Secondary | ICD-10-CM

## 2020-01-29 DIAGNOSIS — I10 Essential (primary) hypertension: Secondary | ICD-10-CM

## 2020-01-29 DIAGNOSIS — E785 Hyperlipidemia, unspecified: Secondary | ICD-10-CM

## 2020-01-29 LAB — POCT GLUCOSE (DEVICE FOR HOME USE): Glucose Fasting, POC: 176 mg/dL — AB (ref 70–99)

## 2020-01-29 MED ORDER — METFORMIN HCL ER 500 MG PO TB24: 500.0000 mg | ORAL_TABLET | Freq: Two times a day (BID) | ORAL | 0 refills | Status: DC

## 2020-01-29 MED ORDER — LISINOPRIL 10 MG PO TABS: 10.0000 mg | ORAL_TABLET | Freq: Every day | ORAL | 1 refills | Status: DC

## 2020-01-29 MED ORDER — TRULICITY 3 MG/0.5ML ~~LOC~~ SOAJ: 3.0000 mg | SUBCUTANEOUS | 5 refills | Status: DC

## 2020-01-29 MED ORDER — KETOCONAZOLE 1 % EX SHAM: MEDICATED_SHAMPOO | CUTANEOUS | 1 refills | Status: DC

## 2020-01-29 MED ORDER — METFORMIN HCL 500 MG PO TABS: 500.0000 mg | ORAL_TABLET | Freq: Two times a day (BID) | ORAL | 5 refills | Status: DC

## 2020-01-29 MED ORDER — AMLODIPINE BESYLATE 5 MG PO TABS: 5.0000 mg | ORAL_TABLET | Freq: Every morning | ORAL | 1 refills | Status: DC

## 2020-01-29 MED ORDER — GLIPIZIDE ER 10 MG PO TB24: ORAL_TABLET | ORAL | 1 refills | Status: DC

## 2020-01-29 MED ORDER — BD PEN NEEDLE NANO 2ND GEN 32G X 4 MM MISC: 2 refills | Status: DC

## 2020-01-29 NOTE — Progress Notes (Addendum)
Patient ID: Melanie Mccoy, female    DOB: 1990/10/09, 29 y.o.   MRN: [id number]   Chief Complaint  Patient presents with  . Diabetes   Subjective:    HPI  F/u- diabetic check up. Pt states she has not been checking sugars. Doing moderate exercise. Pt states she has lost some weight. Has been following a diabetic diet.   Pt feeling more stressed out at her old work.  Has a new job and improved since new job. And bg are improved and weight loss since feeling less stress.  BG this AM 176.  Has been 170-190s in am, since nov 1.   Mostly on the left arm and small area on rt forearm. circluar light coloring to the forearms.  Also noticing darkening on the skin on bilateral flank areas.  Some itchiness. No fever, sores, or blisters.  No foot pain, or burning pain with the feet.  HTN Pt compliant with BP meds. Pt stating hasn't taken lisinopril today. No SEs Denies chest pain, sob, LE swelling, or blurry vision.  Medical History Melanie Mccoy has a past medical history of Anemia, Bartholin cyst, Chlamydia (01/2011), Diabetes mellitus without complication ([place]), Family history of anesthesia complication, and No pertinent past medical history.   Outpatient Encounter Medications as of 01/29/2020  Medication Sig  . amLODipine (NORVASC) 5 MG tablet Take 1 tablet (5 mg total) by mouth in the morning.  . Dulaglutide (TRULICITY) 3 SN/0.5LZ SOPN Inject 3 mg into the skin once a week.  Marland Kitchen glipiZIDE (GLUCOTROL XL) 10 MG 24 hr tablet TAKE 1 TABLET BY MOUTH EVERY MORNING  . glucose blood test strip Use as instructed  . insulin detemir (LEVEMIR FLEXTOUCH) 100 UNIT/ML FlexPen Inject 22 units bid  . Insulin Pen Needle (BD PEN NEEDLE NANO 2ND GEN) 32G X 4 MM MISC Use 2x per day as directed.  . Lancets (ACCU-CHEK SOFT TOUCH) lancets Use as instructed  . lisinopril (ZESTRIL) 10 MG tablet Take 1 tablet (10 mg total) by mouth daily.  . metFORMIN (GLUCOPHAGE) 500 MG tablet Take 1 tablet (500 mg total) by  mouth 2 (two) times daily with a meal.  . [DISCONTINUED] amLODipine (NORVASC) 5 MG tablet Take 1 tablet (5 mg total) by mouth in the morning.  . [DISCONTINUED] BD PEN NEEDLE NANO 2ND GEN 32G X 4 MM MISC USE ONCE DAILY  . [DISCONTINUED] Dulaglutide (TRULICITY) 3 JQ/7.3AL SOPN Inject 0.5 mLs (3 mg total) into the skin once a week.  . [DISCONTINUED] glipiZIDE (GLUCOTROL XL) 10 MG 24 hr tablet TAKE 1 TABLET BY MOUTH EVERY MORNING  . [DISCONTINUED] lisinopril (ZESTRIL) 10 MG tablet TAKE 1 TABLET(10 MG) BY MOUTH DAILY  . [DISCONTINUED] metFORMIN (GLUCOPHAGE) 500 MG tablet Take 1 tablet (500 mg total) by mouth 2 (two) times daily with a meal.  . atorvastatin (LIPITOR) 10 MG tablet Take 1 tablet (10 mg total) by mouth daily.  Marland Kitchen KETOCONAZOLE, TOPICAL, 1 % SHAM Apply to skin topically daily.  Lather and leave on skin for 5 mins for 2 wks on  rash, then use 2x per week for 4 more weeks.  . metFORMIN (GLUCOPHAGE XR) 500 MG 24 hr tablet Take 1 tablet (500 mg total) by mouth 2 (two) times daily before a meal.   No facility-administered encounter medications on file as of 01/29/2020.     Review of Systems  Constitutional: Negative for chills and fever.  HENT: Negative for congestion, rhinorrhea and sore throat.   Respiratory: Negative for cough, shortness  of breath and wheezing.   Cardiovascular: Negative for chest pain and leg swelling.  Gastrointestinal: Negative for abdominal pain, diarrhea, nausea and vomiting.  Genitourinary: Negative for dysuria and frequency.  Musculoskeletal: Negative for arthralgias and back pain.  Skin: Positive for rash.  Neurological: Negative for dizziness, weakness and headaches.     Vitals BP 132/84   Pulse 89   Temp (!) 97.2 F (36.2 C)   Ht 5' 3.5" (1.613 m)   Wt 202 lb (91.6 kg)   SpO2 98%   BMI 35.22 kg/m   Objective:   Physical Exam Vitals and nursing note reviewed.  Constitutional:      General: She is not in acute distress.    Appearance: Normal  appearance. She is not ill-appearing.  HENT:     Head: Normocephalic and atraumatic.     Nose: Nose normal.     Mouth/Throat:     Mouth: Mucous membranes are moist.     Pharynx: Oropharynx is clear.  Eyes:     Extraocular Movements: Extraocular movements intact.     Conjunctiva/sclera: Conjunctivae normal.     Pupils: Pupils are equal, round, and reactive to light.  Cardiovascular:     Rate and Rhythm: Normal rate and regular rhythm.     Pulses: Normal pulses.     Heart sounds: Normal heart sounds.  Pulmonary:     Effort: Pulmonary effort is normal.     Breath sounds: Normal breath sounds. No wheezing, rhonchi or rales.  Musculoskeletal:        General: Normal range of motion.     Right lower leg: No edema.     Left lower leg: No edema.  Skin:    General: Skin is warm and dry.     Findings: Rash (hypopigmented macules on bilateral forearms.  on flank, hyperpigmented macules.) present. No lesion.  Neurological:     General: No focal deficit present.     Mental Status: She is alert and oriented to person, place, and time.     Cranial Nerves: No cranial nerve deficit.     Motor: No weakness.  Psychiatric:        Mood and Affect: Mood normal.        Behavior: Behavior normal.      Assessment and Plan   1. Diabetes mellitus without complication (HCC) - POCT Glucose (Device for Home Use) - metFORMIN (GLUCOPHAGE XR) 500 MG 24 hr tablet; Take 1 tablet (500 mg total) by mouth 2 (two) times daily before a meal.  Dispense: 60 tablet; Refill: 0 - CMP14+EGFR - Hemoglobin A1c - CBC - Lipid panel - Microalbumin, urine - Dulaglutide (TRULICITY) 3 FI/4.3PI SOPN; Inject 3 mg into the skin once a week.  Dispense: 2 mL; Refill: 5 - glipiZIDE (GLUCOTROL XL) 10 MG 24 hr tablet; TAKE 1 TABLET BY MOUTH EVERY MORNING  Dispense: 90 tablet; Refill: 1 - amLODipine (NORVASC) 5 MG tablet; Take 1 tablet (5 mg total) by mouth in the morning.  Dispense: 90 tablet; Refill: 1 - Insulin Pen Needle (BD  PEN NEEDLE NANO 2ND GEN) 32G X 4 MM MISC; Use 2x per day as directed.  Dispense: 100 each; Refill: 2 - metFORMIN (GLUCOPHAGE) 500 MG tablet; Take 1 tablet (500 mg total) by mouth 2 (two) times daily with a meal.  Dispense: 60 tablet; Refill: 5  2. Essential hypertension - lisinopril (ZESTRIL) 10 MG tablet; Take 1 tablet (10 mg total) by mouth daily.  Dispense: 90 tablet; Refill: 1 - CMP14+EGFR -  Lipid panel  3. Tinea versicolor - KETOCONAZOLE, TOPICAL, 1 % SHAM; Apply to skin topically daily.  Lather and leave on skin for 5 mins for 2 wks on  rash, then use 2x per week for 4 more weeks.  Dispense: 200 mL; Refill: 1  4. Hyperlipidemia associated with type 2 diabetes mellitus (HCC) - atorvastatin (LIPITOR) 10 MG tablet; Take 1 tablet (10 mg total) by mouth daily.  Dispense: 90 tablet; Refill: 1   Tinea versicolor- start Ketoconazole shampoo for body for 2 wks daily, then going to 2x per week for 4 more weeks.  dm2- improving, but not controlled.  Bg in am has improved to 160-180s.  a1c in 8/21 was 10.2.  Will recheck today. Taking metformin, glipizide and trulicity and levemir. Noticing if taking metformin with other meds and amlodipine getting diarrhea.  Pt was 1066m bid and couldn't tolerate due to GI upset. Cont with current regimen, after labs may need to adjust the lantus. -will try to give pt metformin xr 5049mbid.  To see if can help with gi upset.  F/u 3 mo for well woman exam and dm2.

## 2020-01-30 LAB — CBC
Hematocrit: 42.5 % (ref 34.0–46.6)
Hemoglobin: 14.6 g/dL (ref 11.1–15.9)
MCH: 30.7 pg (ref 26.6–33.0)
MCHC: 34.4 g/dL (ref 31.5–35.7)
MCV: 89 fL (ref 79–97)
Platelets: 298 10*3/uL (ref 150–450)
RBC: 4.76 x10E6/uL (ref 3.77–5.28)
RDW: 12.4 % (ref 11.7–15.4)
WBC: 8.7 10*3/uL (ref 3.4–10.8)

## 2020-01-30 LAB — LIPID PANEL
Chol/HDL Ratio: 6.6 ratio — ABNORMAL HIGH (ref 0.0–4.4)
Cholesterol, Total: 218 mg/dL — ABNORMAL HIGH (ref 100–199)
HDL: 33 mg/dL — ABNORMAL LOW (ref >39)
LDL Chol Calc (NIH): 123 mg/dL — ABNORMAL HIGH (ref 0–99)
Triglycerides: 353 mg/dL — ABNORMAL HIGH (ref 0–149)
VLDL Cholesterol Cal: 62 mg/dL — ABNORMAL HIGH (ref 5–40)

## 2020-01-30 LAB — CMP14+EGFR
ALT: 49 IU/L — ABNORMAL HIGH (ref 0–32)
AST: 25 IU/L (ref 0–40)
Albumin/Globulin Ratio: 1.7 (ref 1.2–2.2)
Albumin: 4.4 g/dL (ref 3.9–5.0)
Alkaline Phosphatase: 77 IU/L (ref 44–121)
BUN/Creatinine Ratio: 11 (ref 9–23)
BUN: 8 mg/dL (ref 6–20)
Bilirubin Total: 0.2 mg/dL (ref 0.0–1.2)
CO2: 22 mmol/L (ref 20–29)
Calcium: 9.4 mg/dL (ref 8.7–10.2)
Chloride: 99 mmol/L (ref 96–106)
Creatinine, Ser: 0.7 mg/dL (ref 0.57–1.00)
GFR calc Af Amer: 136 mL/min/{1.73_m2} (ref >59)
GFR calc non Af Amer: 118 mL/min/{1.73_m2} (ref >59)
Globulin, Total: 2.6 g/dL (ref 1.5–4.5)
Glucose: 194 mg/dL — ABNORMAL HIGH (ref 65–99)
Potassium: 3.8 mmol/L (ref 3.5–5.2)
Sodium: 139 mmol/L (ref 134–144)
Total Protein: 7 g/dL (ref 6.0–8.5)

## 2020-01-30 LAB — HEMOGLOBIN A1C
Est. average glucose Bld gHb Est-mCnc: 252 mg/dL
Hgb A1c MFr Bld: 10.4 % — ABNORMAL HIGH (ref 4.8–5.6)

## 2020-01-30 LAB — MICROALBUMIN, URINE: Microalbumin, Urine: 109.6 ug/mL

## 2020-02-01 ENCOUNTER — Telehealth: Payer: Self-pay | Admitting: Family Medicine

## 2020-02-01 MED ORDER — ATORVASTATIN CALCIUM 10 MG PO TABS: 10.0000 mg | ORAL_TABLET | Freq: Every day | ORAL | 1 refills | Status: DC

## 2020-02-01 NOTE — Addendum Note (Signed)
Addended by: Annalee Genta on: 02/01/2020 09:02 AM   Modules accepted: Orders

## 2020-02-02 NOTE — Telephone Encounter (Signed)
error 

## 2020-02-03 ENCOUNTER — Other Ambulatory Visit: Payer: Self-pay | Admitting: *Deleted

## 2020-02-03 DIAGNOSIS — E119 Type 2 diabetes mellitus without complications: Secondary | ICD-10-CM

## 2020-02-03 MED ORDER — LEVEMIR FLEXTOUCH 100 UNIT/ML ~~LOC~~ SOPN: PEN_INJECTOR | SUBCUTANEOUS | 3 refills | Status: DC

## 2020-02-03 NOTE — Telephone Encounter (Signed)
Discussed result note with pt. She verbalized understanding and notified med sent to pharm.

## 2020-02-19 ENCOUNTER — Other Ambulatory Visit: Payer: Self-pay | Admitting: Family Medicine

## 2020-02-19 DIAGNOSIS — E119 Type 2 diabetes mellitus without complications: Secondary | ICD-10-CM

## 2020-03-02 ENCOUNTER — Encounter: Payer: Self-pay | Admitting: Nurse Practitioner

## 2020-03-02 ENCOUNTER — Ambulatory Visit (INDEPENDENT_AMBULATORY_CARE_PROVIDER_SITE_OTHER): Payer: 59 | Admitting: Nurse Practitioner

## 2020-03-02 ENCOUNTER — Other Ambulatory Visit: Payer: Self-pay

## 2020-03-02 VITALS — BP 154/129 | HR 117 | Resp 18 | Ht 64.0 in | Wt 197.8 lb

## 2020-03-02 DIAGNOSIS — I10 Essential (primary) hypertension: Secondary | ICD-10-CM

## 2020-03-02 DIAGNOSIS — E119 Type 2 diabetes mellitus without complications: Secondary | ICD-10-CM

## 2020-03-02 DIAGNOSIS — E785 Hyperlipidemia, unspecified: Secondary | ICD-10-CM | POA: Diagnosis not present

## 2020-03-02 DIAGNOSIS — E1169 Type 2 diabetes mellitus with other specified complication: Secondary | ICD-10-CM | POA: Diagnosis not present

## 2020-03-02 MED ORDER — TRULICITY 1.5 MG/0.5ML ~~LOC~~ SOAJ: 1.5000 mg | SUBCUTANEOUS | 3 refills | Status: DC

## 2020-03-02 MED ORDER — LEVEMIR FLEXTOUCH 100 UNIT/ML ~~LOC~~ SOPN: 40.0000 [IU] | PEN_INJECTOR | Freq: Every day | SUBCUTANEOUS | 3 refills | Status: DC

## 2020-03-02 NOTE — Patient Instructions (Signed)

## 2020-03-02 NOTE — Progress Notes (Signed)
Endocrinology Consult Note       03/02/2020, 11:44 AM   Subjective:    Patient ID: Melanie Mccoy, female    DOB: 1990/07/20.  Melanie Mccoy is being seen in consultation for management of currently uncontrolled symptomatic diabetes requested by  Annalee Genta, DO.   Past Medical History:  Diagnosis Date   Anemia    slightly anemic, iron supplements every 2-3 days   Bartholin cyst    Chlamydia 01/2011   Diabetes mellitus without complication (HCC)    Family history of anesthesia complication    grandmother had spinal with nerve damage   No pertinent past medical history     Past Surgical History:  Procedure Laterality Date   CESAREAN SECTION N/A 11/22/2012   Procedure: PRIMARY CESAREAN SECTION;  Surgeon: Esmeralda Arthur, MD;  Location: [facility];  Service: Obstetrics;  Laterality: N/A;   CYST REMOVAL TRUNK  2005    Social History   Socioeconomic History   Marital status: Single    Spouse name: Not on file   Number of children: Not on file   Years of education: Not on file   Highest education level: Not on file  Occupational History   Not on file  Tobacco Use   Smoking status: Never Smoker   Smokeless tobacco: Never Used  Vaping Use   Vaping Use: Never used  Substance and Sexual Activity   Alcohol use: No    Alcohol/week: 0.0 standard drinks   Drug use: No   Sexual activity: Yes  Other Topics Concern   Not on file  Social History Narrative   Not on file   Social Determinants of Health   Financial Resource Strain: Not on file  Food Insecurity: Not on file  Transportation Needs: Not on file  Physical Activity: Not on file  Stress: Not on file  Social Connections: Not on file    Family History  Problem Relation Age of Onset   Cancer Other    Heart disease Other    Diabetes Other    Diabetes Mother    Diabetes Father    Diabetes Maternal  Grandfather     Outpatient Encounter Medications as of 03/02/2020  Medication Sig   amLODipine (NORVASC) 5 MG tablet Take 1 tablet (5 mg total) by mouth in the morning.   atorvastatin (LIPITOR) 10 MG tablet Take 1 tablet (10 mg total) by mouth daily.   Dulaglutide (TRULICITY) 1.5 MG/0.5ML SOPN Inject 1.5 mg into the skin once a week.   glipiZIDE (GLUCOTROL XL) 10 MG 24 hr tablet TAKE 1 TABLET BY MOUTH EVERY MORNING   glucose blood test strip Use as instructed   Insulin Pen Needle (BD PEN NEEDLE NANO 2ND GEN) 32G X 4 MM MISC Use 2x per day as directed.   KETOCONAZOLE, TOPICAL, 1 % SHAM Apply to skin topically daily.  Lather and leave on skin for 5 mins for 2 wks on  rash, then use 2x per week for 4 more weeks.   Lancets (ACCU-CHEK SOFT TOUCH) lancets Use as instructed   lisinopril (ZESTRIL) 10 MG tablet Take 1 tablet (10 mg total) by mouth daily.   metFORMIN (GLUCOPHAGE)  500 MG tablet Take 1 tablet (500 mg total) by mouth 2 (two) times daily with a meal.   [DISCONTINUED] Dulaglutide (TRULICITY) 3 MG/0.5ML SOPN Inject 3 mg into the skin once a week.   [DISCONTINUED] insulin detemir (LEVEMIR FLEXTOUCH) 100 UNIT/ML FlexPen ADMINISTER 14 UNITS UNDER THE SKIN TWICE DAILY   insulin detemir (LEVEMIR FLEXTOUCH) 100 UNIT/ML FlexPen Inject 40 Units into the skin at bedtime.   [DISCONTINUED] metFORMIN (GLUCOPHAGE XR) 500 MG 24 hr tablet Take 1 tablet (500 mg total) by mouth 2 (two) times daily before a meal.   No facility-administered encounter medications on file as of 03/02/2020.    ALLERGIES: Allergies  Allergen Reactions   Bee Venom Swelling    Progressively worse with each sting   Ibuprofen Hives   Nickel Rash    VACCINATION STATUS: Immunization History  Administered Date(s) Administered   Tdap 06/26/2006    Diabetes She presents for her initial diabetic visit. She has type 2 diabetes mellitus. Onset time: She was diagnosed at approx age of 28. Her disease course has  been fluctuating. There are no hypoglycemic associated symptoms. Associated symptoms include blurred vision, fatigue, polydipsia, polyphagia and polyuria. There are no hypoglycemic complications. Symptoms are stable. There are no diabetic complications. Risk factors for coronary artery disease include diabetes mellitus, dyslipidemia, hypertension, obesity, sedentary lifestyle and family history. Current diabetic treatment includes insulin injections and oral agent (dual therapy) (Currently on Metformin 500 mg po twice daily, Trulicity 3 mg SQ weekly, Levemir 25 units BID, and Glipizide 10 mg XL daily). She is compliant with treatment most of the time. Her weight is decreasing steadily. She is following a generally unhealthy diet. When asked about meal planning, she reported none. She has not had a previous visit with a dietitian. She rarely participates in exercise. There is no change in her home blood glucose trend. (She presents for her consultation with no meter or logs to review.  Her most recent A1c was 10.4% on 01/29/20, increasing slightly from previous A1c of 10.2%.  She reports a family history of type 2 diabetes in her family.  She routinely monitors her glucose once daily, usually before breakfast.  She endorses drinking mostly water with occasional sweet tea and frequently skips breakfast and snacks in the afternoon.  She does not engage in routine exercise program.  She denies any s/s of hypoglycemia.) An ACE inhibitor/angiotensin II receptor blocker is being taken. She does not see a podiatrist.Eye exam is current.  Hypertension This is a chronic problem. The current episode started more than 1 year ago. The problem has been waxing and waning since onset. The problem is uncontrolled. Associated symptoms include blurred vision. There are no associated agents to hypertension. Risk factors for coronary artery disease include diabetes mellitus, dyslipidemia, obesity and sedentary lifestyle. Past  treatments include calcium channel blockers and ACE inhibitors. The current treatment provides mild improvement. Compliance problems include diet and exercise.   Hyperlipidemia This is a chronic problem. The current episode started more than 1 year ago. The problem is uncontrolled. Recent lipid tests were reviewed and are high. Exacerbating diseases include diabetes and obesity. There are no known factors aggravating her hyperlipidemia. Current antihyperlipidemic treatment includes statins. The current treatment provides mild improvement of lipids. Compliance problems include adherence to diet and adherence to exercise.  Risk factors for coronary artery disease include diabetes mellitus, dyslipidemia, hypertension, obesity and a sedentary lifestyle.    Review of systems  Constitutional: + steadily decreasing body weight,  current Body  mass index is 33.95 kg/m. , + fatigue, no subjective hyperthermia, no subjective hypothermia Eyes: + blurry vision, no xerophthalmia ENT: no sore throat, no nodules palpated in throat, no dysphagia/odynophagia, no hoarseness Cardiovascular: no chest pain, no shortness of breath, no palpitations, no leg swelling Respiratory: no cough, no shortness of breath Gastrointestinal: no nausea/vomiting/diarrhea Genitourinary: + polyuria Musculoskeletal: no muscle/joint aches Skin: no rashes, no hyperemia Neurological: no tremors, no numbness, no tingling, no dizziness Psychiatric: no depression, no anxiety   Objective:     BP (!) 154/129 (BP Location: Right Arm, Patient Position: Sitting, Cuff Size: Normal)    Pulse (!) 117    Resp 18    Ht 5\' 4"  (1.626 m)    Wt 197 lb 12.8 oz (89.7 kg)    SpO2 97%    BMI 33.95 kg/m   Wt Readings from Last 3 Encounters:  03/02/20 197 lb 12.8 oz (89.7 kg)  01/29/20 202 lb (91.6 kg)  09/18/19 205 lb (93 kg)    BP Readings from Last 3 Encounters:  03/02/20 (!) 154/129  01/29/20 132/84  09/18/19 (!) 134/101    Physical Exam-  Limited  Constitutional:  Body mass index is 33.95 kg/m. , not in acute distress, normal state of mind Eyes:  EOMI, no exophthalmos Neck: Supple Respiratory: Adequate breathing efforts, no crackles, rales, rhonchi, or wheezing Musculoskeletal: no gross deformities, strength intact in all four extremities, no gross restriction of joint movements Skin:  no rashes, no hyperemia Neurological: no tremor with outstretched hands   CMP ( most recent) CMP     Component Value Date/Time   NA 139 01/29/2020 0926   K 3.8 01/29/2020 0926   CL 99 01/29/2020 0926   CO2 22 01/29/2020 0926   GLUCOSE 194 (H) 01/29/2020 0926   GLUCOSE 318 (H) 07/10/2019 0506   BUN 8 01/29/2020 0926   CREATININE 0.70 01/29/2020 0926   CREATININE 0.77 01/14/2016 1514   CALCIUM 9.4 01/29/2020 0926   PROT 7.0 01/29/2020 0926   ALBUMIN 4.4 01/29/2020 0926   AST 25 01/29/2020 0926   ALT 49 (H) 01/29/2020 0926   ALKPHOS 77 01/29/2020 0926   BILITOT 0.2 01/29/2020 0926   GFRNONAA 118 01/29/2020 0926   GFRNONAA >89 01/14/2016 1514   GFRAA 136 01/29/2020 0926   GFRAA >89 01/14/2016 1514     Diabetic Labs (most recent): Lab Results  Component Value Date   HGBA1C 10.4 (H) 01/29/2020   HGBA1C 10.2 (H) 10/24/2019   HGBA1C 11.0 (H) 07/09/2019     Lipid Panel ( most recent) Lipid Panel     Component Value Date/Time   CHOL 218 (H) 01/29/2020 0926   TRIG 353 (H) 01/29/2020 0926   HDL 33 (L) 01/29/2020 0926   CHOLHDL 6.6 (H) 01/29/2020 0926   CHOLHDL 5.1 07/10/2019 0506   VLDL 32 07/10/2019 0506   LDLCALC 123 (H) 01/29/2020 0926   LABVLDL 62 (H) 01/29/2020 0926      Lab Results  Component Value Date   TSH 2.00 01/14/2016           Assessment & Plan:   1) Type 2 diabetes mellitus without complication, without long-term current use of insulin (HCC)  - Vernell B Shizuko Wojdyla has currently uncontrolled symptomatic type 2 DM since 29 years of age,  with most recent A1c of 10.4 %. Recent labs  reviewed.  She presents for her consultation with no meter or logs to review.  Her most recent A1c was 10.4% on 01/29/20, increasing slightly  from previous A1c of 10.2%.  She reports a family history of type 2 diabetes in her family.  She routinely monitors her glucose once daily, usually before breakfast.  She endorses drinking mostly water with occasional sweet tea and frequently skips breakfast and snacks in the afternoon.  She does not engage in routine exercise program.  She denies any s/s of hypoglycemia.  - I had a long discussion with her about the progressive nature of diabetes and the pathology behind its complications.  -She does not currently have any complications from diabetes but she remains at a high risk for more acute and chronic complications which include CAD, CVA, CKD, retinopathy, and neuropathy. These are all discussed in detail with her.  - I have counseled her on diet  and weight management  by adopting a carbohydrate restricted/protein rich diet. Patient is encouraged to switch to  unprocessed or minimally processed     complex starch and increased protein intake (animal or plant source), fruits, and vegetables. -  she is advised to stick to a routine mealtimes to eat 3 meals  a day and avoid unnecessary snacks ( to snack only to correct hypoglycemia).   - she acknowledges that there is a room for improvement in her food and drink choices. - Suggestion is made for her to avoid simple carbohydrates  from her diet including Cakes, Sweet Desserts, Ice Cream, Soda (diet and regular), Sweet Tea, Candies, Chips, Cookies, Store Bought Juices, Alcohol in Excess of  1-2 drinks a day, Artificial Sweeteners,  Coffee Creamer, and "Sugar-free" Products. This will help patient to have more stable blood glucose profile and potentially avoid unintended weight gain.  - she will be scheduled with Norm Salt, RDN, CDE for diabetes education.  - I have approached her with the following  individualized plan to manage  her diabetes and patient agrees:   -She is advised to change her Levemir to 40 units once daily at bedtime instead of twice daily.  She is advised to lower her dose of Trulicity to 1.5 mg SQ weekly (after she uses up her current supply of 3 mg dose) due to elevated triglycerides and increased risk of pancreatitis.  She can continue Glipizide 10 mg XL daily with breakfast for now.  -she is encouraged to start monitoring glucose 4 times daily, before meals and before bed, to log their readings on the clinic sheets provided, and bring them to review at follow up appointment in 2 weeks.  - she is warned not to take insulin without proper monitoring per orders. - Adjustment parameters are given to her for hypo and hyperglycemia in writing. - she is encouraged to call clinic for blood glucose levels less than 70 or above 300 mg /dl. - she is advised to continue Metformin 500 mg po twice daily with meals, therapeutically suitable for patient .  - Specific targets for  A1c;  LDL, HDL,  and Triglycerides were discussed with the patient.  2) Blood Pressure /Hypertension: Her blood pressure is not controlled to target.  She is advised to continue Amlodipine 5 mg po daily, and Lisinopril 10 mg po daily.  May consider dose change at next visit if BP continues to be elevated.  3) Lipids/Hyperlipidemia:    Her most recent lipid panel from 01/29/20 shows uncontrolled LDL of 123 and elevated triglycerides of 353.  She is advised to continue Lipitor 10 mg po daily at bedtime.  Side effects and precautions discussed with her.  She was advised to  avoid fried foods and butter.    4)  Weight/Diet:  her Body mass index is 33.95 kg/m.  - clearly complicating her diabetes care.   she is a candidate for weight loss. I discussed with her the fact that loss of 5 - 10% of her  current body weight will have the most impact on her diabetes management.  Exercise, and detailed carbohydrates  information provided  -  detailed on discharge instructions.  5) Chronic Care/Health Maintenance: -she is on ACEI/ARB and Statin medications and  is encouraged to initiate and continue to follow up with Ophthalmology, Dentist,  Podiatrist at least yearly or according to recommendations, and advised to stay away from smoking. I have recommended yearly flu vaccine and pneumonia vaccine at least every 5 years; moderate intensity exercise for up to 150 minutes weekly; and  sleep for at least 7 hours a day.  - she is  advised to maintain close follow up with Annalee Gentaaylor, Malena M, DO for primary care needs, as well as her other providers for optimal and coordinated care.   - Time spent in this patient care: 60 min, of which > 50% was spent in  counseling  her about her diabetes and the rest reviewing her blood glucose logs , discussing her hypoglycemia and hyperglycemia episodes, reviewing her current and  previous labs / studies  ( including abstraction from other facilities) and medications  doses and developing a  long term treatment plan based on the latest standards of care/ guidelines; and documenting her care.    Please refer to Patient Instructions for Blood Glucose Monitoring and Insulin/Medications Dosing Guide"  in media tab for additional information. Please  also refer to " Patient Self Inventory" in the Media  tab for reviewed elements of pertinent patient history.  Gladstone LighterKayla B Slade Sams participated in the discussions, expressed understanding, and voiced agreement with the above plans.  All questions were answered to her satisfaction. she is encouraged to contact clinic should she have any questions or concerns prior to her return visit.   Follow up plan: - Return in about 2 weeks (around 03/16/2020) for Diabetes follow up, Bring glucometer and logs.  Ronny BaconWhitney Jairy Angulo, [facility] Medical CenterFNP-BC [facility] [address] [place], [place] [address] Phone: [phone] Fax:  [phone]  03/02/2020, 11:44 AM

## 2020-03-16 ENCOUNTER — Other Ambulatory Visit: Payer: Self-pay

## 2020-03-16 ENCOUNTER — Encounter: Payer: Self-pay | Admitting: Nurse Practitioner

## 2020-03-16 ENCOUNTER — Ambulatory Visit: Payer: 59 | Admitting: Nurse Practitioner

## 2020-03-16 VITALS — BP 131/92 | HR 82 | Ht 64.0 in | Wt 201.0 lb

## 2020-03-16 DIAGNOSIS — I10 Essential (primary) hypertension: Secondary | ICD-10-CM

## 2020-03-16 DIAGNOSIS — E1169 Type 2 diabetes mellitus with other specified complication: Secondary | ICD-10-CM

## 2020-03-16 DIAGNOSIS — E785 Hyperlipidemia, unspecified: Secondary | ICD-10-CM

## 2020-03-16 DIAGNOSIS — E119 Type 2 diabetes mellitus without complications: Secondary | ICD-10-CM | POA: Diagnosis not present

## 2020-03-16 MED ORDER — LEVEMIR FLEXTOUCH 100 UNIT/ML ~~LOC~~ SOPN: 50.0000 [IU] | PEN_INJECTOR | Freq: Every day | SUBCUTANEOUS | 3 refills | Status: DC

## 2020-03-16 NOTE — Progress Notes (Signed)
Endocrinology Follow Up Visit       03/16/2020, 11:03 AM   Subjective:    Patient ID: Melanie Mccoy, female    DOB: [birth year].  Melanie Mccoy is being seen in follow up after being seen in consultation for management of currently uncontrolled symptomatic diabetes requested by  Annalee Genta, DO.   Past Medical History:  Diagnosis Date  . Anemia    slightly anemic, iron supplements every 2-3 days  . Bartholin cyst   . Chlamydia 01/2011  . Diabetes mellitus without complication (HCC)   . Family history of anesthesia complication    grandmother had spinal with nerve damage  . No pertinent past medical history     Past Surgical History:  Procedure Laterality Date  . CESAREAN SECTION N/A 11/22/2012   Procedure: PRIMARY CESAREAN SECTION;  Surgeon: Esmeralda Arthur, MD;  Location: [facility];  Service: Obstetrics;  Laterality: N/A;  . CYST REMOVAL TRUNK  2005    Social History   Socioeconomic History  . Marital status: Single    Spouse name: Not on file  . Number of children: Not on file  . Years of education: Not on file  . Highest education level: Not on file  Occupational History  . Not on file  Tobacco Use  . Smoking status: Never Smoker  . Smokeless tobacco: Never Used  Vaping Use  . Vaping Use: Never used  Substance and Sexual Activity  . Alcohol use: No    Alcohol/week: 0.0 standard drinks  . Drug use: No  . Sexual activity: Yes  Other Topics Concern  . Not on file  Social History Narrative  . Not on file   Social Determinants of Health   Financial Resource Strain: Not on file  Food Insecurity: Not on file  Transportation Needs: Not on file  Physical Activity: Not on file  Stress: Not on file  Social Connections: Not on file    Family History  Problem Relation Age of Onset  . Cancer Other   . Heart disease Other   . Diabetes Other   . Diabetes Mother   . Diabetes Father    . Diabetes Maternal Grandfather     Outpatient Encounter Medications as of 03/16/2020  Medication Sig  . amLODipine (NORVASC) 5 MG tablet Take 1 tablet (5 mg total) by mouth in the morning.  Marland Kitchen atorvastatin (LIPITOR) 10 MG tablet Take 1 tablet (10 mg total) by mouth daily.  . Dulaglutide (TRULICITY) 1.5 MG/0.5ML SOPN Inject 1.5 mg into the skin once a week.  Marland Kitchen glipiZIDE (GLUCOTROL XL) 10 MG 24 hr tablet TAKE 1 TABLET BY MOUTH EVERY MORNING  . glucose blood test strip Use as instructed  . insulin detemir (LEVEMIR FLEXTOUCH) 100 UNIT/ML FlexPen Inject 50 Units into the skin at bedtime.  . Insulin Pen Needle (BD PEN NEEDLE NANO 2ND GEN) 32G X 4 MM MISC Use 2x per day as directed.  Marland Kitchen KETOCONAZOLE, TOPICAL, 1 % SHAM Apply to skin topically daily.  Lather and leave on skin for 5 mins for 2 wks on  rash, then use 2x per week for 4 more weeks.  . Lancets (ACCU-CHEK SOFT TOUCH) lancets Use  as instructed  . lisinopril (ZESTRIL) 10 MG tablet Take 1 tablet (10 mg total) by mouth daily.  . metFORMIN (GLUCOPHAGE) 500 MG tablet Take 1 tablet (500 mg total) by mouth 2 (two) times daily with a meal.  . [DISCONTINUED] insulin detemir (LEVEMIR FLEXTOUCH) 100 UNIT/ML FlexPen Inject 40 Units into the skin at bedtime.   No facility-administered encounter medications on file as of 03/16/2020.    ALLERGIES: Allergies  Allergen Reactions  . Bee Venom Swelling    Progressively worse with each sting  . Ibuprofen Hives  . Nickel Rash    VACCINATION STATUS: Immunization History  Administered Date(s) Administered  . Tdap 06/26/2006    Diabetes She presents for her follow-up diabetic visit. She has type 2 diabetes mellitus. Onset time: She was diagnosed at approx age of 9. Her disease course has been stable. There are no hypoglycemic associated symptoms. Associated symptoms include blurred vision, fatigue, polydipsia, polyphagia and polyuria. There are no hypoglycemic complications. Symptoms are improving.  There are no diabetic complications. Risk factors for coronary artery disease include diabetes mellitus, dyslipidemia, hypertension, obesity, sedentary lifestyle and family history. Current diabetic treatment includes insulin injections and oral agent (dual therapy). She is compliant with treatment most of the time. Her weight is fluctuating minimally. She is following a generally healthy diet. When asked about meal planning, she reported none. She has not had a previous visit with a dietitian. She rarely participates in exercise. Her home blood glucose trend is fluctuating minimally. Her breakfast blood glucose range is generally 180-200 mg/dl. Her lunch blood glucose range is generally 180-200 mg/dl. Her dinner blood glucose range is generally >200 mg/dl. Her bedtime blood glucose range is generally >200 mg/dl. Her overall blood glucose range is >200 mg/dl. (She presents for her follow up today with her meter and logs showing persistent fasting and postprandial hyperglycemia.  Her A1c last visit was 10.4%.  Her Trulicity was decreased at last visit due to elevated triglycerides increasing her risk for pancreatitis.  She reports she has been working on her diet since last visit.  There are no documented or reported episodes of hypoglycemia.) An ACE inhibitor/angiotensin II receptor blocker is being taken. She does not see a podiatrist.Eye exam is current.  Hypertension This is a chronic problem. The current episode started more than 1 year ago. The problem has been gradually improving since onset. The problem is controlled. Associated symptoms include blurred vision. There are no associated agents to hypertension. Risk factors for coronary artery disease include diabetes mellitus, dyslipidemia, obesity and sedentary lifestyle. Past treatments include calcium channel blockers and ACE inhibitors. The current treatment provides mild improvement. Compliance problems include diet and exercise.   Hyperlipidemia This  is a chronic problem. The current episode started more than 1 year ago. The problem is uncontrolled. Recent lipid tests were reviewed and are high. Exacerbating diseases include diabetes and obesity. There are no known factors aggravating her hyperlipidemia. Current antihyperlipidemic treatment includes statins. The current treatment provides mild improvement of lipids. Compliance problems include adherence to diet and adherence to exercise.  Risk factors for coronary artery disease include diabetes mellitus, dyslipidemia, hypertension, obesity and a sedentary lifestyle.    Review of systems  Constitutional: + minimally fluctuating body weight,  current Body mass index is 34.5 kg/m. , + fatigue, no subjective hyperthermia, no subjective hypothermia Eyes: + blurry vision (improving), no xerophthalmia ENT: no sore throat, no nodules palpated in throat, no dysphagia/odynophagia, no hoarseness Cardiovascular: no chest pain, no shortness of breath,  no palpitations, no leg swelling Respiratory: no cough, no shortness of breath Gastrointestinal: no nausea/vomiting/diarrhea Genitourinary: + polyuria (improving) Musculoskeletal: no muscle/joint aches Skin: no rashes, no hyperemia Neurological: no tremors, no numbness, no tingling, no dizziness Psychiatric: no depression, no anxiety   Objective:     BP (!) 131/92   Pulse 82   Ht 5\' 4"  (1.626 m)   Wt 201 lb (91.2 kg)   BMI 34.50 kg/m   Wt Readings from Last 3 Encounters:  03/16/20 201 lb (91.2 kg)  03/02/20 197 lb 12.8 oz (89.7 kg)  01/29/20 202 lb (91.6 kg)    BP Readings from Last 3 Encounters:  03/16/20 (!) 131/92  03/02/20 (!) 154/129  01/29/20 132/84    Physical Exam- Limited  Constitutional:  Body mass index is 34.5 kg/m. , not in acute distress, normal state of mind Eyes:  EOMI, no exophthalmos Neck: Supple Respiratory: Adequate breathing efforts, no crackles, rales, rhonchi, or wheezing Musculoskeletal: no gross  deformities, strength intact in all four extremities, no gross restriction of joint movements Skin:  no rashes, no hyperemia Neurological: no tremor with outstretched hands   CMP ( most recent) CMP     Component Value Date/Time   NA 139 01/29/2020 0926   K 3.8 01/29/2020 0926   CL 99 01/29/2020 0926   CO2 22 01/29/2020 0926   GLUCOSE 194 (H) 01/29/2020 0926   GLUCOSE 318 (H) 07/10/2019 0506   BUN 8 01/29/2020 0926   CREATININE 0.70 01/29/2020 0926   CREATININE 0.77 01/14/2016 1514   CALCIUM 9.4 01/29/2020 0926   PROT 7.0 01/29/2020 0926   ALBUMIN 4.4 01/29/2020 0926   AST 25 01/29/2020 0926   ALT 49 (H) 01/29/2020 0926   ALKPHOS 77 01/29/2020 0926   BILITOT 0.2 01/29/2020 0926   GFRNONAA 118 01/29/2020 0926   GFRNONAA >89 01/14/2016 1514   GFRAA 136 01/29/2020 0926   GFRAA >89 01/14/2016 1514     Diabetic Labs (most recent): Lab Results  Component Value Date   HGBA1C 10.4 (H) 01/29/2020   HGBA1C 10.2 (H) 10/24/2019   HGBA1C 11.0 (H) 07/09/2019     Lipid Panel ( most recent) Lipid Panel     Component Value Date/Time   CHOL 218 (H) 01/29/2020 0926   TRIG 353 (H) 01/29/2020 0926   HDL 33 (L) 01/29/2020 0926   CHOLHDL 6.6 (H) 01/29/2020 0926   CHOLHDL 5.1 07/10/2019 0506   VLDL 32 07/10/2019 0506   LDLCALC 123 (H) 01/29/2020 0926   LABVLDL 62 (H) 01/29/2020 0926      Lab Results  Component Value Date   TSH 2.00 01/14/2016           Assessment & Plan:   1) Type 2 diabetes mellitus without complication, without long-term current use of insulin (HCC)  - Melanie Mccoy has currently uncontrolled symptomatic type 2 DM since 30 years of age,  with most recent A1c of 10.4 %. Recent labs reviewed.  She presents for her follow up today with her meter and logs showing persistent fasting and postprandial hyperglycemia.  Her A1c last visit was 10.4%.  Her Trulicity was decreased at last visit due to elevated triglycerides increasing her risk for pancreatitis.   She reports she has been working on her diet since last visit.  There are no documented or reported episodes of hypoglycemia.  - I had a long discussion with her about the progressive nature of diabetes and the pathology behind its complications.  -She does not  currently have any complications from diabetes but she remains at a high risk for more acute and chronic complications which include CAD, CVA, CKD, retinopathy, and neuropathy. These are all discussed in detail with her.  - Nutritional counseling repeated at each appointment due to patients tendency to fall back in to old habits.  - The patient admits there is a room for improvement in their diet and drink choices. -  Suggestion is made for the patient to avoid simple carbohydrates from their diet including Cakes, Sweet Desserts / Pastries, Ice Cream, Soda (diet and regular), Sweet Tea, Candies, Chips, Cookies, Sweet Pastries,  Store Bought Juices, Alcohol in Excess of  1-2 drinks a day, Artificial Sweeteners, Coffee Creamer, and "Sugar-free" Products. This will help patient to have stable blood glucose profile and potentially avoid unintended weight gain.   - I encouraged the patient to switch to  unprocessed or minimally processed complex starch and increased protein intake (animal or plant source), fruits, and vegetables.   - Patient is advised to stick to a routine mealtimes to eat 3 meals  a day and avoid unnecessary snacks ( to snack only to correct hypoglycemia).  - she is scheduled with Norm SaltPenny Crumpton, RDN, CDE for diabetes education in the coming weeks.  - I have approached her with the following individualized plan to manage  her diabetes and patient agrees:   -Given her persistent hyperglycemia, she will tolerate increase in her Levemir to 50 units SQ daily at bedtime.  She is advised to continue Trulicity 1.5 mg SQ weekly, continue Metformin 500 mg po twice daily with meals, and continue Glipizide 10 mg XL daily with breakfast  for now. I discussed the potential for increasing her Metformin dose but she says she did not tolerate higher doses of this before (unsure if she had tried the ER form in the past) due to GI side effects.  She says she will look at her medications at home and let me know if she has tried the ER form as this may be a possibility in the future.  -she is encouraged to continue monitoring blood glucose twice daily, before breakfast and before bed, and to call the clinic if she has readings less than 70 or greater than 300 for 3 tests in a row.  - she is warned not to take insulin without proper monitoring per orders. - Adjustment parameters are given to her for hypo and hyperglycemia in writing.  - Specific targets for  A1c;  LDL, HDL,  and Triglycerides were discussed with the patient.  2) Blood Pressure /Hypertension: Her blood pressure is controlled to target.  She is advised to continue Amlodipine 5 mg po daily, and Lisinopril 10 mg po daily.    3) Lipids/Hyperlipidemia:    Her most recent lipid panel from 01/29/20 shows uncontrolled LDL of 123 and elevated triglycerides of 353.  She is advised to continue Lipitor 10 mg po daily at bedtime.  Side effects and precautions discussed with her.  She was advised to avoid fried foods and butter.    4)  Weight/Diet:  her Body mass index is 34.5 kg/m.  - clearly complicating her diabetes care.   she is a candidate for weight loss. I discussed with her the fact that loss of 5 - 10% of her  current body weight will have the most impact on her diabetes management.  Exercise, and detailed carbohydrates information provided  -  detailed on discharge instructions.  5) Chronic Care/Health Maintenance: -she  is on ACEI/ARB and Statin medications and is encouraged to initiate and continue to follow up with Ophthalmology, Dentist,  Podiatrist at least yearly or according to recommendations, and advised to stay away from smoking. I have recommended yearly flu vaccine  and pneumonia vaccine at least every 5 years; moderate intensity exercise for up to 150 minutes weekly; and  sleep for at least 7 hours a day.  - she is  advised to maintain close follow up with Annalee Genta, DO for primary care needs, as well as her other providers for optimal and coordinated care.   - Time spent on this patient care encounter:  35 min, of which > 50% was spent in  counseling and the rest reviewing her blood glucose logs , discussing her hypoglycemia and hyperglycemia episodes, reviewing her current and  previous labs / studies  ( including abstraction from other facilities) and medications  doses and developing a  long term treatment plan and documenting her care.   Please refer to Patient Instructions for Blood Glucose Monitoring and Insulin/Medications Dosing Guide"  in media tab for additional information. Please  also refer to " Patient Self Inventory" in the Media  tab for reviewed elements of pertinent patient history.  Melanie Mccoy participated in the discussions, expressed understanding, and voiced agreement with the above plans.  All questions were answered to her satisfaction. she is encouraged to contact clinic should she have any questions or concerns prior to her return visit.  Follow up plan: - Return in about 3 months (around 06/14/2020) for Diabetes follow up with A1c in office, ABI next visit, Bring glucometer and logs.  [name], [facility] [address] [place], [place] [address] Phone: [phone] Fax: [phone]  03/16/2020, 11:03 AM

## 2020-03-16 NOTE — Patient Instructions (Signed)

## 2020-04-02 ENCOUNTER — Encounter: Payer: 59 | Attending: Nurse Practitioner | Admitting: Nutrition

## 2020-04-02 ENCOUNTER — Encounter: Payer: Self-pay | Admitting: Nutrition

## 2020-04-02 ENCOUNTER — Other Ambulatory Visit: Payer: Self-pay

## 2020-04-02 VITALS — Ht 64.0 in | Wt 201.0 lb

## 2020-04-02 DIAGNOSIS — I10 Essential (primary) hypertension: Secondary | ICD-10-CM | POA: Diagnosis not present

## 2020-04-02 DIAGNOSIS — E119 Type 2 diabetes mellitus without complications: Secondary | ICD-10-CM | POA: Diagnosis present

## 2020-04-02 DIAGNOSIS — E785 Hyperlipidemia, unspecified: Secondary | ICD-10-CM | POA: Insufficient documentation

## 2020-04-02 DIAGNOSIS — E1169 Type 2 diabetes mellitus with other specified complication: Secondary | ICD-10-CM | POA: Insufficient documentation

## 2020-04-02 NOTE — Patient Instructions (Signed)
Goals set by patient  Work on portion control Count carbs and read foods label. Stick to 30-45 g carbs per meals Get FBS Less than 130 in am and less than 180 before bed Increase fiber in diet. Get A1C down to 7%

## 2020-04-02 NOTE — Progress Notes (Signed)
Medical Nutrition Therapy  Appointment Start time:  0800 Appointment End time:  0900 Dx 6 yrs Currently taking 50 levermir, Trulicity, metformin 500 BID, Glipizide Primary concerns today: getting blood sugars down, high cholesterol and  and losing weight Referral diagnosis: e11.8, e78.0 Preferred learning style:  no preference indicated Learning readiness: change in progress   NUTRITION ASSESSMENT   Anthropometrics  Clinical Medical Hx: HTN and Hyperlipidemai Medications: Levemeir 50 units, Trulicity, Metformin 500 mg BID, Glipizide Labs: FBS: 160-300  Bedtime: 240's.  Lab Results  Component Value Date   HGBA1C 10.4 (H) 01/29/2020   CMP Latest Ref Rng & Units 01/29/2020 10/24/2019 07/10/2019  Glucose 65 - 99 mg/dL 154(M) 086(P) 619(J)  BUN 6 - 20 mg/dL 8 14 13   Creatinine 0.57 - 1.00 mg/dL 0.93 2.67  Sodium 134 - 144 mmol/L 139 139 139  Potassium 3.5 - 5.2 mmol/L 3.8 4.4 4.3  Chloride 96 - 106 mmol/L 99 101 111  CO2 20 - 29 mmol/L 22 21 15(L)  Calcium 8.7 - 10.2 mg/dL 9.4 9.6 1.24)  Total Protein 6.0 - 8.5 g/dL 7.0 6.6 6.9  Total Bilirubin 0.0 - 1.2 mg/dL 0.2 5.8(K 1.0  Alkaline Phos 44 - 121 IU/L 77 68 68  AST 0 - 40 IU/L 25 17 38  ALT 0 - 32 IU/L 49(H) 22 34   Lipid Panel     Component Value Date/Time   CHOL 218 (H) 01/29/2020 0926   TRIG 353 (H) 01/29/2020 0926   HDL 33 (L) 01/29/2020 0926   CHOLHDL 6.6 (H) 01/29/2020 0926   CHOLHDL 5.1 07/10/2019 0506   VLDL 32 07/10/2019 0506   LDLCALC 123 (H) 01/29/2020 0926   LABVLDL 62 (H) 01/29/2020 0926   Wt Readings from Last 3 Encounters:  04/02/20 201 lb (91.2 kg)  03/16/20 201 lb (91.2 kg)  03/02/20 197 lb 12.8 oz (89.7 kg)   Ht Readings from Last 3 Encounters:  04/02/20 5\' 4"  (1.626 m)  03/16/20 5\' 4"  (1.626 m)  03/02/20 5\' 4"  (1.626 m)   Body mass index is 34.5 kg/m. @BMIFA @ Facility age limit for growth percentiles is 20 years. Facility age limit for growth percentiles is 20 years.   Notable  Signs/Symptoms: Fatigue, increased thirst, frequent urination, blurry vision  Lifestyle & Dietary Hx MARRIED. She Has been working on more lower carb vegetables, more protien and less carbohydrates. She works a full time job and a part time job. Has a 6 yr old daughter. Motivated to improve her DM.   Estimated daily fluid intake: 100 oz Supplements: none Sleep: 4-7 hrs  Stress / self-care: jobs are stressing Current average weekly physical activity: 2 hours  24-Hr Dietary Recall First Meal: Deli chicken sandwich, water Snack: few pretzels Second Meal: Quesidilla at , water Snack: Third Meal: chicken with pepper/onions and halapenos Snack:  Beverages: water  Estimated Energy Needs Calories: 1200-1400 Carbohydrate: 135g Protein: 90g Fat: 33g   NUTRITION DIAGNOSIS  NB-1.1 Food and nutrition-related knowledge deficit As related to Diabetes Type 2.  As evidenced by A1C 10.4%.   NUTRITION INTERVENTION  Nutrition education (E-1) on the following topics:  Nutrition and Diabetes education provided on My Plate, CHO counting, meal planning, portion sizes, timing of meals, avoiding snacks between meals unless having a low blood sugar, target ranges for A1C and blood sugars, signs/symptoms and treatment of hyper/hypoglycemia, monitoring blood sugars, taking medications as prescribed, benefits of exercising 30 minutes per day and prevention of complications of DM.  Handouts Provided Include  Goals set by patient  Work on portion control Count carbs and read foods label. Stick to 30-45 g carbs per meals Get FBS Less than 130 in am and less than 180 before bed Increase fiber in diet. Get A1C down to 7%    Learning Style & Readiness for Change Teaching method utilized: Visual & Auditory  Demonstrated degree of understanding via: Teach Back  Barriers to learning/adherence to lifestyle change: None  Goals Established by Pt Goals set by patient  Work on portion  control Count carbs and read foods label. Stick to 30-45 g carbs per meals Get FBS Less than 130 in am and less than 180 before bed Increase fiber in diet. Get A1C down to 7%   MONITORING & EVALUATION Dietary intake, weekly physical activity, and blood sugars  in 1 month.  Next Steps  Patient is to start meal planning.Marland Kitchen

## 2020-04-09 ENCOUNTER — Encounter: Payer: Self-pay | Admitting: Nutrition

## 2020-05-01 ENCOUNTER — Encounter: Payer: Self-pay | Admitting: Family Medicine

## 2020-05-01 ENCOUNTER — Other Ambulatory Visit: Payer: Self-pay

## 2020-05-01 ENCOUNTER — Ambulatory Visit (INDEPENDENT_AMBULATORY_CARE_PROVIDER_SITE_OTHER): Payer: 59 | Admitting: Family Medicine

## 2020-05-01 VITALS — BP 126/84 | HR 96 | Temp 97.0°F | Ht 64.0 in | Wt 197.8 lb

## 2020-05-01 DIAGNOSIS — Z01419 Encounter for gynecological examination (general) (routine) without abnormal findings: Secondary | ICD-10-CM

## 2020-05-01 DIAGNOSIS — E1169 Type 2 diabetes mellitus with other specified complication: Secondary | ICD-10-CM | POA: Diagnosis not present

## 2020-05-01 DIAGNOSIS — E119 Type 2 diabetes mellitus without complications: Secondary | ICD-10-CM | POA: Diagnosis not present

## 2020-05-01 DIAGNOSIS — B373 Candidiasis of vulva and vagina: Secondary | ICD-10-CM | POA: Diagnosis not present

## 2020-05-01 DIAGNOSIS — E785 Hyperlipidemia, unspecified: Secondary | ICD-10-CM

## 2020-05-01 DIAGNOSIS — I10 Essential (primary) hypertension: Secondary | ICD-10-CM

## 2020-05-01 DIAGNOSIS — B3731 Acute candidiasis of vulva and vagina: Secondary | ICD-10-CM

## 2020-05-01 MED ORDER — FLUCONAZOLE 150 MG PO TABS: ORAL_TABLET | ORAL | 0 refills | Status: DC

## 2020-05-01 MED ORDER — LISINOPRIL 10 MG PO TABS: 10.0000 mg | ORAL_TABLET | Freq: Every day | ORAL | 1 refills | Status: DC

## 2020-05-01 MED ORDER — ATORVASTATIN CALCIUM 10 MG PO TABS
10.0000 mg | ORAL_TABLET | Freq: Every day | ORAL | 1 refills | Status: DC
Start: 2020-05-01 — End: 2020-09-28

## 2020-05-01 MED ORDER — AMLODIPINE BESYLATE 5 MG PO TABS
5.0000 mg | ORAL_TABLET | Freq: Every morning | ORAL | 1 refills | Status: DC
Start: 2020-05-01 — End: 2020-09-28

## 2020-05-01 MED ORDER — KETOCONAZOLE 2 % EX SHAM: MEDICATED_SHAMPOO | CUTANEOUS | 1 refills | Status: DC

## 2020-05-01 NOTE — Progress Notes (Signed)
Patient ID: Melanie Mccoy, female    DOB: Mar 20, 1990, 30 y.o.   MRN: [id number]   Chief Complaint  Patient presents with  . Annual Exam   Subjective:    HPI The patient comes in today for a wellness visit.  A review of their health history was completed.  A review of medications was also completed.  Any needed refills; no  Eating habits: trying to eat good  Regular exercise: not lately  Specialist pt sees on regular basis: endocrinologist  Preventative health issues were discussed.   Additional concerns: Yeast infection   Pt had covid again and had to be out of work. Was eating badly to keep up with not getting dehydrated and in hospital.   No issues with breathing now. Getting up ever 3hrs at night with urinating.  dm2- running 250s-300s recently.  Seeing endocrinologist.  And they started on trulicy, pt still on glipizide, levemir, metformin. They are going to check labs in 3/22  Lipitor for cholesterol- doing okay with that med. Using shampoo ketoconazole 1%, but Saw eye doctor last year. Foot exam-  No sores ulcers, no burning pain in feet.  Seeing Endocrinologist for diabetes.  Medical History Melanie Mccoy has a past medical history of Anemia, Bartholin cyst, Chlamydia (01/2011), Diabetes mellitus without complication (HCC), Family history of anesthesia complication, and No pertinent past medical history.   Outpatient Encounter Medications as of 05/01/2020  Medication Sig  . fluconazole (DIFLUCAN) 150 MG tablet Take 1 tab p.o. once.  May repeat in 7 days.  Marland Kitchen ketoconazole (NIZORAL) 2 % shampoo Apply to skin topically daily. Lather and leave on skin for 5 mins for 2 wks on rash, then use 2x per week for 4 more weeks.  Marland Kitchen amLODipine (NORVASC) 5 MG tablet Take 1 tablet (5 mg total) by mouth in the morning.  Marland Kitchen atorvastatin (LIPITOR) 10 MG tablet Take 1 tablet (10 mg total) by mouth daily.  . Dulaglutide (TRULICITY) 1.5 MG/0.5ML SOPN Inject 1.5 mg into the skin  once a week.  Marland Kitchen glipiZIDE (GLUCOTROL XL) 10 MG 24 hr tablet TAKE 1 TABLET BY MOUTH EVERY MORNING  . glucose blood test strip Use as instructed  . insulin detemir (LEVEMIR FLEXTOUCH) 100 UNIT/ML FlexPen Inject 50 Units into the skin at bedtime.  . Insulin Pen Needle (BD PEN NEEDLE NANO 2ND GEN) 32G X 4 MM MISC Use 2x per day as directed.  . Lancets (ACCU-CHEK SOFT TOUCH) lancets Use as instructed  . lisinopril (ZESTRIL) 10 MG tablet Take 1 tablet (10 mg total) by mouth daily.  . metFORMIN (GLUCOPHAGE) 500 MG tablet Take 1 tablet (500 mg total) by mouth 2 (two) times daily with a meal.  . [DISCONTINUED] KETOCONAZOLE, TOPICAL, 1 % SHAM Apply to skin topically daily.  Lather and leave on skin for 5 mins for 2 wks on  rash, then use 2x per week for 4 more weeks.   No facility-administered encounter medications on file as of 05/01/2020.     Review of Systems  Constitutional: Negative for chills and fever.  HENT: Negative for congestion, rhinorrhea and sore throat.   Respiratory: Negative for cough, shortness of breath and wheezing.   Cardiovascular: Negative for chest pain and leg swelling.  Gastrointestinal: Negative for abdominal pain, diarrhea, nausea and vomiting.  Genitourinary: Negative for dysuria and frequency.       +vaginal itching   Musculoskeletal: Negative for arthralgias and back pain.  Skin: Negative for rash.  Neurological: Negative for dizziness, weakness  and headaches.     Vitals BP 126/84   Pulse 96   Temp (!) 97 F (36.1 C) (Oral)   Ht 5\' 4"  (1.626 m)   Wt 197 lb 12.8 oz (89.7 kg)   SpO2 99%   BMI 33.95 kg/m   Objective:   Physical Exam Vitals and nursing note reviewed. Exam conducted with a chaperone present.  Constitutional:      General: She is not in acute distress.    Appearance: Normal appearance. She is not ill-appearing.  HENT:     Head: Normocephalic and atraumatic.     Right Ear: Tympanic membrane, ear canal and external ear normal.     Left  Ear: Tympanic membrane, ear canal and external ear normal.     Nose: Nose normal.     Mouth/Throat:     Mouth: Mucous membranes are moist.     Pharynx: Oropharynx is clear.  Eyes:     Extraocular Movements: Extraocular movements intact.     Conjunctiva/sclera: Conjunctivae normal.     Pupils: Pupils are equal, round, and reactive to light.  Cardiovascular:     Rate and Rhythm: Normal rate and regular rhythm.     Pulses: Normal pulses.     Heart sounds: Normal heart sounds.  Pulmonary:     Effort: Pulmonary effort is normal.     Breath sounds: Normal breath sounds. No wheezing, rhonchi or rales.  Chest:  Breasts:     Right: Normal. No swelling, bleeding, inverted nipple, mass, nipple discharge, skin change, tenderness, axillary adenopathy or supraclavicular adenopathy.     Left: Normal. No swelling, bleeding, inverted nipple, mass, nipple discharge, skin change, tenderness, axillary adenopathy or supraclavicular adenopathy.    Abdominal:     General: Abdomen is flat. Bowel sounds are normal. There is no distension.     Palpations: Abdomen is soft. There is no mass.     Tenderness: There is no abdominal tenderness. There is no guarding or rebound.     Hernia: No hernia is present.  Genitourinary:    General: Normal vulva.     Vagina: No vaginal discharge.  Musculoskeletal:        General: Normal range of motion.     Cervical back: Normal range of motion.     Right lower leg: No edema.     Left lower leg: No edema.  Lymphadenopathy:     Upper Body:     Right upper body: No supraclavicular, axillary or pectoral adenopathy.     Left upper body: No supraclavicular, axillary or pectoral adenopathy.  Skin:    General: Skin is warm and dry.     Findings: No lesion or rash.  Neurological:     General: No focal deficit present.     Mental Status: She is alert and oriented to person, place, and time.     Cranial Nerves: No cranial nerve deficit.  Psychiatric:        Mood and  Affect: Mood normal.        Behavior: Behavior normal.        Thought Content: Thought content normal.        Judgment: Judgment normal.      Assessment and Plan   1. Well woman exam with routine gynecological exam - IGP,CtNg,AptimaHPV,rfx16/18,45  2. Hyperlipidemia associated with type 2 diabetes mellitus (HCC) - Lipid panel  3. Vaginal yeast infection   htn-stable. Controlled. Cont meds.  hld- will order labs.  Last labs tg were elevated.  slgithly elevated cholesterol and LDL. Cont meds.  dm2- uncontrolled.  Last a1c 10.4. in 11/21. cont with endocrinologist. Cont meds.  HM- ordered std testing with the pap.  F/u 64mo or prn.

## 2020-05-06 LAB — IGP,CTNG,APTIMAHPV,RFX16/18,45
Chlamydia, Nuc. Acid Amp: NEGATIVE
Gonococcus by Nucleic Acid Amp: NEGATIVE
HPV Aptima: NEGATIVE

## 2020-05-06 LAB — SPECIMEN STATUS REPORT

## 2020-05-07 ENCOUNTER — Other Ambulatory Visit: Payer: Self-pay

## 2020-05-07 ENCOUNTER — Encounter: Payer: 59 | Attending: Nurse Practitioner | Admitting: Nutrition

## 2020-05-07 VITALS — Ht 64.5 in | Wt 204.0 lb

## 2020-05-07 DIAGNOSIS — E1169 Type 2 diabetes mellitus with other specified complication: Secondary | ICD-10-CM | POA: Insufficient documentation

## 2020-05-07 DIAGNOSIS — I10 Essential (primary) hypertension: Secondary | ICD-10-CM | POA: Insufficient documentation

## 2020-05-07 DIAGNOSIS — E119 Type 2 diabetes mellitus without complications: Secondary | ICD-10-CM | POA: Diagnosis present

## 2020-05-07 DIAGNOSIS — E785 Hyperlipidemia, unspecified: Secondary | ICD-10-CM | POA: Diagnosis present

## 2020-05-07 NOTE — Patient Instructions (Signed)
  Call [facility] and share bs logs Eat meals on time Eat more baked and broiled foods Check blood sugars before breakfast and at bedtime Drink 100 oz of water Aim for FBS less than 150 and Bedtime less than 180 mg/dl.

## 2020-05-07 NOTE — Progress Notes (Signed)
Medical Nutrition Therapy  Appointment Start time:  [phone] Appointment End time:  1700 Dx 5 yrs Currently taking 50 levermir, Trulicity, metformin 500 BID, Glipizide 10 mg Changes made:  Had covid in January 2022 for the second time. Still struggling with food choices. Is better with portion sizes. Very busy at her job and working a new position. 250's in am. Bedtime: 300's. Mariah Milling, FNP. Just started taking Atotoravastin last week.  Saw Dr. Ladona Ridgel last week, but just started taking Atoravastin    Primary concerns today: getting blood sugars down, high cholesterol and  and losing weight Referral diagnosis: e11.8, e78.0 Preferred learning style:  no preference indicated Learning readiness: change in progress   NUTRITION ASSESSMENT   Anthropometrics  Clinical Medical Hx: HTN and Hyperlipidemai Medications: Levemeir 50 units, Trulicity, Metformin 500 mg BID, Glipizide Labs: FBS: 160-300  Bedtime: 240's.  Lab Results  Component Value Date   HGBA1C 10.4 (H) 01/29/2020   CMP Latest Ref Rng & Units 01/29/2020 10/24/2019 07/10/2019  Glucose 65 - 99 mg/dL 149(F) 026(V) 785(Y)  BUN 6 - 20 mg/dL 8 14 13   Creatinine 0.57 - 1.00 mg/dL 8.50 2.77  Sodium 134 - 144 mmol/L 139 139 139  Potassium 3.5 - 5.2 mmol/L 3.8 4.4 4.3  Chloride 96 - 106 mmol/L 99 101 111  CO2 20 - 29 mmol/L 22 21 15(L)  Calcium 8.7 - 10.2 mg/dL 9.4 9.6 4.12)  Total Protein 6.0 - 8.5 g/dL 7.0 6.6 6.9  Total Bilirubin 0.0 - 1.2 mg/dL 0.2 8.7(O 1.0  Alkaline Phos 44 - 121 IU/L 77 68 68  AST 0 - 40 IU/L 25 17 38  ALT 0 - 32 IU/L 49(H) 22 34   Lipid Panel     Component Value Date/Time   CHOL 218 (H) 01/29/2020 0926   TRIG 353 (H) 01/29/2020 0926   HDL 33 (L) 01/29/2020 0926   CHOLHDL 6.6 (H) 01/29/2020 0926   CHOLHDL 5.1 07/10/2019 0506   VLDL 32 07/10/2019 0506   LDLCALC 123 (H) 01/29/2020 0926   LABVLDL 62 (H) 01/29/2020 0926   Wt Readings from Last 3 Encounters:  05/01/20 197 lb 12.8 oz (89.7 kg)   04/02/20 201 lb (91.2 kg)  03/16/20 201 lb (91.2 kg)   Ht Readings from Last 3 Encounters:  05/01/20 5\' 4"  (1.626 m)  04/02/20 5\' 4"  (1.626 m)  03/16/20 5\' 4"  (1.626 m)   There is no height or weight on file to calculate BMI. @BMIFA @ Facility age limit for growth percentiles is 20 years. Facility age limit for growth percentiles is 20 years.   Notable Signs/Symptoms: Fatigue, increased thirst, frequent urination, blurry vision  Lifestyle & Dietary Hx MARRIED. She Has been working on more lower carb vegetables, more protien and less carbohydrates. She works a full time job and a part time job. Has a 25 yr old daughter. Motivated to improve her DM.   Estimated daily fluid intake: 100 oz Supplements: none Sleep: 4-7 hrs  Stress / self-care: jobs are stressing Current average weekly physical activity: 2 hours  24-Hr Dietary Recall First Meal:Turkey sandwich on bread with mustard, water Lunch: country style steak, broccoli and rice, water Snack Second Meal: Chicken peppers,onions, salad,  Snack: Third Meal: chicken with pepper/onions, baked potato soup, Snack:  Beverages: water  Estimated Energy Needs Calories: 1200-1400 Carbohydrate: 135g Protein: 90g Fat: 33g   NUTRITION DIAGNOSIS  NB-1.1 Food and nutrition-related knowledge deficit As related to Diabetes Type 2.  As evidenced by A1C 10.4%.  NUTRITION INTERVENTION  Nutrition education (E-1) on the following topics:  Nutrition and Diabetes education provided on My Plate, CHO counting, meal planning, portion sizes, timing of meals, avoiding snacks between meals unless having a low blood sugar, target ranges for A1C and blood sugars, signs/symptoms and treatment of hyper/hypoglycemia, monitoring blood sugars, taking medications as prescribed, benefits of exercising 30 minutes per day and prevention of complications of DM.  Handouts Provided Include  Goals set by patient  Call [facility] and share bs logs Eat meals on  time Eat more baked and broiled foods Check blood sugars before breakfast and at bedtime Drink 100 oz of water Aim for FBS less than 150 and Bedtime less than 180 mg/dl.   Work on portion [employer] and read foods label. Stick to 30-45 g carbs per meals Get FBS Less than 130 in am and less than 180 before bed Increase fiber in diet. Get A1C down to 7%    Learning Style & Readiness for Change Teaching method utilized: Visual & Auditory  Demonstrated degree of understanding via: Teach Back  Barriers to learning/adherence to lifestyle change: None  Goals Established by Pt Goals set by patient  Work on portion control Count carbs and read foods label. Stick to 30-45 g carbs per meals Get FBS Less than 130 in am and less than 180 before bed Increase fiber in diet. Get A1C down to 7%   MONITORING & EVALUATION Dietary intake, weekly physical activity, and blood sugars  in 1 month.  Next Steps  Patient is to start meal planning.Marland Kitchen

## 2020-05-08 ENCOUNTER — Ambulatory Visit: Payer: 59 | Admitting: Family Medicine

## 2020-05-08 ENCOUNTER — Encounter: Payer: Self-pay | Admitting: Family Medicine

## 2020-05-08 VITALS — BP 137/95 | HR 98 | Temp 106.0°F | Ht 64.5 in | Wt 201.0 lb

## 2020-05-08 DIAGNOSIS — N61 Mastitis without abscess: Secondary | ICD-10-CM | POA: Diagnosis not present

## 2020-05-08 MED ORDER — CEPHALEXIN 500 MG PO CAPS: 500.0000 mg | ORAL_CAPSULE | Freq: Two times a day (BID) | ORAL | 0 refills | Status: DC

## 2020-05-08 NOTE — Progress Notes (Signed)
Patient ID: Melanie Mccoy, female    DOB: Apr 07, 1990, 30 y.o.   MRN: [id number]   Chief Complaint  Patient presents with  . Breast Pain   Subjective:    HPI   Pt left breast swollen, tender and painful. Began Wednesday. Redness and heat yesterday. Today not red or hot but is still somewhat swollen. Pt took Difulcan Tuesday and symptoms started Wednesday late evening. Pt also began Atorvastatin Tuesday also.   Left breast- Sore, then swelling, swelling.  Felt engorged feeling. Wed night, 2 days ago. Felt warmth.  Swelling improved.  Sensitive to touch. No injury to breast.  No rash or discharge from breast.  No new soaps or lotions. Mild itchiness. No direct injury or trauma to the breast.  Mother had this and had infection in breast.   Did diflucan tab on Tuesday. 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  Tried this tab in past.    Medical History Chatara has a past medical history of Anemia, Bartholin cyst, Chlamydia (01/2011), Diabetes mellitus without complication (HCC), Family history of anesthesia complication, and No pertinent past medical history.   Outpatient Encounter Medications as of 05/08/2020  Medication Sig  . amLODipine (NORVASC) 5 MG tablet Take 1 tablet (5 mg total) by mouth in the morning.  Marland Kitchen atorvastatin (LIPITOR) 10 MG tablet Take 1 tablet (10 mg total) by mouth daily.  . cephALEXin (KEFLEX) 500 MG capsule Take 1 capsule (500 mg total) by mouth 2 (two) times daily.  . Dulaglutide (TRULICITY) 1.5 MG/0.5ML SOPN Inject 1.5 mg into the skin once a week.  . fluconazole (DIFLUCAN) 150 MG tablet Take 1 tab p.o. once.  May repeat in 7 days.  Marland Kitchen glipiZIDE (GLUCOTROL XL) 10 MG 24 hr tablet TAKE 1 TABLET BY MOUTH EVERY MORNING  . glucose blood test strip Use as instructed  . insulin detemir (LEVEMIR FLEXTOUCH) 100 UNIT/ML FlexPen Inject 50 Units into the skin at bedtime.  . Insulin Pen Needle (BD PEN NEEDLE NANO 2ND GEN) 32G X 4 MM MISC Use 2x per day as directed.  Marland Kitchen  ketoconazole (NIZORAL) 2 % shampoo Apply to skin topically daily. Lather and leave on skin for 5 mins for 2 wks on rash, then use 2x per week for 4 more weeks.  . Lancets (ACCU-CHEK SOFT TOUCH) lancets Use as instructed  . lisinopril (ZESTRIL) 10 MG tablet Take 1 tablet (10 mg total) by mouth daily.  . metFORMIN (GLUCOPHAGE) 500 MG tablet Take 1 tablet (500 mg total) by mouth 2 (two) times daily with a meal.   No facility-administered encounter medications on file as of 05/08/2020.     Review of Systems  Constitutional: Negative for chills and fever.  HENT: Negative for congestion, rhinorrhea and sore throat.   Respiratory: Negative for cough, shortness of breath and wheezing.   Cardiovascular: Negative for chest pain and leg swelling.  Gastrointestinal: Negative for abdominal pain, diarrhea, nausea and vomiting.  Genitourinary: Negative for dysuria and frequency.  Musculoskeletal: Negative for arthralgias and back pain.  Skin: Negative for rash.       +left breast rash/pain  Neurological: Negative for dizziness, weakness and headaches.     Vitals BP (!) 137/95   Pulse 98   Temp (!) 106 F (41.1 C)   Ht 5' 4.5" (1.638 m)   Wt 201 lb (91.2 kg)   SpO2 97%   BMI 33.97 kg/m   Objective:   Physical Exam Vitals and nursing note reviewed.  Constitutional:  Appearance: Normal appearance.  HENT:     Head: Normocephalic and atraumatic.     Nose: Nose normal.     Mouth/Throat:     Mouth: Mucous membranes are moist.     Pharynx: Oropharynx is clear.  Eyes:     Extraocular Movements: Extraocular movements intact.     Conjunctiva/sclera: Conjunctivae normal.     Pupils: Pupils are equal, round, and reactive to light.  Cardiovascular:     Rate and Rhythm: Normal rate and regular rhythm.     Pulses: Normal pulses.     Heart sounds: Normal heart sounds.  Pulmonary:     Effort: Pulmonary effort is normal.     Breath sounds: Normal breath sounds. No wheezing, rhonchi or rales.   Chest:  Breasts:     Right: No swelling or bleeding.     Left: Skin change and tenderness present. No swelling, bleeding, inverted nipple, mass or nipple discharge.     Musculoskeletal:        General: Normal range of motion.     Right lower leg: No edema.     Left lower leg: No edema.  Skin:    General: Skin is warm and dry.     Findings: No lesion or rash.  Neurological:     General: No focal deficit present.     Mental Status: She is alert and oriented to person, place, and time.  Psychiatric:        Mood and Affect: Mood normal.        Behavior: Behavior normal.      Assessment and Plan   1. Cellulitis of left breast - cephALEXin (KEFLEX) 500 MG capsule; Take 1 capsule (500 mg total) by mouth 2 (two) times daily.  Dispense: 14 capsule; Refill: 0    Pt to call back in 1 wk if not better or if worsening fever, nipple discharge or mass.   Keflex for 1 wk.  Tylenol or ibuprofen prn pain.  Return if symptoms worsen or fail to improve.

## 2020-05-11 ENCOUNTER — Telehealth: Payer: 59 | Admitting: Nutrition

## 2020-06-04 ENCOUNTER — Encounter: Payer: Self-pay | Admitting: Nutrition

## 2020-06-23 ENCOUNTER — Encounter: Payer: Self-pay | Admitting: Nutrition

## 2020-06-23 ENCOUNTER — Encounter: Payer: 59 | Attending: Nurse Practitioner | Admitting: Nutrition

## 2020-06-23 ENCOUNTER — Other Ambulatory Visit: Payer: Self-pay

## 2020-06-23 ENCOUNTER — Encounter: Payer: Self-pay | Admitting: Nurse Practitioner

## 2020-06-23 ENCOUNTER — Ambulatory Visit: Payer: 59 | Admitting: Nurse Practitioner

## 2020-06-23 VITALS — BP 132/89 | HR 87 | Ht 64.5 in | Wt 204.2 lb

## 2020-06-23 DIAGNOSIS — Z794 Long term (current) use of insulin: Secondary | ICD-10-CM | POA: Diagnosis not present

## 2020-06-23 DIAGNOSIS — E1169 Type 2 diabetes mellitus with other specified complication: Secondary | ICD-10-CM

## 2020-06-23 DIAGNOSIS — E119 Type 2 diabetes mellitus without complications: Secondary | ICD-10-CM

## 2020-06-23 DIAGNOSIS — E785 Hyperlipidemia, unspecified: Secondary | ICD-10-CM | POA: Insufficient documentation

## 2020-06-23 DIAGNOSIS — I10 Essential (primary) hypertension: Secondary | ICD-10-CM

## 2020-06-23 LAB — POCT GLYCOSYLATED HEMOGLOBIN (HGB A1C): HbA1c, POC (controlled diabetic range): 11.9 % — AB (ref 0.0–7.0)

## 2020-06-23 MED ORDER — LEVEMIR FLEXTOUCH 100 UNIT/ML ~~LOC~~ SOPN
65.0000 [IU] | PEN_INJECTOR | Freq: Every day | SUBCUTANEOUS | 3 refills | Status: DC
Start: 2020-06-23 — End: 2020-07-09

## 2020-06-23 NOTE — Patient Instructions (Signed)

## 2020-06-23 NOTE — Patient Instructions (Signed)
Goals  Take medications as prescribed Use Eatwise app for reminder of meals and medication Drink 100 oz of water Eat 5 oz of protein with lunch and dinner and 2 oz at breakfast. Increase lower carb vegetables. Get FBS less than 150 and less than 180 before bedtime.

## 2020-06-23 NOTE — Progress Notes (Signed)
Endocrinology Follow Up Visit       06/23/2020, 9:00 AM   Subjective:    Patient ID: Melanie Mccoy, female    DOB: Nov 14, 1990.  Melanie Mccoy is being seen in follow up after being seen in consultation for management of currently uncontrolled symptomatic diabetes requested by  Annalee Genta, DO.   Past Medical History:  Diagnosis Date  . Anemia    slightly anemic, iron supplements every 2-3 days  . Bartholin cyst   . Chlamydia 01/2011  . Diabetes mellitus without complication (HCC)   . Family history of anesthesia complication    grandmother had spinal with nerve damage  . No pertinent past medical history     Past Surgical History:  Procedure Laterality Date  . CESAREAN SECTION N/A 11/22/2012   Procedure: PRIMARY CESAREAN SECTION;  Surgeon: Esmeralda Arthur, MD;  Location: [facility];  Service: Obstetrics;  Laterality: N/A;  . CYST REMOVAL TRUNK  2005    Social History   Socioeconomic History  . Marital status: Single    Spouse name: Not on file  . Number of children: Not on file  . Years of education: Not on file  . Highest education level: Not on file  Occupational History  . Not on file  Tobacco Use  . Smoking status: Never Smoker  . Smokeless tobacco: Never Used  Vaping Use  . Vaping Use: Never used  Substance and Sexual Activity  . Alcohol use: No    Alcohol/week: 0.0 standard drinks  . Drug use: No  . Sexual activity: Yes  Other Topics Concern  . Not on file  Social History Narrative  . Not on file   Social Determinants of Health   Financial Resource Strain: Not on file  Food Insecurity: Not on file  Transportation Needs: Not on file  Physical Activity: Not on file  Stress: Not on file  Social Connections: Not on file    Family History  Problem Relation Age of Onset  . Cancer Other   . Heart disease Other   . Diabetes Other   . Diabetes Mother   . Diabetes Father    . Diabetes Maternal Grandfather     Outpatient Encounter Medications as of 06/23/2020  Medication Sig  . amLODipine (NORVASC) 5 MG tablet Take 1 tablet (5 mg total) by mouth in the morning.  Marland Kitchen atorvastatin (LIPITOR) 10 MG tablet Take 1 tablet (10 mg total) by mouth daily.  . Dulaglutide (TRULICITY) 1.5 MG/0.5ML SOPN Inject 1.5 mg into the skin once a week.  Marland Kitchen glipiZIDE (GLUCOTROL XL) 10 MG 24 hr tablet TAKE 1 TABLET BY MOUTH EVERY MORNING  . glucose blood test strip Use as instructed  . Insulin Pen Needle (BD PEN NEEDLE NANO 2ND GEN) 32G X 4 MM MISC Use 2x per day as directed.  Marland Kitchen ketoconazole (NIZORAL) 2 % shampoo Apply to skin topically daily. Lather and leave on skin for 5 mins for 2 wks on rash, then use 2x per week for 4 more weeks.  . Lancets (ACCU-CHEK SOFT TOUCH) lancets Use as instructed  . lisinopril (ZESTRIL) 10 MG tablet Take 1 tablet (10 mg total) by mouth daily.  Marland Kitchen  metFORMIN (GLUCOPHAGE) 500 MG tablet Take 1 tablet (500 mg total) by mouth 2 (two) times daily with a meal.  . [DISCONTINUED] insulin detemir (LEVEMIR FLEXTOUCH) 100 UNIT/ML FlexPen Inject 50 Units into the skin at bedtime.  . insulin detemir (LEVEMIR FLEXTOUCH) 100 UNIT/ML FlexPen Inject 65 Units into the skin at bedtime.  . [DISCONTINUED] cephALEXin (KEFLEX) 500 MG capsule Take 1 capsule (500 mg total) by mouth 2 (two) times daily.  . [DISCONTINUED] fluconazole (DIFLUCAN) 150 MG tablet Take 1 tab p.o. once.  May repeat in 7 days.   No facility-administered encounter medications on file as of 06/23/2020.    ALLERGIES: Allergies  Allergen Reactions  . Bee Venom Swelling    Progressively worse with each sting  . Ibuprofen Hives  . Nickel Rash    VACCINATION STATUS: Immunization History  Administered Date(s) Administered  . Tdap 06/26/2006    Diabetes She presents for her follow-up diabetic visit. She has type 2 diabetes mellitus. Onset time: She was diagnosed at approx age of 35. Her disease course has  been worsening. There are no hypoglycemic associated symptoms. Associated symptoms include fatigue, polydipsia and polyuria. Pertinent negatives for diabetes include no blurred vision and no polyphagia. There are no hypoglycemic complications. Symptoms are worsening. There are no diabetic complications. Risk factors for coronary artery disease include diabetes mellitus, dyslipidemia, hypertension, obesity, sedentary lifestyle and family history. Current diabetic treatment includes insulin injections and oral agent (dual therapy). She is compliant with treatment most of the time. Her weight is fluctuating minimally. She is following a generally healthy diet. When asked about meal planning, she reported none. She has not had a previous visit with a dietitian. She participates in exercise intermittently. Her home blood glucose trend is increasing steadily. Her breakfast blood glucose range is generally >200 mg/dl. Her overall blood glucose range is >200 mg/dl. (She presents today with her meter and logs showing inconsistent monitoring pattern and grossly above target fasting glycemic profile.  Her POCT A1c today is 11.9%, worsening from last visit of 10.4%.  She reports being under a great amount of stress lately which may have contributed to her loss of control.  There are no episodes of hypoglycemia noted.) An ACE inhibitor/angiotensin II receptor blocker is being taken. She does not see a podiatrist.Eye exam is current.  Hypertension This is a chronic problem. The current episode started more than 1 year ago. The problem has been gradually improving since onset. The problem is controlled. Pertinent negatives include no blurred vision. There are no associated agents to hypertension. Risk factors for coronary artery disease include diabetes mellitus, dyslipidemia, obesity and sedentary lifestyle. Past treatments include calcium channel blockers and ACE inhibitors. The current treatment provides mild improvement.  Compliance problems include diet and exercise.   Hyperlipidemia This is a chronic problem. The current episode started more than 1 year ago. The problem is uncontrolled. Recent lipid tests were reviewed and are high. Exacerbating diseases include diabetes and obesity. There are no known factors aggravating her hyperlipidemia. Current antihyperlipidemic treatment includes statins. The current treatment provides mild improvement of lipids. Compliance problems include adherence to diet and adherence to exercise.  Risk factors for coronary artery disease include diabetes mellitus, dyslipidemia, hypertension, obesity and a sedentary lifestyle.    Review of systems  Constitutional: + minimally fluctuating body weight,  current Body mass index is 34.51 kg/m. , + fatigue, no subjective hyperthermia, no subjective hypothermia Eyes: no blurry vision, no xerophthalmia ENT: no sore throat, no nodules palpated in throat,  no dysphagia/odynophagia, no hoarseness Cardiovascular: no chest pain, no shortness of breath, no palpitations, no leg swelling Respiratory: no cough, no shortness of breath Gastrointestinal: no nausea/vomiting/diarrhea Musculoskeletal: no muscle/joint aches Skin: no rashes, no hyperemia Neurological: no tremors, no numbness, no tingling, no dizziness Psychiatric: no depression, no anxiety, reports increase in stress levels lately   Objective:     BP 132/89 (BP Location: Left Arm)   Pulse 87   Ht 5' 4.5" (1.638 m)   Wt 204 lb 3.2 oz (92.6 kg)   BMI 34.51 kg/m   Wt Readings from Last 3 Encounters:  06/23/20 204 lb 3.2 oz (92.6 kg)  05/08/20 201 lb (91.2 kg)  05/07/20 204 lb (92.5 kg)    BP Readings from Last 3 Encounters:  06/23/20 132/89  05/08/20 (!) 137/95  05/01/20 126/84     Physical Exam- Limited  Constitutional:  Body mass index is 34.51 kg/m. , not in acute distress, normal state of mind Eyes:  EOMI, no exophthalmos Neck: Supple Cardiovascular: RRR, no  murmers, rubs, or gallops, no edema Respiratory: Adequate breathing efforts, no crackles, rales, rhonchi, or wheezing Musculoskeletal: no gross deformities, strength intact in all four extremities, no gross restriction of joint movements Skin:  no rashes, no hyperemia Neurological: no tremor with outstretched hands   Foot exam:   No rashes, ulcers, cuts, calluses, onychodystrophy.   Good pulses bilat.  Good sensation to 10 g monofilament bilat.   POCT ABI Results 06/23/20   Right ABI:  1.35      Left ABI:  1.35  Right leg systolic / diastolic: 178/107 mmHg Left leg systolic / diastolic: 178/104 mmHg  Arm systolic / diastolic: 132/89 mmHG  Detailed report will be scanned into patient chart.    CMP ( most recent) CMP     Component Value Date/Time   NA 139 01/29/2020 0926   K 3.8 01/29/2020 0926   CL 99 01/29/2020 0926   CO2 22 01/29/2020 0926   GLUCOSE 194 (H) 01/29/2020 0926   GLUCOSE 318 (H) 07/10/2019 0506   BUN 8 01/29/2020 0926   CREATININE 0.70 01/29/2020 0926   CREATININE 0.77 01/14/2016 1514   CALCIUM 9.4 01/29/2020 0926   PROT 7.0 01/29/2020 0926   ALBUMIN 4.4 01/29/2020 0926   AST 25 01/29/2020 0926   ALT 49 (H) 01/29/2020 0926   ALKPHOS 77 01/29/2020 0926   BILITOT 0.2 01/29/2020 0926   GFRNONAA 118 01/29/2020 0926   GFRNONAA >89 01/14/2016 1514   GFRAA 136 01/29/2020 0926   GFRAA >89 01/14/2016 1514     Diabetic Labs (most recent): Lab Results  Component Value Date   HGBA1C 11.9 (A) 06/23/2020   HGBA1C 10.4 (H) 01/29/2020   HGBA1C 10.2 (H) 10/24/2019     Lipid Panel ( most recent) Lipid Panel     Component Value Date/Time   CHOL 218 (H) 01/29/2020 0926   TRIG 353 (H) 01/29/2020 0926   HDL 33 (L) 01/29/2020 0926   CHOLHDL 6.6 (H) 01/29/2020 0926   CHOLHDL 5.1 07/10/2019 0506   VLDL 32 07/10/2019 0506   LDLCALC 123 (H) 01/29/2020 0926   LABVLDL 62 (H) 01/29/2020 0926      Lab Results  Component Value Date   TSH 2.00 01/14/2016            Assessment & Plan:   1) Type 2 diabetes mellitus without complication, without long-term current use of insulin (HCC)  - Melanie Mccoy has currently uncontrolled symptomatic type 2 DM since  30 years of age.   Recent labs reviewed.  She presents today with her meter and logs showing inconsistent monitoring pattern and grossly above target fasting glycemic profile.  Her POCT A1c today is 11.9%, worsening from last visit of 10.4%.  She reports being under a great amount of stress lately which may have contributed to her loss of control.  There are no episodes of hypoglycemia noted.  - I had a long discussion with her about the progressive nature of diabetes and the pathology behind its complications.  -She does not currently have any complications from diabetes but she remains at a high risk for more acute and chronic complications which include CAD, CVA, CKD, retinopathy, and neuropathy. These are all discussed in detail with her.  - Nutritional counseling repeated at each appointment due to patients tendency to fall back in to old habits.  - The patient admits there is a room for improvement in their diet and drink choices. -  Suggestion is made for the patient to avoid simple carbohydrates from their diet including Cakes, Sweet Desserts / Pastries, Ice Cream, Soda (diet and regular), Sweet Tea, Candies, Chips, Cookies, Sweet Pastries,  Store Bought Juices, Alcohol in Excess of  1-2 drinks a day, Artificial Sweeteners, Coffee Creamer, and "Sugar-free" Products. This will help patient to have stable blood glucose profile and potentially avoid unintended weight gain.   - I encouraged the patient to switch to  unprocessed or minimally processed complex starch and increased protein intake (animal or plant source), fruits, and vegetables.   - Patient is advised to stick to a routine mealtimes to eat 3 meals  a day and avoid unnecessary snacks ( to snack only to correct  hypoglycemia).  - she has been scheduled with Norm SaltPenny Crumpton, RDN, CDE for diabetes education.  - I have approached her with the following individualized plan to manage  her diabetes and patient agrees:   -Given her persistent hyperglycemia and loss of control, she will tolerate increase in her Levemir to 65 units SQ daily at bedtime.  She is advised to continue Trulicity 1.5 mg SQ weekly, continue Metformin 500 mg po twice daily with meals, and continue Glipizide 10 mg XL daily with breakfast for now.  -she is encouraged to consistently monitor blood glucose twice daily, before breakfast and before bed, and to call the clinic if she has readings less than 70 or greater than 300 for 3 tests in a row.  - she is warned not to take insulin without proper monitoring per orders. - Adjustment parameters are given to her for hypo and hyperglycemia in writing.  - Specific targets for  A1c;  LDL, HDL,  and Triglycerides were discussed with the patient.  2) Blood Pressure /Hypertension:  Her blood pressure is controlled to target.  She is advised to continue Amlodipine 5 mg po daily, and Lisinopril 10 mg po daily.   3) Lipids/Hyperlipidemia:    Her most recent lipid panel from 01/29/20 shows uncontrolled LDL of 123 and elevated triglycerides of 353.  She is advised to continue Lipitor 10 mg po daily at bedtime.  Side effects and precautions discussed with her.  She was advised to avoid fried foods and butter.    4)  Weight/Diet:  her Body mass index is 34.51 kg/m.  - clearly complicating her diabetes care.   she is a candidate for weight loss. I discussed with her the fact that loss of 5 - 10% of her  current body weight will  have the most impact on her diabetes management.  Exercise, and detailed carbohydrates information provided  -  detailed on discharge instructions.  5) Chronic Care/Health Maintenance: -she is on ACEI/ARB and Statin medications and is encouraged to initiate and continue to  follow up with Ophthalmology, Dentist,  Podiatrist at least yearly or according to recommendations, and advised to stay away from smoking. I have recommended yearly flu vaccine and pneumonia vaccine at least every 5 years; moderate intensity exercise for up to 150 minutes weekly; and  sleep for at least 7 hours a day.  - she is  advised to maintain close follow up with Annalee Genta, DO for primary care needs, as well as her other providers for optimal and coordinated care.     I spent 37 minutes in the care of the patient today including review of labs from CMP, Lipids, Thyroid Function, Hematology (current and previous including abstractions from other facilities); face-to-face time discussing  her blood glucose readings/logs, discussing hypoglycemia and hyperglycemia episodes and symptoms, medications doses, her options of short and long term treatment based on the latest standards of care / guidelines;  discussion about incorporating lifestyle medicine;  and documenting the encounter.    Please refer to Patient Instructions for Blood Glucose Monitoring and Insulin/Medications Dosing Guide"  in media tab for additional information. Please  also refer to " Patient Self Inventory" in the Media  tab for reviewed elements of pertinent patient history.  Gladstone Lighter Sams participated in the discussions, expressed understanding, and voiced agreement with the above plans.  All questions were answered to her satisfaction. she is encouraged to contact clinic should she have any questions or concerns prior to her return visit.  Follow up plan: - Return in about 3 months (around 09/22/2020) for Diabetes follow up with A1c in office, Previsit labs, Bring glucometer and logs.  [name], [facility] [address] [place], [place] [address] Phone: [phone] Fax: [phone]  06/23/2020, 9:00 AM

## 2020-06-23 NOTE — Progress Notes (Signed)
Medical Nutrition Therapy  Appointment Start time:  0900  Appointment End time:  0930 Dx 5 yrs Saw [name], FNP today. Increased.Levemeir to 65 units.  Increased Levermir to 65 units daily , adding Trulicity, and continues metformin 500 BID, Glipizide 10 mg Changes made: not many changes made. Work schedule is stressful.  Referral diagnosis: e11.8, e78.0 Preferred learning style:  no preference indicated Learning readiness: change in progress   NUTRITION ASSESSMENT   Anthropometrics  Wt Readings from Last 3 Encounters:  06/23/20 204 lb 3.2 oz (92.6 kg)  05/08/20 201 lb (91.2 kg)  05/07/20 204 lb (92.5 kg)   Ht Readings from Last 3 Encounters:  06/23/20 5' 4.5" (1.638 m)  05/08/20 5' 4.5" (1.638 m)  05/07/20 5' 4.5" (1.638 m)   There is no height or weight on file to calculate BMI. @BMIFA @ Facility age limit for growth percentiles is 20 years. Facility age limit for growth percentiles is 20 years. Clinical Medical Hx: HTN and Hyperlipidemai Medications: Levemeir 50 units, Trulicity, Metformin 500 mg BID, Glipizide Labs: FBS: 160-300  Bedtime: 240's.  Lab Results  Component Value Date   HGBA1C 11.9 (A) 06/23/2020   CMP Latest Ref Rng & Units 01/29/2020 10/24/2019 07/10/2019  Glucose 65 - 99 mg/dL 09/09/2019) 885(O) 277(A)  BUN 6 - 20 mg/dL 8 14 13   Creatinine 0.57 - 1.00 mg/dL 128(N 8.67  Sodium 134 - 144 mmol/L 139 139 139  Potassium 3.5 - 5.2 mmol/L 3.8 4.4 4.3  Chloride 96 - 106 mmol/L 99 101 111  CO2 20 - 29 mmol/L 22 21 15(L)  Calcium 8.7 - 10.2 mg/dL 9.4 9.6 6.72)  Total Protein 6.0 - 8.5 g/dL 7.0 6.6 6.9  Total Bilirubin 0.0 - 1.2 mg/dL 0.2 0.94 1.0  Alkaline Phos 44 - 121 IU/L 77 68 68  AST 0 - 40 IU/L 25 17 38  ALT 0 - 32 IU/L 49(H) 22 34   Lipid Panel     Component Value Date/Time   CHOL 218 (H) 01/29/2020 0926   TRIG 353 (H) 01/29/2020 0926   HDL 33 (L) 01/29/2020 0926   CHOLHDL 6.6 (H) 01/29/2020 0926   CHOLHDL 5.1 07/10/2019 0506   VLDL 32  07/10/2019 0506   LDLCALC 123 (H) 01/29/2020 0926   LABVLDL 62 (H) 01/29/2020 0926   Wt Readings from Last 3 Encounters:  06/23/20 204 lb 3.2 oz (92.6 kg)  05/08/20 201 lb (91.2 kg)  05/07/20 204 lb (92.5 kg)   Ht Readings from Last 3 Encounters:  06/23/20 5' 4.5" (1.638 m)  05/08/20 5' 4.5" (1.638 m)  05/07/20 5' 4.5" (1.638 m)   There is no height or weight on file to calculate BMI. @BMIFA @ Facility age limit for growth percentiles is 20 years. Facility age limit for growth percentiles is 20 years.   Notable Signs/Symptoms: Fatigue, increased thirst, frequent urination, blurry vision  Lifestyle & Dietary Hx Seh works 2 jobs and life is very stressful. Not eating meals on time and not taking insulin as prescribed. Has slipped into old habits.  Estimated daily fluid intake: 100 oz Supplements: none Sleep: 4-7 hrs  Stress / self-care: jobs are stressing Current average weekly physical activity: 2 hours  24-Hr Dietary Recall Eats randomly between her first shift job and second shift job.  Estimated Energy Needs Calories: 1200-1400 Carbohydrate: 135g Protein: 90g Fat: 33g   NUTRITION DIAGNOSIS  NB-1.1 Food and nutrition-related knowledge deficit As related to Diabetes Type 2.  As evidenced by A1C 10.4%.  NUTRITION INTERVENTION  Nutrition education (E-1) on the following topics:  Nutrition and Diabetes education provided on My Plate, CHO counting, meal planning, portion sizes, timing of meals, avoiding snacks between meals unless having a low blood sugar, target ranges for A1C and blood sugars, signs/symptoms and treatment of hyper/hypoglycemia, monitoring blood sugars, taking medications as prescribed, benefits of exercising 30 minutes per day and prevention of complications of DM.  Handouts Provided Include  MyPlate    Learning Style & Readiness for Change Teaching method utilized: Visual & Auditory  Demonstrated degree of understanding via: Teach Back   Barriers to learning/adherence to lifestyle change: None  Goals Established by Pt   Take medications as prescribed Use Eatwise app for reminder of meals and medication Drink 100 oz of water Eat 5 oz of protein with lunch and dinner and 2 oz at breakfast. Increase lower carb vegetables. Get FBS less than 150 and less than 180 before bedtime. Increase Levemeir to 65 units daily Take Trulicity weekly.    MONITORING & EVALUATION Dietary intake, weekly physical activity, and blood sugars  in 1 month. Recommend meal time insulin if BS are not improved by next visit. Next Steps  Patient is to start meal planning.Marland Kitchen

## 2020-07-09 ENCOUNTER — Telehealth: Payer: Self-pay | Admitting: Nurse Practitioner

## 2020-07-09 ENCOUNTER — Other Ambulatory Visit: Payer: Self-pay | Admitting: Family Medicine

## 2020-07-09 DIAGNOSIS — E119 Type 2 diabetes mellitus without complications: Secondary | ICD-10-CM

## 2020-07-09 MED ORDER — LEVEMIR FLEXTOUCH 100 UNIT/ML ~~LOC~~ SOPN: 65.0000 [IU] | PEN_INJECTOR | Freq: Every day | SUBCUTANEOUS | 0 refills | Status: DC

## 2020-07-09 MED ORDER — NORGESTIMATE-ETH ESTRADIOL 0.25-35 MG-MCG PO TABS: 1.0000 | ORAL_TABLET | Freq: Every day | ORAL | 11 refills | Status: DC

## 2020-07-09 MED ORDER — TRULICITY 1.5 MG/0.5ML ~~LOC~~ SOAJ: 1.5000 mg | SUBCUTANEOUS | 1 refills | Status: DC

## 2020-07-09 MED ORDER — GLIPIZIDE ER 10 MG PO TB24
ORAL_TABLET | ORAL | 0 refills | Status: DC
Start: 2020-07-09 — End: 2020-09-30

## 2020-07-09 NOTE — Telephone Encounter (Signed)
Left message to return call 

## 2020-07-09 NOTE — Telephone Encounter (Signed)
And what is the name of the medication. Thx. Dr. Ladona Ridgel Did she used to see gyn?

## 2020-07-09 NOTE — Telephone Encounter (Signed)
Rxs sent

## 2020-07-09 NOTE — Telephone Encounter (Signed)
Pt is calling and requesting a refill on her medications. She states they are not showing the levemir that was called in April.   insulin detemir (LEVEMIR FLEXTOUCH) 100 UNIT/ML FlexPen   glipiZIDE (GLUCOTROL XL) 10 MG 24 hr tablet    Dulaglutide (TRULICITY) 1.5 MG/0.5ML [facility]     [facility] [phone] - , [place] - [address] AT [facility] [name] Phone:  [phone]  Fax:  [phone]

## 2020-07-09 NOTE — Telephone Encounter (Signed)
Patient was seen 05/01/20 for physical and her birthcontrol was not called in ethynylestradiol. [facility] [address]

## 2020-07-20 ENCOUNTER — Encounter: Payer: 59 | Attending: Nurse Practitioner | Admitting: Nutrition

## 2020-07-20 ENCOUNTER — Encounter: Payer: Self-pay | Admitting: Nutrition

## 2020-07-20 DIAGNOSIS — I10 Essential (primary) hypertension: Secondary | ICD-10-CM | POA: Diagnosis not present

## 2020-07-20 DIAGNOSIS — E1169 Type 2 diabetes mellitus with other specified complication: Secondary | ICD-10-CM | POA: Diagnosis present

## 2020-07-20 DIAGNOSIS — E119 Type 2 diabetes mellitus without complications: Secondary | ICD-10-CM | POA: Insufficient documentation

## 2020-07-20 DIAGNOSIS — Z789 Other specified health status: Secondary | ICD-10-CM

## 2020-07-20 DIAGNOSIS — E785 Hyperlipidemia, unspecified: Secondary | ICD-10-CM | POA: Diagnosis present

## 2020-07-20 DIAGNOSIS — Z7289 Other problems related to lifestyle: Secondary | ICD-10-CM | POA: Insufficient documentation

## 2020-07-20 DIAGNOSIS — E282 Polycystic ovarian syndrome: Secondary | ICD-10-CM | POA: Diagnosis present

## 2020-07-20 NOTE — Patient Instructions (Signed)
  Goals Established by Pt  Try Protein shake or Glucerna for breakfast. Try using Crockpot meals for lunch weekly Take medications as prescribed. Cut out alcohol. Eat 5 oz of protein with lunch and dinner and 2 oz at breakfast. Increase lower carb vegetables. Get FBS less than 150 and less than 180 before bedtime. Increase Levemeir to 65 units daily Take Trulicity weekly.

## 2020-07-20 NOTE — Progress Notes (Signed)
Medical Nutrition Therapy  Follow up Appointment Start time:  [phone]  Appointment End time: 0915 Dx 5 yrs  Ago. Sees  [name],FNP At [facility] End[facility]  Has gotten a  medication box and that his helping to remind her to take her medications. She is using the eatwise app also.  Currently taking Levermir to 65 units daily, Trulicity weekly, metformin 500 BID and Glipizide 10 mg Changes made: Hasn't made a lot of changes. She notes she is trying to eat more vegetables. Drinks 100 oz of water. Still skips breakfast and lunch often.  Drinks about a quart of hard liquor weekly. Willing to stop drinking alcohol. Doesn't drink beer.  FBS: 200-250's in am. No change since last visit. Bedtime: 160-180s. Still has a lot of stress with both her jobs.  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   Has 1 kid who is 10 yrs old. Gma takes care of her while she works when she isn't in school.   Referral diagnosis: e11.8, e78.0 Preferred learning style:  no preference indicated Learning readiness: change in progress   NUTRITION ASSESSMENT   Anthropometrics  Wt Readings from Last 3 Encounters:  06/23/20 204 lb 3.2 oz (92.6 kg)  05/08/20 201 lb (91.2 kg)  05/07/20 204 lb (92.5 kg)   Ht Readings from Last 3 Encounters:  06/23/20 5' 4.5" (1.638 m)  05/08/20 5' 4.5" (1.638 m)  05/07/20 5' 4.5" (1.638 m)   There is no height or weight on file to calculate BMI. @BMIFA @ Facility age limit for growth percentiles is 20 years. Facility age limit for growth percentiles is 20 years. Clinical Medical Hx: HTN and Hyperlipidemai Medications: Levemeir 50 units, Trulicity, Metformin 500 mg BID, Glipizide Labs: FBS: 160-300  Bedtime: 240's.  Lab Results  Component Value Date   HGBA1C 11.9 (A) 06/23/2020   CMP Latest Ref Rng & Units 01/29/2020 10/24/2019 07/10/2019  Glucose 65 - 99 mg/dL 09/09/2019) 696(V) 893(Y)  BUN 6 - 20 mg/dL 8 14 13    Creatinine 0.57 - 1.00 mg/dL 101(B 5.10  Sodium 134 - 144 mmol/L 139 139 139  Potassium 3.5 - 5.2 mmol/L 3.8 4.4 4.3  Chloride 96 - 106 mmol/L 99 101 111  CO2 20 - 29 mmol/L 22 21 15(L)  Calcium 8.7 - 10.2 mg/dL 9.4 9.6 2.58)  Total Protein 6.0 - 8.5 g/dL 7.0 6.6 6.9  Total Bilirubin 0.0 - 1.2 mg/dL 0.2 5.27 1.0  Alkaline Phos 44 - 121 IU/L 77 68 68  AST 0 - 40 IU/L 25 17 38  ALT 0 - 32 IU/L 49(H) 22 34   Lipid Panel     Component Value Date/Time   CHOL 218 (H) 01/29/2020 0926   TRIG 353 (H) 01/29/2020 0926   HDL 33 (L) 01/29/2020 0926   CHOLHDL 6.6 (H) 01/29/2020 0926   CHOLHDL 5.1 07/10/2019 0506   VLDL 32 07/10/2019 0506   LDLCALC 123 (H) 01/29/2020 0926   LABVLDL 62 (H) 01/29/2020 0926   Wt Readings from Last 3 Encounters:  06/23/20 204 lb 3.2 oz (92.6 kg)  05/08/20 201 lb (91.2 kg)  05/07/20 204 lb (92.5 kg)   Ht Readings from Last 3 Encounters:  06/23/20 5' 4.5" (1.638 m)  05/08/20 5' 4.5" (1.638 m)  05/07/20 5' 4.5" (1.638 m)   There is no height or weight on file to calculate BMI. @BMIFA @ Facility age limit for growth percentiles is 20  years. Facility age limit for growth percentiles is 20 years.  Notable Signs/Symptoms: Fatigue, increased thirst, frequent urination, blurry vision  Lifestyle & Dietary Hx She works 2 jobs and life is very stressful. Not eating meals on time and not taking insulin as prescribed. Has slipped into old habits.  Estimated daily fluid intake: 100 oz Supplements: none Sleep: 4-7 hrs  Stress / self-care: jobs are stressing Current average weekly physical activity: 2 hours  24-Hr Dietary Recall B) Banana   L) nothing D) chicken, salsa and corn,  Water,  [employer]    Estimated Energy Needs Calories: 1200-1400 Carbohydrate: 135g Protein: 90g Fat: 33g   NUTRITION DIAGNOSIS  NB-1.1 Food and nutrition-related knowledge deficit As related to Diabetes Type 2.  As evidenced by A1C 10.4%.  NUTRITION INTERVENTION  Nutrition  education (E-1) on the following topics:  Nutrition and Diabetes education provided on My Plate, CHO counting, meal planning, portion sizes, timing of meals, avoiding snacks between meals unless having a low blood sugar, target ranges for A1C and blood sugars, signs/symptoms and treatment of hyper/hypoglycemia, monitoring blood sugars, taking medications as prescribed, benefits of exercising 30 minutes per day and prevention of complications of DM.  Handouts Provided Include    Learning Style & Readiness for Change Teaching method utilized: Visual & Auditory  Demonstrated degree of understanding via: Teach Back  Barriers to learning/adherence to lifestyle change: None  Goals Established by Pt  Try Protein shake or Glucerna for breakfast. Try using Crockpot meals for lunch weekly Take medications as prescribed and don't miss doses or your blood sugars won't improve. Cut out all alcohol. Eat 5 oz of protein with lunch and dinner and 2 oz at breakfast. Increase lower carb vegetables. Get FBS less than 150 and less than 180 before bedtime. Take Trulicity weekly.    MONITORING & EVALUATION Dietary intake, weekly physical activity, and blood sugars  in 1 month. Recommend meal time insulin if BS are not improved by next visit. Next Steps  Patient is to start meal planning.Marland Kitchen

## 2020-08-24 ENCOUNTER — Other Ambulatory Visit: Payer: Self-pay

## 2020-08-24 ENCOUNTER — Encounter: Payer: 59 | Attending: Family Medicine | Admitting: Nutrition

## 2020-08-24 ENCOUNTER — Encounter: Payer: Self-pay | Admitting: Nutrition

## 2020-08-24 VITALS — Ht 63.5 in | Wt 210.0 lb

## 2020-08-24 DIAGNOSIS — E119 Type 2 diabetes mellitus without complications: Secondary | ICD-10-CM | POA: Diagnosis present

## 2020-08-24 DIAGNOSIS — I10 Essential (primary) hypertension: Secondary | ICD-10-CM | POA: Insufficient documentation

## 2020-08-24 DIAGNOSIS — E785 Hyperlipidemia, unspecified: Secondary | ICD-10-CM | POA: Diagnosis present

## 2020-08-24 DIAGNOSIS — E282 Polycystic ovarian syndrome: Secondary | ICD-10-CM | POA: Diagnosis present

## 2020-08-24 DIAGNOSIS — E1169 Type 2 diabetes mellitus with other specified complication: Secondary | ICD-10-CM | POA: Insufficient documentation

## 2020-08-24 NOTE — Progress Notes (Signed)
Medical Nutrition Therapy  Follow up Appointment Start time:   1515  Appointment End time: 1545 Dx 5 yrs  Ago. Sees  [name],FNP At [facility] Endocrin[facility]  Has gotten a  medication box and that his helping to remind her to take her medications. She is using the eatwise app also.  Changes made:  Still has a lot of stress in her life.   Levermir taking 65 units daily.Trulicity weekly. Sometimes forgets her Metformin BID and Glipizide Hasn't been testing much. Has been eating more Healthy Choice Meals. DOesn't tolerate dairy products.  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   Has 1 kid who is 3 yrs old. Gma takes care of her while she works when she isn't in school.   Referral diagnosis: e11.8, e78.0 Preferred learning style:  no preference indicated Learning readiness: change in progress   NUTRITION ASSESSMENT  Blood sugars are improving slowly but still high.  Diet remains high calories, fat and inconsistent carbs. Working 2 jobs and she finds it hard to get in dinner and eating meals on time. She is making some improvement at times. Not much exercise outside of her jobs due to her busy schedule.   Anthropometrics  Wt Readings from Last 3 Encounters:  06/23/20 204 lb 3.2 oz (92.6 kg)  05/08/20 201 lb (91.2 kg)  05/07/20 204 lb (92.5 kg)   Ht Readings from Last 3 Encounters:  06/23/20 5' 4.5" (1.638 m)  05/08/20 5' 4.5" (1.638 m)  05/07/20 5' 4.5" (1.638 m)   There is no height or weight on file to calculate BMI. @BMIFA @ Facility age limit for growth percentiles is 20 years. Facility age limit for growth percentiles is 20 years. Clinical Medical Hx: HTN and Hyperlipidemai Medications: Levermir to 65 units daily, Trulicity weekly, metformin 500 BID and Glipizide 10 mg Labs: FBS: 160-300  Bedtime: 240's.  Lab Results  Component Value Date   HGBA1C 11.9 (A)  06/23/2020   CMP Latest Ref Rng & Units 01/29/2020 10/24/2019 07/10/2019  Glucose 65 - 99 mg/dL 09/09/2019) 009(F) 818(E)  BUN 6 - 20 mg/dL 8 14 13   Creatinine 0.57 - 1.00 mg/dL 993(Z 1.69  Sodium 134 - 144 mmol/L 139 139 139  Potassium 3.5 - 5.2 mmol/L 3.8 4.4 4.3  Chloride 96 - 106 mmol/L 99 101 111  CO2 20 - 29 mmol/L 22 21 15(L)  Calcium 8.7 - 10.2 mg/dL 9.4 9.6 6.78)  Total Protein 6.0 - 8.5 g/dL 7.0 6.6 6.9  Total Bilirubin 0.0 - 1.2 mg/dL 0.2 9.38 1.0  Alkaline Phos 44 - 121 IU/L 77 68 68  AST 0 - 40 IU/L 25 17 38  ALT 0 - 32 IU/L 49(H) 22 34   Lipid Panel     Component Value Date/Time   CHOL 218 (H) 01/29/2020 0926   TRIG 353 (H) 01/29/2020 0926   HDL 33 (L) 01/29/2020 0926   CHOLHDL 6.6 (H) 01/29/2020 0926   CHOLHDL 5.1 07/10/2019 0506   VLDL 32 07/10/2019 0506   LDLCALC 123 (H) 01/29/2020 0926   LABVLDL 62 (H) 01/29/2020 0926   Wt Readings from Last 3 Encounters:  06/23/20 204 lb 3.2 oz (92.6 kg)  05/08/20 201 lb (91.2 kg)  05/07/20 204 lb (92.5 kg)   Ht Readings from Last 3 Encounters:  06/23/20 5' 4.5" (1.638 m)  05/08/20 5' 4.5" (1.638 m)  05/07/20 5' 4.5" (  1.638 m)   There is no height or weight on file to calculate BMI. @BMIFA @ Facility age limit for growth percentiles is 20 years. Facility age limit for growth percentiles is 20 years.  Notable Signs/Symptoms: Fatigue, increased thirst, frequent urination, blurry vision  Lifestyle & Dietary Hx She works 2 jobs and life is very stressful. Estimated daily fluid intake: 100 oz Supplements: none Sleep: 4-7 hrs  Stress / self-care: jobs are stressing Current average weekly physical activity: 2 hours  24-Hr Dietary Recall B) Spaghetti 1 cup, water L) Chicken alfredo sauce with broccoli, Water D) Chicken, zucchini, squash, onions, green beans and corn, Sweet tea   Estimated Energy Needs Calories: 1200-1400 Carbohydrate: 135g Protein: 90g Fat: 33g   NUTRITION DIAGNOSIS  NB-1.1 Food and  nutrition-related knowledge deficit As related to Diabetes Type 2.  As evidenced by A1C 10.4%.  NUTRITION INTERVENTION  Nutrition education (E-1) on the following topics:  Nutrition and Diabetes education provided on My Plate, CHO counting, meal planning, portion sizes, timing of meals, avoiding snacks between meals unless having a low blood sugar, target ranges for A1C and blood sugars, signs/symptoms and treatment of hyper/hypoglycemia, monitoring blood sugars, taking medications as prescribed, benefits of exercising 30 minutes per day and prevention of complications of DM.  Reviewed emotional eating, need to focus on more nutrient dense food choices and not skipping meals to lose weight.  Handouts Provided Include  Emotional eating    Learning Style & Readiness for Change Teaching method utilized: Visual & Auditory  Demonstrated degree of understanding via: Teach Back  Barriers to learning/adherence to lifestyle change: None  Goals Established by Pt Goals  Cut out sweet tea Look into the CGM with insurance Use pill boxes Drink 100 oz of water per day Test blood sugars in am before work daily.  Get A1C Down to 8%   MONITORING & EVALUATION Dietary intake, weekly physical activity, and blood sugars  in 1 month. Recommend meal time insulin if BS are not improved by next visit. Next Steps  Patient is to start meal planning. 

## 2020-08-24 NOTE — Patient Instructions (Signed)
Goals  Cut out sweet tea Look into the CGM with insurance Use pill boxes Drink 100 oz of water per day Test blood sugars in am before work daily.  Get A1C Down to 8%

## 2020-08-30 ENCOUNTER — Ambulatory Visit
Admission: EM | Admit: 2020-08-30 | Discharge: 2020-08-30 | Disposition: A | Discharge status: Home / Self Care | Payer: 59 | Attending: Emergency Medicine | Admitting: Emergency Medicine

## 2020-08-30 ENCOUNTER — Other Ambulatory Visit: Payer: Self-pay | Admitting: Nurse Practitioner

## 2020-08-30 ENCOUNTER — Other Ambulatory Visit: Payer: Self-pay

## 2020-08-30 DIAGNOSIS — E119 Type 2 diabetes mellitus without complications: Secondary | ICD-10-CM

## 2020-08-30 DIAGNOSIS — L02214 Cutaneous abscess of groin: Secondary | ICD-10-CM | POA: Diagnosis not present

## 2020-08-30 MED ORDER — DOXYCYCLINE HYCLATE 100 MG PO CAPS: 100.0000 mg | ORAL_CAPSULE | Freq: Two times a day (BID) | ORAL | 0 refills | Status: AC

## 2020-08-30 MED ORDER — HIBICLENS 4 % EX LIQD: Freq: Every day | CUTANEOUS | 0 refills | Status: DC | PRN

## 2020-08-30 NOTE — ED Provider Notes (Signed)
HPI  SUBJECTIVE:  Melanie Mccoy is a 30 y.o. female who presents with 5 days of a painful, erythematous mass of increasing size in her right groin after shaving her pubic region.  She reports clear, watery, whitish drainage starting last night with improvement in the pain.  No body aches, headaches, fevers.  No antibiotics in the past month.  No antipyretic in the past 6 hours.  She tried hot shower without improvement in her symptoms.  Symptoms better now that it is draining.  Symptoms are worse with squeezing on it.  She has a past medical history of abscesses, diabetes, hypertension.  No history of MRSA, hidradenitis suppurativa.  LMP: 2 weeks ago.  Denies possibility of being pregnant.  PMD:  family medicine.   Past Medical History:  Diagnosis Date   Anemia    slightly anemic, iron supplements every 2-3 days   Bartholin cyst    Chlamydia 01/2011   Diabetes mellitus without complication (HCC)    Family history of anesthesia complication    grandmother had spinal with nerve damage   No pertinent past medical history     Past Surgical History:  Procedure Laterality Date   CESAREAN SECTION N/A 11/22/2012   Procedure: PRIMARY CESAREAN SECTION;  Surgeon: Esmeralda Arthur, MD;  Location: [facility];  Service: Obstetrics;  Laterality: N/A;   CYST REMOVAL TRUNK  2005    Family History  Problem Relation Age of Onset   Cancer Other    Heart disease Other    Diabetes Other    Diabetes Mother    Diabetes Father    Diabetes Maternal Grandfather     Social History   Tobacco Use   Smoking status: Never   Smokeless tobacco: Never  Vaping Use   Vaping Use: Never used  Substance Use Topics   Alcohol use: No    Alcohol/week: 0.0 standard drinks   Drug use: No    No current facility-administered medications for this encounter.  Current Outpatient Medications:    chlorhexidine (HIBICLENS) 4 % external liquid, Apply topically daily as needed. Dilute 10-15 mL in water, Use  daily when bathing for 1-2 weeks, Disp: 120 mL, Rfl: 0   doxycycline (VIBRAMYCIN) 100 MG capsule, Take 1 capsule (100 mg total) by mouth 2 (two) times daily for 5 days., Disp: 10 capsule, Rfl: 0   norgestimate-ethinyl estradiol (MILI) 0.25-35 MG-MCG tablet, Take 1 tablet by mouth daily., Disp: 28 tablet, Rfl: 11   amLODipine (NORVASC) 5 MG tablet, Take 1 tablet (5 mg total) by mouth in the morning., Disp: 90 tablet, Rfl: 1   atorvastatin (LIPITOR) 10 MG tablet, Take 1 tablet (10 mg total) by mouth daily., Disp: 90 tablet, Rfl: 1   Dulaglutide (TRULICITY) 1.5 MG/0.5ML SOPN, Inject 1.5 mg into the skin once a week., Disp: 3 mL, Rfl: 1   glipiZIDE (GLUCOTROL XL) 10 MG 24 hr tablet, TAKE 1 TABLET BY MOUTH EVERY MORNING, Disp: 90 tablet, Rfl: 0   glucose blood test strip, Use as instructed, Disp: 50 each, Rfl: 12   insulin detemir (LEVEMIR FLEXTOUCH) 100 UNIT/ML FlexPen, Inject 65 Units into the skin at bedtime., Disp: 60 mL, Rfl: 0   Insulin Pen Needle (BD PEN NEEDLE NANO 2ND GEN) 32G X 4 MM MISC, Use 2x per day as directed., Disp: 100 each, Rfl: 2   ketoconazole (NIZORAL) 2 % shampoo, Apply to skin topically daily. Lather and leave on skin for 5 mins for 2 wks on rash, then use 2x  per week for 4 more weeks., Disp: 120 mL, Rfl: 1   Lancets (ACCU-CHEK SOFT TOUCH) lancets, Use as instructed, Disp: 50 each, Rfl: 12   lisinopril (ZESTRIL) 10 MG tablet, Take 1 tablet (10 mg total) by mouth daily., Disp: 90 tablet, Rfl: 1   metFORMIN (GLUCOPHAGE) 500 MG tablet, Take 1 tablet (500 mg total) by mouth 2 (two) times daily with a meal., Disp: 60 tablet, Rfl: 5  Allergies  Allergen Reactions   Bee Venom Swelling    Progressively worse with each sting   Ibuprofen Hives   Nickel Rash     ROS  As noted in HPI.   Physical Exam  BP (!) 156/112 (BP Location: Right Arm)   Pulse 97   Temp 98.5 F (36.9 C) (Oral)   Resp 16   LMP 08/16/2020 (Approximate)   SpO2 97%   Constitutional: Well developed, well  nourished, no acute distress Eyes:  EOMI, conjunctiva normal bilaterally HENT: Normocephalic, atraumatic,mucus membranes moist Respiratory: Normal inspiratory effort Cardiovascular: Normal rate GI: nondistended skin: 6x4 cm tender area of induration, erythema with central fluctuance right groin no expressible purulent drainage.  Marked area of erythema edema, induration with a marker.     Musculoskeletal: no deformities Neurologic: Alert & oriented x 3, no focal neuro deficits Psychiatric: Speech and behavior appropriate   ED Course   Medications - No data to display  Orders Placed This Encounter  Procedures   Aerobic Culture w Gram Stain (superficial specimen)    Standing Status:   Standing    Number of Occurrences:   1    No results found for this or any previous visit (from the past 24 hour(s)). No results found.  ED Clinical Impression  1. Abscess of right groin      ED Assessment/Plan  Patient with an abscess in the right groin.  Will I&D.  Procedure note: Cleaned area with alcohol and chlorhexidine.  Used 1.5 cc of 2% lidocaine with epinephrine via local infiltration with adequate anesthesia.  Cleaned with chlorhexidine 2 more times.  Then using an 11 blade, made a single stab incision and expressed a copious amount of purulent bloody material.  Culture sent.  Probed to break up loculations.  Irrigated out with 60 cc of saline.  No packing placed.  Pressure dressing placed.  Patient tolerated procedure well.  Home with doxycycline, Hibiclens, Tylenol.  She is allergic to ibuprofen.Return here for any concerns, to the ER if she gets worse.    Discussed MDM, treatment plan, and plan for follow-up with patient. Discussed sn/sx that should prompt return to the ED. patient agrees with plan.   Meds ordered this encounter  Medications   chlorhexidine (HIBICLENS) 4 % external liquid    Sig: Apply topically daily as needed. Dilute 10-15 mL in water, Use daily when  bathing for 1-2 weeks    Dispense:  120 mL    Refill:  0   doxycycline (VIBRAMYCIN) 100 MG capsule    Sig: Take 1 capsule (100 mg total) by mouth 2 (two) times daily for 5 days.    Dispense:  10 capsule    Refill:  0      *This clinic note was created using Scientist, clinical (histocompatibility and immunogenetics). Therefore, there may be occasional mistakes despite careful proofreading.  ?    Domenick Gong, MD 08/31/20 [phone]

## 2020-08-30 NOTE — Discharge Instructions (Addendum)
Keep the dressing on for 24 hours.  May take 1000 mg of Tylenol 3-4 times a day as needed for pain.  Warm compresses.  Finish the doxycycline, even if you feel better.

## 2020-08-30 NOTE — ED Triage Notes (Signed)
Patient presents to Urgent Care with complaints of abscess located on right groin site noted Weds. She states it has increased in size and has noted purulent drainage.   Denies fever.

## 2020-08-31 ENCOUNTER — Other Ambulatory Visit: Payer: Self-pay | Admitting: Family Medicine

## 2020-08-31 DIAGNOSIS — E119 Type 2 diabetes mellitus without complications: Secondary | ICD-10-CM

## 2020-09-02 LAB — AEROBIC CULTURE W GRAM STAIN (SUPERFICIAL SPECIMEN): Culture: NO GROWTH

## 2020-09-17 ENCOUNTER — Encounter: Payer: Self-pay | Admitting: Nutrition

## 2020-09-26 LAB — COMPREHENSIVE METABOLIC PANEL
ALT: 26 IU/L (ref 0–32)
AST: 26 IU/L (ref 0–40)
Albumin/Globulin Ratio: 1.8 (ref 1.2–2.2)
Albumin: 4.3 g/dL (ref 3.9–5.0)
Alkaline Phosphatase: 73 IU/L (ref 44–121)
BUN/Creatinine Ratio: 18 (ref 9–23)
BUN: 14 mg/dL (ref 6–20)
Bilirubin Total: 0.4 mg/dL (ref 0.0–1.2)
CO2: 21 mmol/L (ref 20–29)
Calcium: 9.5 mg/dL (ref 8.7–10.2)
Chloride: 97 mmol/L (ref 96–106)
Creatinine, Ser: 0.79 mg/dL (ref 0.57–1.00)
Globulin, Total: 2.4 g/dL (ref 1.5–4.5)
Glucose: 202 mg/dL — ABNORMAL HIGH (ref 65–99)
Potassium: 4.5 mmol/L (ref 3.5–5.2)
Sodium: 135 mmol/L (ref 134–144)
Total Protein: 6.7 g/dL (ref 6.0–8.5)
eGFR: 104 mL/min/{1.73_m2} (ref >59)

## 2020-09-28 ENCOUNTER — Other Ambulatory Visit: Payer: Self-pay

## 2020-09-28 ENCOUNTER — Encounter: Payer: 59 | Attending: "Endocrinology | Admitting: Nutrition

## 2020-09-28 ENCOUNTER — Encounter: Payer: Self-pay | Admitting: Nurse Practitioner

## 2020-09-28 ENCOUNTER — Ambulatory Visit: Payer: 59 | Admitting: Nurse Practitioner

## 2020-09-28 ENCOUNTER — Encounter: Payer: Self-pay | Admitting: Nutrition

## 2020-09-28 VITALS — BP 118/81 | HR 76 | Ht 64.5 in | Wt 206.4 lb

## 2020-09-28 DIAGNOSIS — E282 Polycystic ovarian syndrome: Secondary | ICD-10-CM | POA: Insufficient documentation

## 2020-09-28 DIAGNOSIS — E785 Hyperlipidemia, unspecified: Secondary | ICD-10-CM | POA: Diagnosis present

## 2020-09-28 DIAGNOSIS — I10 Essential (primary) hypertension: Secondary | ICD-10-CM | POA: Insufficient documentation

## 2020-09-28 DIAGNOSIS — E119 Type 2 diabetes mellitus without complications: Secondary | ICD-10-CM

## 2020-09-28 DIAGNOSIS — E1169 Type 2 diabetes mellitus with other specified complication: Secondary | ICD-10-CM | POA: Insufficient documentation

## 2020-09-28 LAB — POCT GLYCOSYLATED HEMOGLOBIN (HGB A1C): HbA1c, POC (controlled diabetic range): 11.3 % — AB (ref 0.0–7.0)

## 2020-09-28 MED ORDER — AMLODIPINE BESYLATE 5 MG PO TABS: 5.0000 mg | ORAL_TABLET | Freq: Every morning | ORAL | 1 refills | Status: DC

## 2020-09-28 MED ORDER — ATORVASTATIN CALCIUM 10 MG PO TABS: 10.0000 mg | ORAL_TABLET | Freq: Every day | ORAL | 1 refills | Status: DC

## 2020-09-28 MED ORDER — LEVEMIR FLEXTOUCH 100 UNIT/ML ~~LOC~~ SOPN: 75.0000 [IU] | PEN_INJECTOR | Freq: Every day | SUBCUTANEOUS | 3 refills | Status: DC

## 2020-09-28 MED ORDER — LISINOPRIL 10 MG PO TABS: 10.0000 mg | ORAL_TABLET | Freq: Every day | ORAL | 1 refills | Status: DC

## 2020-09-28 NOTE — Patient Instructions (Signed)

## 2020-09-28 NOTE — Progress Notes (Signed)
Medical Nutrition Therapy  Follow up Appointment Start time:   0830  Appointment End time: 0900 Dx 5 yrs  Ago. 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   Has 1 kid who is 29 yrs old. Gma takes care of her while she works when she isn't in school.   Referral diagnosis: e11.8, e78.0 Preferred learning style:  no preference indicated Learning readiness: change in progress   NUTRITION ASSESSMENT   Changes made: Not been able to make any changes. In the process of moving and working 2 jobs. Admits to not being able to exercise and her eating schedule is still a struggle.   Still has a lot of stress in her life.   Levermir taking  increased to 75 today  and Trulicity weekly. Sometimes forgets her Metformin BID and Glipizide A1C still high at 11.3%. Admits to fatigue, blurry vision and increased thirst and hunger. Her financial situation is a barrier for improved control of her DM with medications and cost of foods.  Anthropometrics  Wt Readings from Last 3 Encounters:  08/24/20 210 lb (95.3 kg)  06/23/20 204 lb 3.2 oz (92.6 kg)  05/08/20 201 lb (91.2 kg)   Ht Readings from Last 3 Encounters:  08/24/20 5' 3.5" (1.613 m)  06/23/20 5' 4.5" (1.638 m)  05/08/20 5' 4.5" (1.638 m)   There is no height or weight on file to calculate BMI. @BMIFA @ Facility age limit for growth percentiles is 20 years. Facility age limit for growth percentiles is 20 years. Clinical Medical Hx: HTN and Hyperlipidemai Medications: Levermir to 65 units daily, Trulicity weekly, metformin 500 BID and Glipizide 10 mg Labs: FBS: 160-300  Bedtime: 240's.  Lab Results  Component Value Date   HGBA1C 11.9 (A) 06/23/2020   CMP Latest Ref Rng & Units 09/25/2020 01/29/2020 10/24/2019  Glucose 65 - 99 mg/dL 10/26/2019) 626(R) 485(I)  BUN 6 - 20 mg/dL 14 8 14   Creatinine 0.57 - 1.00 mg/dL 627(O 3.50  Sodium 134 - 144 mmol/L 135 139 139  Potassium 3.5 - 5.2  mmol/L 4.5 3.8 4.4  Chloride 96 - 106 mmol/L 97 99 101  CO2 20 - 29 mmol/L 21 22 21   Calcium 8.7 - 10.2 mg/dL 9.5 9.4 9.6  Total Protein 6.0 - 8.5 g/dL 6.7 7.0 6.6  Total Bilirubin 0.0 - 1.2 mg/dL 0.4 0.2 0.93  Alkaline Phos 44 - 121 IU/L 73 77 68  AST 0 - 40 IU/L 26 25 17   ALT 0 - 32 IU/L 26 49(H) 22   Lipid Panel     Component Value Date/Time   CHOL 218 (H) 01/29/2020 0926   TRIG 353 (H) 01/29/2020 0926   HDL 33 (L) 01/29/2020 0926   CHOLHDL 6.6 (H) 01/29/2020 0926   CHOLHDL 5.1 07/10/2019 0506   VLDL 32 07/10/2019 0506   LDLCALC 123 (H) 01/29/2020 0926   LABVLDL 62 (H) 01/29/2020 0926   Wt Readings from Last 3 Encounters:  08/24/20 210 lb (95.3 kg)  06/23/20 204 lb 3.2 oz (92.6 kg)  05/08/20 201 lb (91.2 kg)   Ht Readings from Last 3 Encounters:  08/24/20 5' 3.5" (1.613 m)  06/23/20 5' 4.5" (1.638 m)  05/08/20 5' 4.5" (1.638 m)   There is no height or weight on file to calculate BMI. @BMIFA @ Facility age limit for growth percentiles is 20 years. Facility age limit for growth percentiles is 20 years.  Notable Signs/Symptoms: Fatigue,  increased thirst, frequent urination, blurry vision  Lifestyle & Dietary Hx She works 2 jobs and life is very stressful. Estimated daily fluid intake: 100 oz Supplements: none Sleep: 4-7 hrs  Stress / self-care: jobs are stressing Current average weekly physical activity: 2 hours    Estimated Energy Needs Calories: 1200-1400 Carbohydrate: 135g Protein: 90g Fat: 33g   NUTRITION DIAGNOSIS  NB-1.1 Food and nutrition-related knowledge deficit As related to Diabetes Type 2.  As evidenced by A1C 10.4%.  NUTRITION INTERVENTION  Meal planning, prevention of complications of DM. How insulin works and benefits of short acting insulin.   Reviewed emotional eating, need to focus on more nutrient dense food choices and not skipping meals to lose weight.  Handouts Provided Include  Emotional eating  Learning Style & Readiness for  Change Teaching method utilized: Visual & Auditory  Demonstrated degree of understanding via: Teach Back  Barriers to learning/adherence to lifestyle change: None  Goals Established by Pt  Test blood sugars 4 times per day for 7 days Drink only water Eat three meals per day.  Get A1C Down to 8%   MONITORING & EVALUATION Dietary intake, weekly physical activity, and blood sugars  in 1 month. Recommend meal time insulin if BS are not improved by next visit. Next Steps  Patient is to start meal planning.. Will follow up via mychart in 7 days to review meal time blood sugars.

## 2020-09-28 NOTE — Progress Notes (Signed)
Endocrinology Follow Up Visit       09/28/2020, 8:34 AM   Subjective:    Patient ID: Melanie Mccoy, female    DOB: March 18, 1990.  Melanie Mccoy is being seen in follow up after being seen in consultation for management of currently uncontrolled symptomatic diabetes requested by  Annalee Genta, DO.   Past Medical History:  Diagnosis Date   Anemia    slightly anemic, iron supplements every 2-3 days   Bartholin cyst    Chlamydia 01/2011   Diabetes mellitus without complication (HCC)    Family history of anesthesia complication    grandmother had spinal with nerve damage   No pertinent past medical history     Past Surgical History:  Procedure Laterality Date   CESAREAN SECTION N/A 11/22/2012   Procedure: PRIMARY CESAREAN SECTION;  Surgeon: Esmeralda Arthur, MD;  Location: [facility];  Service: Obstetrics;  Laterality: N/A;   CYST REMOVAL TRUNK  2005    Social History   Socioeconomic History   Marital status: Single    Spouse name: Not on file   Number of children: Not on file   Years of education: Not on file   Highest education level: Not on file  Occupational History   Not on file  Tobacco Use   Smoking status: Never   Smokeless tobacco: Never  Vaping Use   Vaping Use: Never used  Substance and Sexual Activity   Alcohol use: No    Alcohol/week: 0.0 standard drinks   Drug use: No   Sexual activity: Yes  Other Topics Concern   Not on file  Social History Narrative   Not on file   Social Determinants of Health   Financial Resource Strain: Not on file  Food Insecurity: Not on file  Transportation Needs: Not on file  Physical Activity: Not on file  Stress: Not on file  Social Connections: Not on file    Family History  Problem Relation Age of Onset   Cancer Other    Heart disease Other    Diabetes Other    Diabetes Mother    Diabetes Father    Diabetes Maternal Grandfather      Outpatient Encounter Medications as of 09/28/2020  Medication Sig   norgestimate-ethinyl estradiol (MILI) 0.25-35 MG-MCG tablet Take 1 tablet by mouth daily.   amLODipine (NORVASC) 5 MG tablet Take 1 tablet (5 mg total) by mouth in the morning.   atorvastatin (LIPITOR) 10 MG tablet Take 1 tablet (10 mg total) by mouth daily.   chlorhexidine (HIBICLENS) 4 % external liquid Apply topically daily as needed. Dilute 10-15 mL in water, Use daily when bathing for 1-2 weeks   glipiZIDE (GLUCOTROL XL) 10 MG 24 hr tablet TAKE 1 TABLET BY MOUTH EVERY MORNING   glucose blood test strip Use as instructed   insulin detemir (LEVEMIR FLEXTOUCH) 100 UNIT/ML FlexPen Inject 75 Units into the skin at bedtime.   Insulin Pen Needle (BD PEN NEEDLE NANO 2ND GEN) 32G X 4 MM MISC Use 2x per day as directed.   ketoconazole (NIZORAL) 2 % shampoo Apply to skin topically daily. Lather and leave on skin for 5 mins for 2 wks  on rash, then use 2x per week for 4 more weeks.   Lancets (ACCU-CHEK SOFT TOUCH) lancets Use as instructed   lisinopril (ZESTRIL) 10 MG tablet Take 1 tablet (10 mg total) by mouth daily.   metFORMIN (GLUCOPHAGE) 500 MG tablet TAKE 1 TABLET(500 MG) BY MOUTH TWICE DAILY WITH A MEAL   TRULICITY 1.5 MG/0.5ML SOPN ADMINISTER 1.5 MG UNDER THE SKIN 1 TIME A WEEK   [DISCONTINUED] amLODipine (NORVASC) 5 MG tablet Take 1 tablet (5 mg total) by mouth in the morning.   [DISCONTINUED] atorvastatin (LIPITOR) 10 MG tablet Take 1 tablet (10 mg total) by mouth daily.   [DISCONTINUED] insulin detemir (LEVEMIR FLEXTOUCH) 100 UNIT/ML FlexPen Inject 65 Units into the skin at bedtime.   [DISCONTINUED] lisinopril (ZESTRIL) 10 MG tablet Take 1 tablet (10 mg total) by mouth daily.   No facility-administered encounter medications on file as of 09/28/2020.    ALLERGIES: Allergies  Allergen Reactions   Bee Venom Swelling    Progressively worse with each sting   Ibuprofen Hives   Nickel Rash    VACCINATION  STATUS: Immunization History  Administered Date(s) Administered   Tdap 06/26/2006    Diabetes She presents for her follow-up diabetic visit. She has type 2 diabetes mellitus. Onset time: She was diagnosed at approx age of 67. Her disease course has been stable. There are no hypoglycemic associated symptoms. Associated symptoms include fatigue, polydipsia and polyuria. Pertinent negatives for diabetes include no blurred vision and no polyphagia. There are no hypoglycemic complications. Symptoms are stable. There are no diabetic complications. Risk factors for coronary artery disease include diabetes mellitus, dyslipidemia, hypertension, obesity, sedentary lifestyle, family history and stress. Current diabetic treatment includes insulin injections and oral agent (dual therapy). She is compliant with treatment most of the time. Her weight is fluctuating minimally. She is following a generally unhealthy diet. When asked about meal planning, she reported none. She has not had a previous visit with a dietitian. She participates in exercise intermittently. Her home blood glucose trend is fluctuating minimally. Her breakfast blood glucose range is generally >200 mg/dl. Her overall blood glucose range is >200 mg/dl. (She presents today with her meter, no logs, showing gross hyperglycemia overall.  Her POCT A1c today is 11.3%, improving slightly from last visit of 11.9%.  She reports significant amount of stress due to losing her job and moving houses, which has impacted her glycemic control.  Analysis of her meter shows 7-day average of 222, 14-day average of 245, 30-day average of 246, 90-day average of 249.  She denies any hypoglycemia.) An ACE inhibitor/angiotensin II receptor blocker is being taken. She does not see a podiatrist.Eye exam is current.  Hypertension This is a chronic problem. The current episode started more than 1 year ago. The problem has been gradually improving since onset. The problem is  controlled. Pertinent negatives include no blurred vision. There are no associated agents to hypertension. Risk factors for coronary artery disease include diabetes mellitus, dyslipidemia, obesity, sedentary lifestyle and stress. Past treatments include calcium channel blockers and ACE inhibitors. The current treatment provides mild improvement. Compliance problems include diet and exercise.   Hyperlipidemia This is a chronic problem. The current episode started more than 1 year ago. The problem is uncontrolled. Recent lipid tests were reviewed and are high. Exacerbating diseases include diabetes and obesity. Factors aggravating her hyperlipidemia include fatty foods. Current antihyperlipidemic treatment includes statins. The current treatment provides mild improvement of lipids. Compliance problems include adherence to diet and adherence to  exercise.  Risk factors for coronary artery disease include diabetes mellitus, dyslipidemia, hypertension, obesity, a sedentary lifestyle and stress.    Review of systems  Constitutional: + minimally fluctuating body weight,  current Body mass index is 34.88 kg/m. , + fatigue, no subjective hyperthermia, no subjective hypothermia Eyes: no blurry vision, no xerophthalmia ENT: no sore throat, no nodules palpated in throat, no dysphagia/odynophagia, no hoarseness Cardiovascular: no chest pain, no shortness of breath, no palpitations, no leg swelling Respiratory: no cough, no shortness of breath Gastrointestinal: no nausea/vomiting/diarrhea Musculoskeletal: no muscle/joint aches Skin: no rashes, no hyperemia Neurological: no tremors, no numbness, no tingling, no dizziness Psychiatric: no depression, no anxiety, reports increase in stress levels lately  Objective:     BP 118/81   Pulse 76   Ht 5' 4.5" (1.638 m)   Wt 206 lb 6.4 oz (93.6 kg)   BMI 34.88 kg/m   Wt Readings from Last 3 Encounters:  09/28/20 206 lb 6.4 oz (93.6 kg)  08/24/20 210 lb (95.3 kg)   06/23/20 204 lb 3.2 oz (92.6 kg)    BP Readings from Last 3 Encounters:  09/28/20 118/81  08/30/20 (!) 156/112  06/23/20 132/89      Physical Exam- Limited  Constitutional:  Body mass index is 34.88 kg/m. , not in acute distress, normal state of mind Eyes:  EOMI, no exophthalmos Neck: Supple Cardiovascular: RRR, no murmurs, rubs, or gallops, no edema Respiratory: Adequate breathing efforts, no crackles, rales, rhonchi, or wheezing Musculoskeletal: no gross deformities, strength intact in all four extremities, no gross restriction of joint movements Skin:  no rashes, no hyperemia Neurological: no tremor with outstretched hands    CMP ( most recent) CMP     Component Value Date/Time   NA 135 09/25/2020 1016   K 4.5 09/25/2020 1016   CL 97 09/25/2020 1016   CO2 21 09/25/2020 1016   GLUCOSE 202 (H) 09/25/2020 1016   GLUCOSE 318 (H) 07/10/2019 0506   BUN 14 09/25/2020 1016   CREATININE 0.79 09/25/2020 1016   CREATININE 0.77 01/14/2016 1514   CALCIUM 9.5 09/25/2020 1016   PROT 6.7 09/25/2020 1016   ALBUMIN 4.3 09/25/2020 1016   AST 26 09/25/2020 1016   ALT 26 09/25/2020 1016   ALKPHOS 73 09/25/2020 1016   BILITOT 0.4 09/25/2020 1016   GFRNONAA 118 01/29/2020 0926   GFRNONAA >89 01/14/2016 1514   GFRAA 136 01/29/2020 0926   GFRAA >89 01/14/2016 1514     Diabetic Labs (most recent): Lab Results  Component Value Date   HGBA1C 11.3 (A) 09/28/2020   HGBA1C 11.9 (A) 06/23/2020   HGBA1C 10.4 (H) 01/29/2020     Lipid Panel ( most recent) Lipid Panel     Component Value Date/Time   CHOL 218 (H) 01/29/2020 0926   TRIG 353 (H) 01/29/2020 0926   HDL 33 (L) 01/29/2020 0926   CHOLHDL 6.6 (H) 01/29/2020 0926   CHOLHDL 5.1 07/10/2019 0506   VLDL 32 07/10/2019 0506   LDLCALC 123 (H) 01/29/2020 0926   LABVLDL 62 (H) 01/29/2020 0926      Lab Results  Component Value Date   TSH 2.00 01/14/2016           Assessment & Plan:   1) Type 2 diabetes mellitus  without complication, without long-term current use of insulin (HCC)  - Melanie Mccoy has currently uncontrolled symptomatic type 2 DM since 30 years of age.   Recent labs reviewed.  She presents today with her meter,  no logs, showing gross hyperglycemia overall.  Her POCT A1c today is 11.3%, improving slightly from last visit of 11.9%.  She reports significant amount of stress due to losing her job and moving houses, which has impacted her glycemic control.  Analysis of her meter shows 7-day average of 222, 14-day average of 245, 30-day average of 246, 90-day average of 249.  She denies any hypoglycemia.  - I had a long discussion with her about the progressive nature of diabetes and the pathology behind its complications.  -She does not currently have any complications from diabetes but she remains at a high risk for more acute and chronic complications which include CAD, CVA, CKD, retinopathy, and neuropathy. These are all discussed in detail with her.  - Nutritional counseling repeated at each appointment due to patients tendency to fall back in to old habits.  - The patient admits there is a room for improvement in their diet and drink choices. -  Suggestion is made for the patient to avoid simple carbohydrates from their diet including Cakes, Sweet Desserts / Pastries, Ice Cream, Soda (diet and regular), Sweet Tea, Candies, Chips, Cookies, Sweet Pastries, Store Bought Juices, Alcohol in Excess of 1-2 drinks a day, Artificial Sweeteners, Coffee Creamer, and "Sugar-free" Products. This will help patient to have stable blood glucose profile and potentially avoid unintended weight gain.   - I encouraged the patient to switch to unprocessed or minimally processed complex starch and increased protein intake (animal or plant source), fruits, and vegetables.   - Patient is advised to stick to a routine mealtimes to eat 3 meals a day and avoid unnecessary snacks (to snack only to correct  hypoglycemia).  - she has been scheduled with Norm Salt, RDN, CDE for diabetes education (sees her after her appt with me today).  - I have approached her with the following individualized plan to manage  her diabetes and patient agrees:   -Given her persistent hyperglycemia and loss of control, she will tolerate increase in her Levemir to 75 units SQ daily at bedtime.  She is advised to continue Trulicity 1.5 mg SQ weekly, continue Metformin 500 mg po twice daily with meals, and continue Glipizide 10 mg XL daily with breakfast for now.  We discussed the potential need to add prandial insulin at next visit if we are still unable to get control of her diabetes with these medications alone.  She would like to avoid that if possible.  -she is encouraged to consistently monitor blood glucose twice daily, before breakfast and before bed, and to call the clinic if she has readings less than 70 or greater than 300 for 3 tests in a row.  - she is warned not to take insulin without proper monitoring per orders. - Adjustment parameters are given to her for hypo and hyperglycemia in writing.  - Specific targets for  A1c;  LDL, HDL,  and Triglycerides were discussed with the patient.  2) Blood Pressure /Hypertension:  Her blood pressure is controlled to target.  She is advised to continue Amlodipine 5 mg po daily, and Lisinopril 10 mg po daily.   3) Lipids/Hyperlipidemia:    Her most recent lipid panel from 01/29/20 shows uncontrolled LDL of 123 and elevated triglycerides of 353.  She is advised to continue Lipitor 10 mg po daily at bedtime.  Side effects and precautions discussed with her.  She was advised to avoid fried foods and butter.  Will recheck lipid panel prior to next visit.  4)  Weight/Diet:  her Body mass index is 34.88 kg/m.  - clearly complicating her diabetes care.   she is a candidate for weight loss. I discussed with her the fact that loss of 5 - 10% of her  current body weight will  have the most impact on her diabetes management.  Exercise, and detailed carbohydrates information provided  -  detailed on discharge instructions.  5) Chronic Care/Health Maintenance: -she is on ACEI/ARB and Statin medications and is encouraged to initiate and continue to follow up with Ophthalmology, Dentist,  Podiatrist at least yearly or according to recommendations, and advised to stay away from smoking. I have recommended yearly flu vaccine and pneumonia vaccine at least every 5 years; moderate intensity exercise for up to 150 minutes weekly; and  sleep for at least 7 hours a day.  - she is  advised to maintain close follow up with Annalee Gentaaylor, Malena M, DO for primary care needs, as well as her other providers for optimal and coordinated care.       I spent 30 minutes in the care of the patient today including review of labs from CMP, Lipids, Thyroid Function, Hematology (current and previous including abstractions from other facilities); face-to-face time discussing  her blood glucose readings/logs, discussing hypoglycemia and hyperglycemia episodes and symptoms, medications doses, her options of short and long term treatment based on the latest standards of care / guidelines;  discussion about incorporating lifestyle medicine;  and documenting the encounter.    Please refer to Patient Instructions for Blood Glucose Monitoring and Insulin/Medications Dosing Guide"  in media tab for additional information. Please  also refer to " Patient Self Inventory" in the Media  tab for reviewed elements of pertinent patient history.  Gladstone LighterKayla B Slade Sams participated in the discussions, expressed understanding, and voiced agreement with the above plans.  All questions were answered to her satisfaction. she is encouraged to contact clinic should she have any questions or concerns prior to her return visit.  Follow up plan: - Return in about 3 months (around 12/29/2020) for Diabetes F/U with A1c in office,  Previsit labs, Bring meter and logs.  Ronny BaconWhitney Saylee Sherrill, [facility] AssociatesFNP-BC [facility] [address] [place], [place] [address] Phone: [phone] Fax: [phone]  09/28/2020, 8:34 AM

## 2020-09-28 NOTE — Patient Instructions (Signed)
Goals Established by Pt  Test blood sugars 4 times per day for 7 days Drink only water Eat three meals per day.  Get A1C Down to 8%

## 2020-09-30 ENCOUNTER — Other Ambulatory Visit: Payer: Self-pay | Admitting: Family Medicine

## 2020-09-30 DIAGNOSIS — E119 Type 2 diabetes mellitus without complications: Secondary | ICD-10-CM

## 2020-10-01 ENCOUNTER — Other Ambulatory Visit: Payer: Self-pay

## 2020-10-01 DIAGNOSIS — E119 Type 2 diabetes mellitus without complications: Secondary | ICD-10-CM

## 2020-10-01 MED ORDER — BASAGLAR KWIKPEN 100 UNIT/ML ~~LOC~~ SOPN: 75.0000 [IU] | PEN_INJECTOR | Freq: Every day | SUBCUTANEOUS | 1 refills | Status: DC

## 2020-10-05 ENCOUNTER — Encounter: Payer: 59 | Attending: Family Medicine | Admitting: Nutrition

## 2020-10-05 DIAGNOSIS — E282 Polycystic ovarian syndrome: Secondary | ICD-10-CM | POA: Insufficient documentation

## 2020-10-05 DIAGNOSIS — I10 Essential (primary) hypertension: Secondary | ICD-10-CM | POA: Diagnosis present

## 2020-10-05 DIAGNOSIS — E119 Type 2 diabetes mellitus without complications: Secondary | ICD-10-CM | POA: Diagnosis present

## 2020-10-05 NOTE — Progress Notes (Addendum)
Medical Nutrition Therapy  Follow up 1300 -1330  Dx 5 yrs  Ago. Sees  [name],FNP At [facility] Was seen last week.  This visit was completed via telephone due to the COVID-19 pandemic.   I spoke with Melanie Mccoy and verified that I was speaking with the correct person with two patient identifiers (full name and date of birth).   I discussed the limitations related to this kind of visit and the patient is willing to proceed.   Has been testing 4 times per day. Has been working on eating meals on time. Eating a protein shake at 730, and then eats at 12noon. BS are slightly better, but still above target goals.  Last 7 day  09/29/20 BB 204,  snack BL 267, BS before snack 258 , BB 204 before bed 123 7/27  195, BL 241 BD 189 BB 235 7/28 bb 181,  185 before snack, 252 mg after lunch(1 hr)  Betime 243 7/29 BB 153, BL 217 Betime 265 7/30 BB 147 mg/dl  BL 299 -supper-  Before bed 200 mg/d; 7/31  BB 153 mg/dl BL 242   -supper-  BB 683 8/1 BB 203, BL 232 mg/dl.  Breakfast usually is protein shake with other protein and fruit, Lunch: avoiding simple carbs; corn, pasta, crackers Dinner: shakes for dinner while working Thursday pm, halapenos and bacon-meat and vegetables, chicken zucchini/squash, or tacos  Has been drinking a lot of water and is less thirsty now. Less cravings with drinking water.  AVG 7 days was 222 mg/dl.   8-5 works [employer] and 5 pm -2 am at [employer].   Has 1 kid who is 63 yrs old. Gma takes care of her while she works when she isn't in school.  Referral diagnosis: e11.8, e78.0 Preferred learning style:  no preference indicated Learning readiness: change in progress   NUTRITION ASSESSMENT   Changes made: Not been able to make any changes. In the process of moving and working 2 jobs. Admits to not being able to exercise and her eating schedule is still a struggle.   Still has a lot of stress in her life.   Levermir taking  increased to 75 today  and  Trulicity weekly. Sometimes forgets her Metformin BID and Glipizide A1C still high at 11.3%. Admits to fatigue, blurry vision and increased thirst and hunger. Her financial situation is a barrier for improved control of her DM with medications and cost of foods.  Anthropometrics  Wt Readings from Last 3 Encounters:  09/28/20 206 lb 6.4 oz (93.6 kg)  08/24/20 210 lb (95.3 kg)  06/23/20 204 lb 3.2 oz (92.6 kg)   Ht Readings from Last 3 Encounters:  09/28/20 5' 4.5" (1.638 m)  08/24/20 5' 3.5" (1.613 m)  06/23/20 5' 4.5" (1.638 m)   There is no height or weight on file to calculate BMI. @BMIFA @ Facility age limit for growth percentiles is 20 years. Facility age limit for growth percentiles is 20 years. Clinical Medical Hx: HTN and Hyperlipidemai Medications: Levermir to 65 units daily, Trulicity weekly, metformin 500 BID and Glipizide 10 mg Labs: FBS: 160-300  Bedtime: 240's.  Lab Results  Component Value Date   HGBA1C 11.3 (A) 09/28/2020   CMP Latest Ref Rng & Units 09/25/2020 01/29/2020 10/24/2019  Glucose 65 - 99 mg/dL 10/26/2019) 419(Q) 222(L)  BUN 6 - 20 mg/dL 14 8 14   Creatinine 0.57 - 1.00 mg/dL 798(X 2.11  Sodium 134 - 144 mmol/L 135 139 139  Potassium  3.5 - 5.2 mmol/L 4.5 3.8 4.4  Chloride 96 - 106 mmol/L 97 99 101  CO2 20 - 29 mmol/L 21 22 21   Calcium 8.7 - 10.2 mg/dL 9.5 9.4 9.6  Total Protein 6.0 - 8.5 g/dL 6.7 7.0 6.6  Total Bilirubin 0.0 - 1.2 mg/dL 0.4 0.2  Alkaline Phos 44 - 121 IU/L 73 77 68  AST 0 - 40 IU/L 26 25 17   ALT 0 - 32 IU/L 26 49(H) 22   Lipid Panel     Component Value Date/Time   CHOL 218 (H) 01/29/2020 0926   TRIG 353 (H) 01/29/2020 0926   HDL 33 (L) 01/29/2020 0926   CHOLHDL 6.6 (H) 01/29/2020 0926   CHOLHDL 5.1 07/10/2019 0506   VLDL 32 07/10/2019 0506   LDLCALC 123 (H) 01/29/2020 0926   LABVLDL 62 (H) 01/29/2020 0926   Wt Readings from Last 3 Encounters:  09/28/20 206 lb 6.4 oz (93.6 kg)  08/24/20 210 lb (95.3 kg)  06/23/20 204  lb 3.2 oz (92.6 kg)   Ht Readings from Last 3 Encounters:  09/28/20 5' 4.5" (1.638 m)  08/24/20 5' 3.5" (1.613 m)  06/23/20 5' 4.5" (1.638 m)   There is no height or weight on file to calculate BMI. @BMIFA @ Facility age limit for growth percentiles is 20 years. Facility age limit for growth percentiles is 20 years.  Notable Signs/Symptoms: Fatigue, increased thirst, frequent urination, blurry vision  Lifestyle & Dietary Hx She works 2 jobs and life is very stressful. Estimated daily fluid intake: 100 oz Supplements: none Sleep: 4-7 hrs  Stress / self-care: jobs are stressing Current average weekly physical activity: 2 hours    Estimated Energy Needs Calories: 1200-1400 Carbohydrate: 135g Protein: 90g Fat: 33g   NUTRITION DIAGNOSIS  NB-1.1 Food and nutrition-related knowledge deficit As related to Diabetes Type 2.  As evidenced by A1C 10.4%.  NUTRITION INTERVENTION  Meal planning, prevention of complications of DM. How insulin works and benefits of short acting insulin.   Reviewed emotional eating, need to focus on more nutrient dense food choices and not skipping meals to lose weight.  Handouts Provided Include  Emotional eating  Learning Style & Readiness for Change Teaching method utilized: Visual & Auditory  Demonstrated degree of understanding via: Teach Back  Barriers to learning/adherence to lifestyle change: None  Goals Established by Pt  Test blood sugars 4 times per day for 7 days Drink only water Eat three meals per day. Try some of the Healthy Choice Meal Replacements.  Get A1C Down to 8%   MONITORING & EVALUATION Dietary intake, weekly physical activity, and blood sugars  in 1 month. Recommend meal time insulin if BS are not improved by next visit. Next Steps  Patient is to start meal planning.. Will follow up via mychart in 7 days to review meal time blood sugars.

## 2020-10-12 ENCOUNTER — Other Ambulatory Visit: Payer: Self-pay | Admitting: Nurse Practitioner

## 2020-10-12 DIAGNOSIS — E119 Type 2 diabetes mellitus without complications: Secondary | ICD-10-CM

## 2020-10-12 NOTE — Patient Instructions (Signed)
  Goals Established by Pt   Test blood sugars 4 times per day for 7 days Drink only water Eat three meals per day. Try some of the  Healthy Choice meals for meal replacements.   Get A1C Down to 8%

## 2020-11-05 ENCOUNTER — Ambulatory Visit
Admission: EM | Admit: 2020-11-05 | Discharge: 2020-11-05 | Disposition: A | Discharge status: Home / Self Care | Payer: 59 | Attending: Emergency Medicine | Admitting: Emergency Medicine

## 2020-11-05 ENCOUNTER — Other Ambulatory Visit: Payer: Self-pay

## 2020-11-05 ENCOUNTER — Encounter: Payer: Self-pay | Admitting: *Deleted

## 2020-11-05 DIAGNOSIS — L02214 Cutaneous abscess of groin: Secondary | ICD-10-CM | POA: Diagnosis not present

## 2020-11-05 MED ORDER — DOXYCYCLINE HYCLATE 100 MG PO CAPS: 100.0000 mg | ORAL_CAPSULE | Freq: Two times a day (BID) | ORAL | 0 refills | Status: DC

## 2020-11-05 NOTE — ED Provider Notes (Signed)
[facility]   [id number] 11/05/20 Arrival Time: 0827   NO:[id number]  SUBJECTIVE:  Melanie Mccoy is a 30 y.o. female who presents with a possible abscess of her RT groin x few days. Symptoms began after shaving.  Has tried warm compresses  Reports previous symptoms in the past.  Complains of associated redness, swelling, and pustule.  Denies fever, chills, nausea, vomiting.    ROS: As per HPI.  All other pertinent ROS negative.     Past Medical History:  Diagnosis Date   Anemia    slightly anemic, iron supplements every 2-3 days   Bartholin cyst    Chlamydia 01/2011   Diabetes mellitus without complication (HCC)    Family history of anesthesia complication    grandmother had spinal with nerve damage   No pertinent past medical history    Past Surgical History:  Procedure Laterality Date   CESAREAN SECTION N/A 11/22/2012   Procedure: PRIMARY CESAREAN SECTION;  Surgeon: Esmeralda Arthur, MD;  Location: [facility];  Service: Obstetrics;  Laterality: N/A;   CYST REMOVAL TRUNK  2005   Allergies  Allergen Reactions   Bee Venom Swelling    Progressively worse with each sting   Ibuprofen Hives   Nickel Rash   No current facility-administered medications on file prior to encounter.   Current Outpatient Medications on File Prior to Encounter  Medication Sig Dispense Refill   norgestimate-ethinyl estradiol (MILI) 0.25-35 MG-MCG tablet Take 1 tablet by mouth daily. 28 tablet 11   amLODipine (NORVASC) 5 MG tablet Take 1 tablet (5 mg total) by mouth in the morning. 90 tablet 1   atorvastatin (LIPITOR) 10 MG tablet Take 1 tablet (10 mg total) by mouth daily. 90 tablet 1   chlorhexidine (HIBICLENS) 4 % external liquid Apply topically daily as needed. Dilute 10-15 mL in water, Use daily when bathing for 1-2 weeks 120 mL 0   glipiZIDE (GLUCOTROL XL) 10 MG 24 hr tablet TAKE 1 TABLET BY MOUTH EVERY MORNING 60 tablet 0   glucose blood test strip Use as instructed 50 each 12   Insulin  Pen Needle (BD PEN NEEDLE NANO 2ND GEN) 32G X 4 MM MISC Use 2x per day as directed. 100 each 2   ketoconazole (NIZORAL) 2 % shampoo Apply to skin topically daily. Lather and leave on skin for 5 mins for 2 wks on rash, then use 2x per week for 4 more weeks. 120 mL 1   Lancets (ACCU-CHEK SOFT TOUCH) lancets Use as instructed 50 each 12   LEVEMIR FLEXTOUCH 100 UNIT/ML FlexTouch Pen ADMINISTER 75 UNITS UNDER THE SKIN AT BEDTIME 75 mL 0   lisinopril (ZESTRIL) 10 MG tablet Take 1 tablet (10 mg total) by mouth daily. 90 tablet 1   metFORMIN (GLUCOPHAGE) 500 MG tablet TAKE 1 TABLET(500 MG) BY MOUTH TWICE DAILY WITH A MEAL 60 tablet 1   TRULICITY 1.5 MG/0.5ML SOPN ADMINISTER 1.5 MG UNDER THE SKIN 1 TIME A WEEK 3 mL 1   Social History   Socioeconomic History   Marital status: Single    Spouse name: Not on file   Number of children: Not on file   Years of education: Not on file   Highest education level: Not on file  Occupational History   Not on file  Tobacco Use   Smoking status: Never   Smokeless tobacco: Never  Vaping Use   Vaping Use: Never used  Substance and Sexual Activity   Alcohol use: No  Alcohol/week: 0.0 standard drinks   Drug use: No   Sexual activity: Yes  Other Topics Concern   Not on file  Social History Narrative   Not on file   Social Determinants of Health   Financial Resource Strain: Not on file  Food Insecurity: Not on file  Transportation Needs: Not on file  Physical Activity: Not on file  Stress: Not on file  Social Connections: Not on file  Intimate Partner Violence: Not on file   Family History  Problem Relation Age of Onset   Cancer Other    Heart disease Other    Diabetes Other    Diabetes Mother    Diabetes Father    Diabetes Maternal Grandfather     OBJECTIVE:  Vitals:   11/05/20 0837  BP: (!) 133/96  Pulse: 99  Resp: 18  Temp: 98.8 F (37.1 C)  SpO2: 99%     General appearance: alert; no distress Skin: 4-5 cm induration of her RT  groin; tender to touch; no active drainage Psychological: alert and cooperative; normal mood and affect  Procedure: Verbal consent obtained. Area over induration cleaned with betadine. Lidocaine 2% with epinephrine used to obtain local anesthesia. The most fluctuant portion of the abscess was incised with a #11 blade scalpel. Abscess cavity explored and evacuated. Loculations broken up with a curved hemostat as best as possible given patient discomfort. Cavity packed with packing material and dressed with a clean gauze dressing. Minimal bleeding. No complications.  ASSESSMENT & PLAN:  1. Abscess of right groin     Meds ordered this encounter  Medications   doxycycline (VIBRAMYCIN) 100 MG capsule    Sig: Take 1 capsule (100 mg total) by mouth 2 (two) times daily.    Dispense:  20 capsule    Refill:  0    Order Specific Question:   Supervising Provider    Answer:   Eustace Moore [[id number]]    Keep dry and covered for next 24-48 hours Remove packing in 48 hours either at home or return here After packing is removed in you may then begin appling warm compresses 3-4x daily for 10-15 minutes.  You may then wash site daily with warm water and mild soap Keep covered to avoid friction Take antibiotic as prescribed and to completion Return sooner or go to the ED if you have any new or worsening symptoms such as increased redness, swelling, pain, nausea, vomiting, fever, chills, etc...   Reviewed expectations re: course of current medical issues. Questions answered. Outlined signs and symptoms indicating need for more acute intervention. Patient verbalized understanding. After Visit Summary given.           Rennis Harding, PA-C 11/05/20 [phone]

## 2020-11-05 NOTE — Discharge Instructions (Addendum)
Keep dry and covered for next 24-48 hours Remove packing in 48 hours either at home or return here After packing is removed in you may then begin appling warm compresses 3-4x daily for 10-15 minutes.  You may then wash site daily with warm water and mild soap Keep covered to avoid friction Take antibiotic as prescribed and to completion Return sooner or go to the ED if you have any new or worsening symptoms such as increased redness, swelling, pain, nausea, vomiting, fever, chills, etc...  

## 2020-11-05 NOTE — ED Triage Notes (Signed)
Pt reports abscess in Rt groin after shaving area.

## 2020-11-10 ENCOUNTER — Encounter: Payer: 59 | Attending: Family Medicine | Admitting: Nutrition

## 2020-11-10 ENCOUNTER — Encounter: Payer: Self-pay | Admitting: Nutrition

## 2020-11-10 DIAGNOSIS — E785 Hyperlipidemia, unspecified: Secondary | ICD-10-CM | POA: Insufficient documentation

## 2020-11-10 DIAGNOSIS — I10 Essential (primary) hypertension: Secondary | ICD-10-CM | POA: Insufficient documentation

## 2020-11-10 DIAGNOSIS — E119 Type 2 diabetes mellitus without complications: Secondary | ICD-10-CM | POA: Insufficient documentation

## 2020-11-10 DIAGNOSIS — E1169 Type 2 diabetes mellitus with other specified complication: Secondary | ICD-10-CM | POA: Insufficient documentation

## 2020-11-10 DIAGNOSIS — E282 Polycystic ovarian syndrome: Secondary | ICD-10-CM | POA: Insufficient documentation

## 2020-11-23 ENCOUNTER — Other Ambulatory Visit: Payer: Self-pay | Admitting: Nurse Practitioner

## 2020-11-23 DIAGNOSIS — E119 Type 2 diabetes mellitus without complications: Secondary | ICD-10-CM

## 2020-12-21 ENCOUNTER — Ambulatory Visit
Admission: RE | Admit: 2020-12-21 | Discharge: 2020-12-21 | Disposition: A | Discharge status: Home / Self Care | Payer: 59 | Source: Ambulatory Visit | Attending: Student | Admitting: Student

## 2020-12-21 ENCOUNTER — Other Ambulatory Visit: Payer: Self-pay

## 2020-12-21 VITALS — BP 135/95 | HR 96 | Temp 98.4°F | Resp 20

## 2020-12-21 DIAGNOSIS — L02214 Cutaneous abscess of groin: Secondary | ICD-10-CM

## 2020-12-21 MED ORDER — DOXYCYCLINE HYCLATE 100 MG PO CAPS: 100.0000 mg | ORAL_CAPSULE | Freq: Two times a day (BID) | ORAL | 0 refills | Status: AC

## 2020-12-21 NOTE — ED Triage Notes (Signed)
Pt presents with abscess in right groin that began draining last night

## 2020-12-21 NOTE — Discharge Instructions (Addendum)
-  Doxycycline twice daily for 7 days.  Make sure to wear sunscreen while spending time outside while on this medication as it can increase your chance of sunburn. You can take this medication with food if you have a sensitive stomach. -Warm compresses, wash with gentle soap and water

## 2020-12-21 NOTE — ED Provider Notes (Signed)
[facility]    CSN: [id number] Arrival date & time: 12/21/20  1749      History   Chief Complaint Chief Complaint  Patient presents with   Abscess    HPI Melanie Mccoy is a 30 y.o. female presenting with abscess.  Medical history of Bartholin cyst, chlamydia, diabetes.  Describes few days of abscess right groin that started draining during her sleep last night.  States she squeezed it and a lot of pus came out, now with some bloody discharge.  Feeling well otherwise.  She has had multiple abscesses in this area and states that she actually has laser hair removal scheduled as her PCP thinks is related to ingrown hairs.  Denies fever/chills.  She is a diabetic.  HPI  Past Medical History:  Diagnosis Date   Anemia    slightly anemic, iron supplements every 2-3 days   Bartholin cyst    Chlamydia 01/2011   Diabetes mellitus without complication (HCC)    Family history of anesthesia complication    grandmother had spinal with nerve damage   No pertinent past medical history     Patient Active Problem List   Diagnosis Date Noted   Essential hypertension 09/18/2019   Acute respiratory failure with hypoxia (HCC) 07/09/2019   COVID-19 virus infection 07/09/2019   Hyperlipidemia associated with type 2 diabetes mellitus (HCC) 02/04/2017   Type 2 diabetes mellitus without complication, without long-term current use of insulin (HCC) 01/19/2016   Acne vulgaris 04/15/2014   Morbid obesity (HCC) 04/15/2014   PCOS (polycystic ovarian syndrome) 04/15/2014   Status post primary low transverse cesarean section 11/22/2012    Past Surgical History:  Procedure Laterality Date   CESAREAN SECTION N/A 11/22/2012   Procedure: PRIMARY CESAREAN SECTION;  Surgeon: Esmeralda Arthur, MD;  Location: [facility];  Service: Obstetrics;  Laterality: N/A;   CYST REMOVAL TRUNK  2005    OB History     Gravida  2   Para  2   Term      Preterm      AB      Living  1      SAB       IAB      Ectopic      Multiple      Live Births  1            Home Medications    Prior to Admission medications   Medication Sig Start Date End Date Taking? Authorizing Provider  doxycycline (VIBRAMYCIN) 100 MG capsule Take 1 capsule (100 mg total) by mouth 2 (two) times daily for 7 days. 12/21/20 12/28/20 Yes Rhys Martini, PA-C  norgestimate-ethinyl estradiol (MILI) 0.25-35 MG-MCG tablet Take 1 tablet by mouth daily. 07/09/20   Ladona Ridgel, Malena M, DO  amLODipine (NORVASC) 5 MG tablet Take 1 tablet (5 mg total) by mouth in the morning. 09/28/20   Dani Gobble, NP  atorvastatin (LIPITOR) 10 MG tablet Take 1 tablet (10 mg total) by mouth daily. 09/28/20   Dani Gobble, NP  chlorhexidine (HIBICLENS) 4 % external liquid Apply topically daily as needed. Dilute 10-15 mL in water, Use daily when bathing for 1-2 weeks 08/30/20   Domenick Gong, MD  glipiZIDE (GLUCOTROL XL) 10 MG 24 hr tablet TAKE 1 TABLET BY MOUTH EVERY MORNING 09/30/20   Laroy Apple M, DO  glucose blood test strip Use as instructed 01/14/19   Campbell Riches, NP  Insulin Pen Needle (BD PEN NEEDLE  NANO 2ND GEN) 32G X 4 MM MISC Use 2x per day as directed. 01/29/20   Laroy Apple M, DO  ketoconazole (NIZORAL) 2 % shampoo Apply to skin topically daily. Lather and leave on skin for 5 mins for 2 wks on rash, then use 2x per week for 4 more weeks. 05/01/20   Laroy Apple M, DO  Lancets (ACCU-CHEK SOFT TOUCH) lancets Use as instructed 01/14/19   Campbell Riches, NP  LEVEMIR FLEXTOUCH 100 UNIT/ML FlexTouch Pen ADMINISTER 75 UNITS UNDER THE SKIN AT BEDTIME 10/12/20   Dani Gobble, NP  lisinopril (ZESTRIL) 10 MG tablet Take 1 tablet (10 mg total) by mouth daily. 09/28/20   Dani Gobble, NP  metFORMIN (GLUCOPHAGE) 500 MG tablet TAKE 1 TABLET(500 MG) BY MOUTH TWICE DAILY WITH A MEAL 09/30/20   Ladona Ridgel, Malena M, DO  TRULICITY 1.5 MG/0.5ML SOPN ADMINISTER 1.5 MG UNDER THE SKIN 1 TIME A WEEK 11/24/20   Dani Gobble, NP    Family History Family History  Problem Relation Age of Onset   Cancer Other    Heart disease Other    Diabetes Other    Diabetes Mother    Diabetes Father    Diabetes Maternal Grandfather     Social History Social History   Tobacco Use   Smoking status: Never   Smokeless tobacco: Never  Vaping Use   Vaping Use: Never used  Substance Use Topics   Alcohol use: No    Alcohol/week: 0.0 standard drinks   Drug use: No     Allergies   Bee venom, Ibuprofen, and Nickel   Review of Systems Review of Systems  Skin:        Abscess   All other systems reviewed and are negative.   Physical Exam Triage Vital Signs ED Triage Vitals  Enc Vitals Group     BP      Pulse      Resp      Temp      Temp src      SpO2      Weight      Height      Head Circumference      Peak Flow      Pain Score      Pain Loc      Pain Edu?      Excl. in GC?    No data found.  Updated Vital Signs BP (!) 135/95   Pulse 96   Temp 98.4 F (36.9 C)   Resp 20   SpO2 97%   Visual Acuity Right Eye Distance:   Left Eye Distance:   Bilateral Distance:    Right Eye Near:   Left Eye Near:    Bilateral Near:     Physical Exam Vitals reviewed.  Constitutional:      General: She is not in acute distress.    Appearance: Normal appearance. She is not ill-appearing or diaphoretic.  HENT:     Head: Normocephalic and atraumatic.  Cardiovascular:     Rate and Rhythm: Normal rate and regular rhythm.     Heart sounds: Normal heart sounds.  Pulmonary:     Effort: Pulmonary effort is normal.     Breath sounds: Normal breath sounds.  Skin:    General: Skin is warm.     Findings: Abscess present.     Comments: R groin-well-circumscribed 1 x 3 cm area of induration.  Spontaneously draining. No fluctuance.  Neurological:  General: No focal deficit present.     Mental Status: She is alert and oriented to person, place, and time.  Psychiatric:        Mood and Affect:  Mood normal.        Behavior: Behavior normal.        Thought Content: Thought content normal.        Judgment: Judgment normal.     UC Treatments / Results  Labs (all labs ordered are listed, but only abnormal results are displayed) Labs Reviewed - No data to display  EKG   Radiology No results found.  Procedures Procedures (including critical care time)  Medications Ordered in UC Medications - No data to display  Initial Impression / Assessment and Plan / UC Course  I have reviewed the triage vital signs and the nursing notes.  Pertinent labs & imaging results that were available during my care of the patient were reviewed by me and considered in my medical decision making (see chart for details).     This patient is a very pleasant 30 y.o. year old female presenting with abscess- spontaneously draining. Afebrile, nontachy.  Doxycycline as below.   ED return precautions discussed. Patient verbalizes understanding and agreement.    Final Clinical Impressions(s) / UC Diagnoses   Final diagnoses:  Groin abscess     Discharge Instructions      -Doxycycline twice daily for 7 days.  Make sure to wear sunscreen while spending time outside while on this medication as it can increase your chance of sunburn. You can take this medication with food if you have a sensitive stomach. -Warm compresses, wash with gentle soap and water     ED Prescriptions     Medication Sig Dispense Auth. Provider   doxycycline (VIBRAMYCIN) 100 MG capsule Take 1 capsule (100 mg total) by mouth 2 (two) times daily for 7 days. 14 capsule Rhys Martini, PA-C      PDMP not reviewed this encounter.   Rhys Martini, PA-C 12/21/20 1912

## 2020-12-25 ENCOUNTER — Telehealth: Payer: 59 | Admitting: Physician Assistant

## 2020-12-25 DIAGNOSIS — J069 Acute upper respiratory infection, unspecified: Secondary | ICD-10-CM

## 2020-12-25 MED ORDER — PROMETHAZINE-DM 6.25-15 MG/5ML PO SYRP
5.0000 mL | ORAL_SOLUTION | Freq: Four times a day (QID) | ORAL | 0 refills | Status: DC | PRN
Start: 2020-12-25 — End: 2020-12-31

## 2020-12-25 MED ORDER — BENZONATATE 100 MG PO CAPS
100.0000 mg | ORAL_CAPSULE | Freq: Three times a day (TID) | ORAL | 0 refills | Status: DC | PRN
Start: 2020-12-25 — End: 2020-12-31

## 2020-12-25 MED ORDER — IPRATROPIUM BROMIDE 0.03 % NA SOLN: 2.0000 | Freq: Two times a day (BID) | NASAL | 12 refills | Status: DC

## 2020-12-25 NOTE — Patient Instructions (Signed)
Melanie Mccoy, thank you for joining Margaretann Loveless, PA-C for today's virtual visit.  While this provider is not your primary care provider (PCP), if your PCP is located in our provider database this encounter information will be shared with them immediately following your visit.  Consent: (Patient) Melanie Mccoy provided verbal consent for this virtual visit at the beginning of the encounter.  Current Medications:  Current Outpatient Medications:    benzonatate (TESSALON) 100 MG capsule, Take 1 capsule (100 mg total) by mouth 3 (three) times daily as needed., Disp: 30 capsule, Rfl: 0   ipratropium (ATROVENT) 0.03 % nasal spray, Place 2 sprays into both nostrils every 12 (twelve) hours., Disp: 30 mL, Rfl: 12   norgestimate-ethinyl estradiol (MILI) 0.25-35 MG-MCG tablet, Take 1 tablet by mouth daily., Disp: 28 tablet, Rfl: 11   promethazine-dextromethorphan (PROMETHAZINE-DM) 6.25-15 MG/5ML syrup, Take 5 mLs by mouth 4 (four) times daily as needed for cough., Disp: 118 mL, Rfl: 0   amLODipine (NORVASC) 5 MG tablet, Take 1 tablet (5 mg total) by mouth in the morning., Disp: 90 tablet, Rfl: 1   atorvastatin (LIPITOR) 10 MG tablet, Take 1 tablet (10 mg total) by mouth daily., Disp: 90 tablet, Rfl: 1   doxycycline (VIBRAMYCIN) 100 MG capsule, Take 1 capsule (100 mg total) by mouth 2 (two) times daily for 7 days., Disp: 14 capsule, Rfl: 0   glipiZIDE (GLUCOTROL XL) 10 MG 24 hr tablet, TAKE 1 TABLET BY MOUTH EVERY MORNING, Disp: 60 tablet, Rfl: 0   glucose blood test strip, Use as instructed, Disp: 50 each, Rfl: 12   Insulin Pen Needle (BD PEN NEEDLE NANO 2ND GEN) 32G X 4 MM MISC, Use 2x per day as directed., Disp: 100 each, Rfl: 2   Lancets (ACCU-CHEK SOFT TOUCH) lancets, Use as instructed, Disp: 50 each, Rfl: 12   LEVEMIR FLEXTOUCH 100 UNIT/ML FlexTouch Pen, ADMINISTER 75 UNITS UNDER THE SKIN AT BEDTIME, Disp: 75 mL, Rfl: 0   lisinopril (ZESTRIL) 10 MG tablet, Take 1 tablet (10 mg  total) by mouth daily., Disp: 90 tablet, Rfl: 1   metFORMIN (GLUCOPHAGE) 500 MG tablet, TAKE 1 TABLET(500 MG) BY MOUTH TWICE DAILY WITH A MEAL, Disp: 60 tablet, Rfl: 1   TRULICITY 1.5 MG/0.5ML SOPN, ADMINISTER 1.5 MG UNDER THE SKIN 1 TIME A WEEK, Disp: 2 mL, Rfl: 0   Medications ordered in this encounter:  Meds ordered this encounter  Medications   ipratropium (ATROVENT) 0.03 % nasal spray    Sig: Place 2 sprays into both nostrils every 12 (twelve) hours.    Dispense:  30 mL    Refill:  12    Order Specific Question:   Supervising Provider    Answer:   Hyacinth Meeker, BRIAN [[id number]]   benzonatate (TESSALON) 100 MG capsule    Sig: Take 1 capsule (100 mg total) by mouth 3 (three) times daily as needed.    Dispense:  30 capsule    Refill:  0    Order Specific Question:   Supervising Provider    Answer:   Eber Hong [[id number]]   promethazine-dextromethorphan (PROMETHAZINE-DM) 6.25-15 MG/5ML syrup    Sig: Take 5 mLs by mouth 4 (four) times daily as needed for cough.    Dispense:  118 mL    Refill:  0    Order Specific Question:   Supervising Provider    Answer:   Hyacinth Meeker, BRIAN [[id number]]     *If you need refills on other medications prior to your  next appointment, please contact your pharmacy*  Follow-Up: Call back or seek an in-person evaluation if the symptoms worsen or if the condition fails to improve as anticipated.  Other Instructions Upper Respiratory Infection, Adult An upper respiratory infection (URI) affects the nose, throat, and upper air passages. URIs are caused by germs (viruses). The most common type of URI is often called "the common cold." Medicines cannot cure URIs, but you can do things at home to relieve your symptoms. URIs usually get better within 7-10 days. Follow these instructions at home: Activity Rest as needed. If you have a fever, stay home from work or school until your fever is gone, or until your doctor says you may return to work or school. You should stay home  until you cannot spread the infection anymore (you are not contagious). Your doctor may have you wear a face mask so you have less risk of spreading the infection. Relieving symptoms Gargle with a salt-water mixture 3-4 times a day or as needed. To make a salt-water mixture, completely dissolve -1 tsp of salt in 1 cup of warm water. Use a cool-mist humidifier to add moisture to the air. This can help you breathe more easily. Eating and drinking  Drink enough fluid to keep your pee (urine) pale yellow. Eat soups and other clear broths. General instructions  Take over-the-counter and prescription medicines only as told by your doctor. These include cold medicines, fever reducers, and cough suppressants. Do not use any products that contain nicotine or tobacco. These include cigarettes and e-cigarettes. If you need help quitting, ask your doctor. Avoid being where people are smoking (avoid secondhand smoke). Make sure you get regular shots and get the flu shot every year. Keep all follow-up visits as told by your doctor. This is important. How to avoid spreading infection to others  Wash your hands often with soap and water. If you do not have soap and water, use hand sanitizer. Avoid touching your mouth, face, eyes, or nose. Cough or sneeze into a tissue or your sleeve or elbow. Do not cough or sneeze into your hand or into the air. Contact a doctor if: You are getting worse, not better. You have any of these: A fever. Chills. Brown or red mucus in your nose. Yellow or brown fluid (discharge)coming from your nose. Pain in your face, especially when you bend forward. Swollen neck glands. Pain with swallowing. White areas in the back of your throat. Get help right away if: You have shortness of breath that gets worse. You have very bad or constant: Headache. Ear pain. Pain in your forehead, behind your eyes, and over your cheekbones (sinus pain). Chest pain. You have long-lasting  (chronic) lung disease along with any of these: Wheezing. Long-lasting cough. Coughing up blood. A change in your usual mucus. You have a stiff neck. You have changes in your: Vision. Hearing. Thinking. Mood. Summary An upper respiratory infection (URI) is caused by a germ called a virus. The most common type of URI is often called "the common cold." URIs usually get better within 7-10 days. Take over-the-counter and prescription medicines only as told by your doctor. This information is not intended to replace advice given to you by your health care provider. Make sure you discuss any questions you have with your health care provider. Document Revised: 10/31/2019 Document Reviewed: 10/31/2019 Elsevier Patient Education  2022 [employer].    If you have been instructed to have an in-person evaluation today at a local Urgent  Care facility, please use the link below. It will take you to a list of all of our available [facility], including address, phone number and hours of operation. Please do not delay care.  [facility]  If you or a family member do not have a primary care provider, use the link below to schedule a visit and establish care. When you choose a [facility] primary care physician or advanced practice provider, you gain a long-term partner in health. Find a Primary Care Provider  Learn more about 's in-office and virtual care options: [facility]

## 2020-12-25 NOTE — Progress Notes (Signed)
Virtual Visit Consent   Melanie Mccoy, you are scheduled for a virtual visit with a [facility] provider today.     Just as with appointments in the office, your consent must be obtained to participate.  Your consent will be active for this visit and any virtual visit you may have with one of our providers in the next 365 days.     If you have a MyChart account, a copy of this consent can be sent to you electronically.  All virtual visits are billed to your insurance company just like a traditional visit in the office.    As this is a virtual visit, video technology does not allow for your provider to perform a traditional examination.  This may limit your provider's ability to fully assess your condition.  If your provider identifies any concerns that need to be evaluated in person or the need to arrange testing (such as labs, EKG, etc.), we will make arrangements to do so.     Although advances in technology are sophisticated, we cannot ensure that it will always work on either your end or our end.  If the connection with a video visit is poor, the visit may have to be switched to a telephone visit.  With either a video or telephone visit, we are not always able to ensure that we have a secure connection.     I need to obtain your verbal consent now.   Are you willing to proceed with your visit today?    Faylene Allerton has provided verbal consent on 12/25/2020 for a virtual visit (video or telephone).   Margaretann Loveless, PA-C   Date: 12/25/2020 5:03 PM   Virtual Visit via Video Note   I, Margaretann Loveless, connected with  Melanie Mccoy  ([id number], [birth year]) on 12/25/20 at  5:00 PM EDT by a video-enabled telemedicine application and verified that I am speaking with the correct person using two identifiers.  Location: Patient: Virtual Visit Location Patient: Home Provider: Virtual Visit Location Provider: Home Office   I discussed the limitations of evaluation and  management by telemedicine and the availability of in person appointments. The patient expressed understanding and agreed to proceed.    History of Present Illness: Melanie Mccoy is a 30 y.o. who identifies as a female who was assigned female at birth, and is being seen today for possible flu. At home covid testing is negative. Rhinorrhea, congestion, cough, fatigue, body aches, headache.  Symptoms present over 1 week. Started last Thursday. Nighttime and early morning is worst for cough and drainage.    Problems:  Patient Active Problem List   Diagnosis Date Noted   Essential hypertension 09/18/2019   Acute respiratory failure with hypoxia (HCC) 07/09/2019   COVID-19 virus infection 07/09/2019   Hyperlipidemia associated with type 2 diabetes mellitus (HCC) 02/04/2017   Type 2 diabetes mellitus without complication, without long-term current use of insulin (HCC) 01/19/2016   Acne vulgaris 04/15/2014   Morbid obesity (HCC) 04/15/2014   PCOS (polycystic ovarian syndrome) 04/15/2014   Status post primary low transverse cesarean section 11/22/2012    Allergies:  Allergies  Allergen Reactions   Bee Venom Swelling    Progressively worse with each sting   Ibuprofen Hives   Nickel Rash   Medications:  Current Outpatient Medications:    benzonatate (TESSALON) 100 MG capsule, Take 1 capsule (100 mg total) by mouth 3 (three) times daily as needed., Disp: 30  capsule, Rfl: 0   ipratropium (ATROVENT) 0.03 % nasal spray, Place 2 sprays into both nostrils every 12 (twelve) hours., Disp: 30 mL, Rfl: 12   norgestimate-ethinyl estradiol (MILI) 0.25-35 MG-MCG tablet, Take 1 tablet by mouth daily., Disp: 28 tablet, Rfl: 11   promethazine-dextromethorphan (PROMETHAZINE-DM) 6.25-15 MG/5ML syrup, Take 5 mLs by mouth 4 (four) times daily as needed for cough., Disp: 118 mL, Rfl: 0   amLODipine (NORVASC) 5 MG tablet, Take 1 tablet (5 mg total) by mouth in the morning., Disp: 90 tablet, Rfl: 1    atorvastatin (LIPITOR) 10 MG tablet, Take 1 tablet (10 mg total) by mouth daily., Disp: 90 tablet, Rfl: 1   doxycycline (VIBRAMYCIN) 100 MG capsule, Take 1 capsule (100 mg total) by mouth 2 (two) times daily for 7 days., Disp: 14 capsule, Rfl: 0   glipiZIDE (GLUCOTROL XL) 10 MG 24 hr tablet, TAKE 1 TABLET BY MOUTH EVERY MORNING, Disp: 60 tablet, Rfl: 0   glucose blood test strip, Use as instructed, Disp: 50 each, Rfl: 12   Insulin Pen Needle (BD PEN NEEDLE NANO 2ND GEN) 32G X 4 MM MISC, Use 2x per day as directed., Disp: 100 each, Rfl: 2   Lancets (ACCU-CHEK SOFT TOUCH) lancets, Use as instructed, Disp: 50 each, Rfl: 12   LEVEMIR FLEXTOUCH 100 UNIT/ML FlexTouch Pen, ADMINISTER 75 UNITS UNDER THE SKIN AT BEDTIME, Disp: 75 mL, Rfl: 0   lisinopril (ZESTRIL) 10 MG tablet, Take 1 tablet (10 mg total) by mouth daily., Disp: 90 tablet, Rfl: 1   metFORMIN (GLUCOPHAGE) 500 MG tablet, TAKE 1 TABLET(500 MG) BY MOUTH TWICE DAILY WITH A MEAL, Disp: 60 tablet, Rfl: 1   TRULICITY 1.5 MG/0.5ML SOPN, ADMINISTER 1.5 MG UNDER THE SKIN 1 TIME A WEEK, Disp: 2 mL, Rfl: 0  Observations/Objective: Patient is well-developed, well-nourished in no acute distress.  Resting comfortably at home.  Head is normocephalic, atraumatic.  No labored breathing.  Speech is clear and coherent with logical content.  Patient is alert and oriented at baseline.    Assessment and Plan: 1. Viral URI with cough - ipratropium (ATROVENT) 0.03 % nasal spray; Place 2 sprays into both nostrils every 12 (twelve) hours.  Dispense: 30 mL; Refill: 12 - benzonatate (TESSALON) 100 MG capsule; Take 1 capsule (100 mg total) by mouth 3 (three) times daily as needed.  Dispense: 30 capsule; Refill: 0 - promethazine-dextromethorphan (PROMETHAZINE-DM) 6.25-15 MG/5ML syrup; Take 5 mLs by mouth 4 (four) times daily as needed for cough.  Dispense: 118 mL; Refill: 0  - Suspect viral infection - Will treat with ipratropium nasal spray for congestion and  drainage - Tessalon perles and Promethazine DM for cough - Push fluids - Rest as needed - Call or seek in person evaluation if worsening or fails to improve.  Follow Up Instructions: I discussed the assessment and treatment plan with the patient. The patient was provided an opportunity to ask questions and all were answered. The patient agreed with the plan and demonstrated an understanding of the instructions.  A copy of instructions were sent to the patient via MyChart unless otherwise noted below.    The patient was advised to call back or seek an in-person evaluation if the symptoms worsen or if the condition fails to improve as anticipated.  Time:  I spent 10 minutes with the patient via telehealth technology discussing the above problems/concerns.    Margaretann Loveless, PA-C

## 2020-12-31 ENCOUNTER — Encounter: Payer: Self-pay | Admitting: Nutrition

## 2020-12-31 ENCOUNTER — Other Ambulatory Visit: Payer: Self-pay

## 2020-12-31 ENCOUNTER — Ambulatory Visit: Payer: 59 | Admitting: Nurse Practitioner

## 2020-12-31 ENCOUNTER — Ambulatory Visit: Payer: 59 | Admitting: Family Medicine

## 2020-12-31 ENCOUNTER — Encounter: Payer: 59 | Attending: Nurse Practitioner | Admitting: Nutrition

## 2020-12-31 VITALS — BP 138/88 | HR 77 | Ht 64.5 in | Wt 215.6 lb

## 2020-12-31 DIAGNOSIS — E785 Hyperlipidemia, unspecified: Secondary | ICD-10-CM | POA: Insufficient documentation

## 2020-12-31 DIAGNOSIS — E1169 Type 2 diabetes mellitus with other specified complication: Secondary | ICD-10-CM | POA: Diagnosis present

## 2020-12-31 DIAGNOSIS — E282 Polycystic ovarian syndrome: Secondary | ICD-10-CM | POA: Insufficient documentation

## 2020-12-31 DIAGNOSIS — L02214 Cutaneous abscess of groin: Secondary | ICD-10-CM | POA: Diagnosis not present

## 2020-12-31 DIAGNOSIS — E119 Type 2 diabetes mellitus without complications: Secondary | ICD-10-CM | POA: Diagnosis present

## 2020-12-31 DIAGNOSIS — I10 Essential (primary) hypertension: Secondary | ICD-10-CM | POA: Diagnosis not present

## 2020-12-31 MED ORDER — SULFAMETHOXAZOLE-TRIMETHOPRIM 800-160 MG PO TABS: 1.0000 | ORAL_TABLET | Freq: Two times a day (BID) | ORAL | 0 refills | Status: DC

## 2020-12-31 NOTE — Progress Notes (Signed)
Subjective:  Patient ID: Melanie Mccoy, female    DOB: 03-10-1990  Age: 30 y.o. MRN: [id number]  CC: Chief Complaint  Patient presents with   Patient states she needs note to return to work    Patient states she need note to return to work- had flu like symptoms since October 13th - feeling better and needs note to return to work Patient also has place on bottom of stomach- thinks she may need antibiotic     HPI:  30 year old female presents for evaluation after recent illness so that she can return to work.  She also is concerned that she has an abscess.  Patient states that she suffered a recent respiratory illness which kept her from going to work.  She is now back to normal and is feeling well.  She needs a note so that she can return to work.  Patient reports that she recently has had an abscess to the right groin.  She was recently seen in urgent care on 10/17.  Was treated with doxycycline.  No incision and drainage was performed.  Patient states that this has been worsening as of late.  She states that it initially drained but then the wound closed up and she believes that she has developed another abscess.  The area is not tender.  However, it is raised and red.  No fever.  No other associated symptoms.  No other complaints.  Patient Active Problem List   Diagnosis Date Noted   Abscess, groin 12/31/2020   Essential hypertension 09/18/2019   Type 2 diabetes mellitus without complication, without long-term current use of insulin (HCC) 01/19/2016   Acne vulgaris 04/15/2014   Morbid obesity (HCC) 04/15/2014   PCOS (polycystic ovarian syndrome) 04/15/2014   Status post primary low transverse cesarean section 11/22/2012    Social Hx   Social History   Socioeconomic History   Marital status: Single    Spouse name: Not on file   Number of children: Not on file   Years of education: Not on file   Highest education level: Not on file  Occupational History   Not on file   Tobacco Use   Smoking status: Never   Smokeless tobacco: Never  Vaping Use   Vaping Use: Never used  Substance and Sexual Activity   Alcohol use: No    Alcohol/week: 0.0 standard drinks   Drug use: No   Sexual activity: Yes  Other Topics Concern   Not on file  Social History Narrative   Not on file   Social Determinants of Health   Financial Resource Strain: Not on file  Food Insecurity: Not on file  Transportation Needs: Not on file  Physical Activity: Not on file  Stress: Not on file  Social Connections: Not on file    Review of Systems  Constitutional: Negative.   HENT: Negative.    Skin:        Abscess.    Objective:  BP 138/88   Pulse 77   Ht 5' 4.5" (1.638 m)   Wt 215 lb 9.6 oz (97.8 kg)   SpO2 100%   BMI 36.44 kg/m   BP/Weight 12/31/2020 12/21/2020 11/05/2020  Systolic BP 138 135 133  Diastolic BP 88 95 96  Wt. (Lbs) 215.6 - -  BMI 36.44 - -    Physical Exam Vitals and nursing note reviewed.  Constitutional:      General: She is not in acute distress.    Appearance: Normal appearance.  She is not ill-appearing.  HENT:     Head: Normocephalic and atraumatic.  Cardiovascular:     Rate and Rhythm: Normal rate and regular rhythm.  Pulmonary:     Effort: Pulmonary effort is normal.     Breath sounds: Normal breath sounds. No wheezing or rales.  Skin:    Comments: Right inguinal region with rather large abscess.  Erythema, induration and fluctuance noted.  Neurological:     Mental Status: She is alert.  Psychiatric:        Mood and Affect: Mood normal.        Behavior: Behavior normal.    Lab Results  Component Value Date   WBC 8.7 01/29/2020   HGB 14.6 01/29/2020   HCT 42.5 01/29/2020   PLT 298 01/29/2020   GLUCOSE 202 (H) 09/25/2020   CHOL 218 (H) 01/29/2020   TRIG 353 (H) 01/29/2020   HDL 33 (L) 01/29/2020   LDLCALC 123 (H) 01/29/2020   ALT 26 09/25/2020   AST 26 09/25/2020   NA 135 09/25/2020   K 4.5 09/25/2020   CL 97 09/25/2020    CREATININE 0.79 09/25/2020   BUN 14 09/25/2020   CO2 21 09/25/2020   TSH 2.00 01/14/2016   INR 1.15 02/11/2011   HGBA1C 11.3 (A) 09/28/2020   Procedure: Incision and drainage of abscess Area was cleansed with Betadine. Local anesthesia was achieved with 1% lidocaine with epinephrine. 11 blade was used to make a small incision.  Copious amounts of purulent drainage was exuded from the incision. Wound was packed with 1/4 inch packing. Patient tolerated the procedure well.  No complications.  Minimal bleeding.   Assessment & Plan:   Problem List Items Addressed This Visit       Other   Abscess, groin - Primary    Incision and drainage performed today.  Wound was packed.  Advised to keep area clean.  Patient can remove packing in 48 hours.  Placing on Bactrim.       Meds ordered this encounter  Medications   sulfamethoxazole-trimethoprim (BACTRIM DS) 800-160 MG tablet    Sig: Take 1 tablet by mouth 2 (two) times daily.    Dispense:  14 tablet    Refill:  0    Loretta Kluender DO [facility]

## 2020-12-31 NOTE — Patient Instructions (Signed)
Okay to return to work.  Remove the pack in 48 hours.  Call with concerns.  Antibiotic as prescribed.  Take care  Dr. Adriana Simas

## 2020-12-31 NOTE — Progress Notes (Signed)
Medical Nutrition Therapy  Follow up 6295  End-0935  Dx 5 yrs  Ago. Sees  Melanie Reardron,FNP At [facility] NUTRITION ASSESSMENT   Was put on basaglar from lantus and she is concerned her BS are higher even though she is trying to eat better. FBS: 160-260's   Night time BS"s 230-270's Still working on eating meals Got FT job and not as stressed and is able to now get into a better routine and meal schedule. Sick since 13th of Oct. and her BS have gone up slightly. Sees PCP today. Changes made: Eating breakfast now-protein shake with fruit Would benefit from a Libre so she can better assess her blood sugars control and better self management. Forgot to do blood work til this am. Will r/s appt with Melanie for when her labs come back. Lab Results  Component Value Date   HGBA1C 11.3 (A) 09/28/2020   Has been testing 4 times per day. Has been working on eating meals on time.  Referral diagnosis: e11.8, e78.0 Preferred learning style:  no preference indicated Learning readiness: change in progress  Anthropometrics  Wt Readings from Last 3 Encounters:  09/28/20 206 lb 6.4 oz (93.6 kg)  08/24/20 210 lb (95.3 kg)  06/23/20 204 lb 3.2 oz (92.6 kg)   Ht Readings from Last 3 Encounters:  09/28/20 5' 4.5" (1.638 m)  08/24/20 5' 3.5" (1.613 m)  06/23/20 5' 4.5" (1.638 m)   There is no height or weight on file to calculate BMI. @BMIFA @ Facility age limit for growth percentiles is 20 years. Facility age limit for growth percentiles is 20 years. Clinical Medical Hx: HTN and Hyperlipidemai Medications: Levermir to 65 units daily, Trulicity weekly, metformin 500 BID and Glipizide 10 mg Labs: FBS: 160-300  Bedtime: 240's.  Lab Results  Component Value Date   HGBA1C 11.3 (A) 09/28/2020   CMP Latest Ref Rng & Units 09/25/2020 01/29/2020 10/24/2019  Glucose 65 - 99 mg/dL 10/26/2019) 284(X) 324(M)  BUN 6 - 20 mg/dL 14 8 14   Creatinine 0.57 - 1.00 mg/dL 010(U 7.25  Sodium 134 -  144 mmol/L 135 139 139  Potassium 3.5 - 5.2 mmol/L 4.5 3.8 4.4  Chloride 96 - 106 mmol/L 97 99 101  CO2 20 - 29 mmol/L 21 22 21   Calcium 8.7 - 10.2 mg/dL 9.5 9.4 9.6  Total Protein 6.0 - 8.5 g/dL 6.7 7.0 6.6  Total Bilirubin 0.0 - 1.2 mg/dL 0.4 0.2 3.66  Alkaline Phos 44 - 121 IU/L 73 77 68  AST 0 - 40 IU/L 26 25 17   ALT 0 - 32 IU/L 26 49(H) 22   Lipid Panel     Component Value Date/Time   CHOL 218 (H) 01/29/2020 0926   TRIG 353 (H) 01/29/2020 0926   HDL 33 (L) 01/29/2020 0926   CHOLHDL 6.6 (H) 01/29/2020 0926   CHOLHDL 5.1 07/10/2019 0506   VLDL 32 07/10/2019 0506   LDLCALC 123 (H) 01/29/2020 0926   LABVLDL 62 (H) 01/29/2020 0926   Notable Signs/Symptoms: Fatigue, increased thirst, frequent urination, blurry vision  Lifestyle & Dietary Hx She works 1 job now and stress is getting better.  Estimated daily fluid intake: 100 oz Supplements: none Sleep: 4-7 hrs  Stress / self-care: jobs are stressing Current average weekly physical activity: 2 hours    Estimated Energy Needs Calories: 1200-1400 Carbohydrate: 135g Protein: 90g Fat: 33g   NUTRITION DIAGNOSIS  NB-1.1 Food and nutrition-related knowledge deficit As related to Diabetes Type 2.  As evidenced by  A1C 10.4%.  NUTRITION INTERVENTION  Meal planning, prevention of complications of DM. How insulin works and benefits of short acting insulin.   Reviewed emotional eating, need to focus on more nutrient dense food choices and not skipping meals to lose weight.  Handouts Provided Include  Emotional eating  Learning Style & Readiness for Change Teaching method utilized: Visual & Auditory  Demonstrated degree of understanding via: Teach Back  Barriers to learning/adherence to lifestyle change: None  Goals Established by Pt Talk to provider about a libre Increase low carb vegetables with meals Drink water only Cut out snacks between meals unless BS low. Get A1C Down to 8% Focus on eating more plant based foods  for improved health.   MONITORING & EVALUATION Dietary intake, weekly physical activity, and blood sugars  in 1 month. Recommend meal time insulin if BS are not improved by next visit. Next Steps  Will continue to work on meal planning.

## 2020-12-31 NOTE — Assessment & Plan Note (Signed)
Incision and drainage performed today.  Wound was packed.  Advised to keep area clean.  Patient can remove packing in 48 hours.  Placing on Bactrim.

## 2020-12-31 NOTE — Patient Instructions (Signed)
  Goals Established by Pt Talk to provider about a libre Increase low carb vegetables with meals Drink water only Cut out snacks between meals unless BS low. Get A1C Down to 8% Focus on eating more plant based foods for improved health.

## 2020-12-31 NOTE — Patient Instructions (Signed)

## 2021-01-01 ENCOUNTER — Encounter: Payer: Self-pay | Admitting: Nurse Practitioner

## 2021-01-01 ENCOUNTER — Ambulatory Visit: Payer: 59 | Admitting: Nurse Practitioner

## 2021-01-01 VITALS — BP 132/95 | HR 97 | Ht 64.5 in | Wt 215.4 lb

## 2021-01-01 DIAGNOSIS — I10 Essential (primary) hypertension: Secondary | ICD-10-CM | POA: Diagnosis not present

## 2021-01-01 DIAGNOSIS — E785 Hyperlipidemia, unspecified: Secondary | ICD-10-CM | POA: Diagnosis not present

## 2021-01-01 DIAGNOSIS — E1169 Type 2 diabetes mellitus with other specified complication: Secondary | ICD-10-CM | POA: Diagnosis not present

## 2021-01-01 DIAGNOSIS — E119 Type 2 diabetes mellitus without complications: Secondary | ICD-10-CM

## 2021-01-01 LAB — COMPREHENSIVE METABOLIC PANEL
ALT: 26 IU/L (ref 0–32)
AST: 19 IU/L (ref 0–40)
Albumin/Globulin Ratio: 1.4 (ref 1.2–2.2)
Albumin: 3.9 g/dL (ref 3.9–5.0)
Alkaline Phosphatase: 82 IU/L (ref 44–121)
BUN/Creatinine Ratio: 21 (ref 9–23)
BUN: 13 mg/dL (ref 6–20)
Bilirubin Total: <0.2 mg/dL (ref 0.0–1.2)
CO2: 22 mmol/L (ref 20–29)
Calcium: 9.3 mg/dL (ref 8.7–10.2)
Chloride: 97 mmol/L (ref 96–106)
Creatinine, Ser: 0.63 mg/dL (ref 0.57–1.00)
Globulin, Total: 2.8 g/dL (ref 1.5–4.5)
Glucose: 222 mg/dL — ABNORMAL HIGH (ref 70–99)
Potassium: 3.9 mmol/L (ref 3.5–5.2)
Sodium: 136 mmol/L (ref 134–144)
Total Protein: 6.7 g/dL (ref 6.0–8.5)
eGFR: 124 mL/min/{1.73_m2} (ref >59)

## 2021-01-01 LAB — LIPID PANEL
Chol/HDL Ratio: 3.8 ratio (ref 0.0–4.4)
Cholesterol, Total: 132 mg/dL (ref 100–199)
HDL: 35 mg/dL — ABNORMAL LOW (ref >39)
LDL Chol Calc (NIH): 63 mg/dL (ref 0–99)
Triglycerides: 205 mg/dL — ABNORMAL HIGH (ref 0–149)
VLDL Cholesterol Cal: 34 mg/dL (ref 5–40)

## 2021-01-01 LAB — POCT GLYCOSYLATED HEMOGLOBIN (HGB A1C): HbA1c, POC (controlled diabetic range): 11.6 % — AB (ref 0.0–7.0)

## 2021-01-01 LAB — POCT UA - MICROALBUMIN
Creatinine, POC: 300 mg/dL
Microalbumin Ur, POC: 80 mg/L

## 2021-01-01 LAB — VITAMIN D 25 HYDROXY (VIT D DEFICIENCY, FRACTURES): Vit D, 25-Hydroxy: 33.6 ng/mL (ref 30.0–100.0)

## 2021-01-01 LAB — TSH: TSH: 3.18 u[IU]/mL (ref 0.450–4.500)

## 2021-01-01 LAB — T4, FREE: Free T4: 1.23 ng/dL (ref 0.82–1.77)

## 2021-01-01 MED ORDER — AMLODIPINE BESYLATE 5 MG PO TABS: 5.0000 mg | ORAL_TABLET | Freq: Every morning | ORAL | 1 refills | Status: DC

## 2021-01-01 MED ORDER — TOUJEO MAX SOLOSTAR 300 UNIT/ML ~~LOC~~ SOPN: 70.0000 [IU] | PEN_INJECTOR | Freq: Every evening | SUBCUTANEOUS | 3 refills | Status: DC

## 2021-01-01 MED ORDER — FIASP FLEXTOUCH 100 UNIT/ML ~~LOC~~ SOPN: 10.0000 [IU] | PEN_INJECTOR | Freq: Three times a day (TID) | SUBCUTANEOUS | 3 refills | Status: DC

## 2021-01-01 MED ORDER — LISINOPRIL 10 MG PO TABS: 10.0000 mg | ORAL_TABLET | Freq: Every day | ORAL | 1 refills | Status: DC

## 2021-01-01 MED ORDER — METFORMIN HCL 500 MG PO TABS: ORAL_TABLET | ORAL | 3 refills | Status: DC

## 2021-01-01 MED ORDER — ATORVASTATIN CALCIUM 10 MG PO TABS: 10.0000 mg | ORAL_TABLET | Freq: Every day | ORAL | 1 refills | Status: DC

## 2021-01-01 MED ORDER — TRULICITY 1.5 MG/0.5ML ~~LOC~~ SOAJ: SUBCUTANEOUS | 3 refills | Status: DC

## 2021-01-01 NOTE — Progress Notes (Signed)
Endocrinology Follow Up Visit       01/01/2021, 12:10 PM   Subjective:    Patient ID: Melanie Mccoy, female    DOB: 1990-08-17.  Melanie Mccoy is being seen in follow up after being seen in consultation for management of currently uncontrolled symptomatic diabetes requested by  Tommie Sams, DO.   Past Medical History:  Diagnosis Date   Anemia    slightly anemic, iron supplements every 2-3 days   Bartholin cyst    Chlamydia 01/2011   Diabetes mellitus without complication (HCC)    Family history of anesthesia complication    grandmother had spinal with nerve damage   No pertinent past medical history     Past Surgical History:  Procedure Laterality Date   CESAREAN SECTION N/A 11/22/2012   Procedure: PRIMARY CESAREAN SECTION;  Surgeon: Esmeralda Arthur, MD;  Location: [facility];  Service: Obstetrics;  Laterality: N/A;   CYST REMOVAL TRUNK  2005    Social History   Socioeconomic History   Marital status: Single    Spouse name: Not on file   Number of children: Not on file   Years of education: Not on file   Highest education level: Not on file  Occupational History   Not on file  Tobacco Use   Smoking status: Never   Smokeless tobacco: Never  Vaping Use   Vaping Use: Never used  Substance and Sexual Activity   Alcohol use: No    Alcohol/week: 0.0 standard drinks   Drug use: No   Sexual activity: Yes  Other Topics Concern   Not on file  Social History Narrative   Not on file   Social Determinants of Health   Financial Resource Strain: Not on file  Food Insecurity: Not on file  Transportation Needs: Not on file  Physical Activity: Not on file  Stress: Not on file  Social Connections: Not on file    Family History  Problem Relation Age of Onset   Cancer Other    Heart disease Other    Diabetes Other    Diabetes Mother    Diabetes Father    Diabetes Maternal Grandfather      Outpatient Encounter Medications as of 01/01/2021  Medication Sig   glipiZIDE (GLUCOTROL XL) 10 MG 24 hr tablet TAKE 1 TABLET BY MOUTH EVERY MORNING   glucose blood test strip Use as instructed   insulin aspart (FIASP FLEXTOUCH) 100 UNIT/ML FlexTouch Pen Inject 10-16 Units into the skin 3 (three) times daily before meals.   insulin glargine, 2 Unit Dial, (TOUJEO MAX SOLOSTAR) 300 UNIT/ML Solostar Pen Inject 70 Units into the skin at bedtime.   Insulin Pen Needle (BD PEN NEEDLE NANO 2ND GEN) 32G X 4 MM MISC Use 2x per day as directed.   Lancets (ACCU-CHEK SOFT TOUCH) lancets Use as instructed   norgestimate-ethinyl estradiol (MILI) 0.25-35 MG-MCG tablet Take 1 tablet by mouth daily.   sulfamethoxazole-trimethoprim (BACTRIM DS) 800-160 MG tablet Take 1 tablet by mouth 2 (two) times daily.   [DISCONTINUED] amLODipine (NORVASC) 5 MG tablet Take 1 tablet (5 mg total) by mouth in the morning.   [DISCONTINUED] atorvastatin (LIPITOR) 10 MG tablet Take  1 tablet (10 mg total) by mouth daily.   [DISCONTINUED] Insulin Glargine (BASAGLAR KWIKPEN) 100 UNIT/ML SMARTSIG:75 Unit(s) SUB-Q Every Night   [DISCONTINUED] lisinopril (ZESTRIL) 10 MG tablet Take 1 tablet (10 mg total) by mouth daily.   [DISCONTINUED] metFORMIN (GLUCOPHAGE) 500 MG tablet TAKE 1 TABLET(500 MG) BY MOUTH TWICE DAILY WITH A MEAL   [DISCONTINUED] TRULICITY 1.5 MG/0.5ML SOPN ADMINISTER 1.5 MG UNDER THE SKIN 1 TIME A WEEK   amLODipine (NORVASC) 5 MG tablet Take 1 tablet (5 mg total) by mouth in the morning.   atorvastatin (LIPITOR) 10 MG tablet Take 1 tablet (10 mg total) by mouth daily.   Dulaglutide (TRULICITY) 1.5 MG/0.5ML SOPN ADMINISTER 1.5 MG UNDER THE SKIN 1 TIME A WEEK   ipratropium (ATROVENT) 0.03 % nasal spray Place 2 sprays into both nostrils every 12 (twelve) hours. (Patient not taking: Reported on 01/01/2021)   lisinopril (ZESTRIL) 10 MG tablet Take 1 tablet (10 mg total) by mouth daily.   metFORMIN (GLUCOPHAGE) 500 MG tablet  TAKE 1 TABLET(500 MG) BY MOUTH TWICE DAILY WITH A MEAL   [DISCONTINUED] LEVEMIR FLEXTOUCH 100 UNIT/ML FlexTouch Pen ADMINISTER 75 UNITS UNDER THE SKIN AT BEDTIME (Patient not taking: Reported on 01/01/2021)   No facility-administered encounter medications on file as of 01/01/2021.    ALLERGIES: Allergies  Allergen Reactions   Bee Venom Swelling    Progressively worse with each sting Other reaction(s): Other   Nickel Rash and Itching   Ibuprofen Hives    VACCINATION STATUS: Immunization History  Administered Date(s) Administered   Tdap 06/26/2006    Diabetes She presents for her follow-up diabetic visit. She has type 2 diabetes mellitus. Onset time: She was diagnosed at approx age of 65. Her disease course has been worsening. There are no hypoglycemic associated symptoms. Associated symptoms include fatigue, polydipsia and polyuria. Pertinent negatives for diabetes include no blurred vision and no polyphagia. There are no hypoglycemic complications. Symptoms are stable. There are no diabetic complications. Risk factors for coronary artery disease include diabetes mellitus, dyslipidemia, hypertension, obesity, sedentary lifestyle, family history and stress. Current diabetic treatment includes insulin injections and oral agent (dual therapy). She is compliant with treatment most of the time. Her weight is fluctuating minimally. She is following a generally unhealthy diet. When asked about meal planning, she reported none. She has not had a previous visit with a dietitian. She participates in exercise intermittently. Her home blood glucose trend is increasing steadily. Her breakfast blood glucose range is generally >200 mg/dl. Her overall blood glucose range is >200 mg/dl. (She presents today with her meter, no logs, showing gross hyperglycemia overall.  Her POCT A1c today is 11.6%, increasing from last visit of 11.3%.  She has had several skin abscesses recently and is still undergoing a lot of  stress after switching jobs.  She denies any hypoglycemia.  Analysis of her meter shows 7-day average of 236, 14, 30, 90-day averages are all 239.  She has not been consistent monitoring her blood glucose.) An ACE inhibitor/angiotensin II receptor blocker is being taken. She does not see a podiatrist.Eye exam is current.  Hypertension This is a chronic problem. The current episode started more than 1 year ago. The problem has been gradually improving since onset. The problem is controlled. Pertinent negatives include no blurred vision. There are no associated agents to hypertension. Risk factors for coronary artery disease include diabetes mellitus, dyslipidemia, obesity, sedentary lifestyle and stress. Past treatments include calcium channel blockers and ACE inhibitors. The current treatment provides  mild improvement. Compliance problems include diet and exercise.   Hyperlipidemia This is a chronic problem. The current episode started more than 1 year ago. The problem is uncontrolled. Recent lipid tests were reviewed and are high. Exacerbating diseases include diabetes and obesity. Factors aggravating her hyperlipidemia include fatty foods. Current antihyperlipidemic treatment includes statins. The current treatment provides mild improvement of lipids. Compliance problems include adherence to diet and adherence to exercise.  Risk factors for coronary artery disease include diabetes mellitus, dyslipidemia, hypertension, obesity, a sedentary lifestyle and stress.    Review of systems  Constitutional: + Minimally fluctuating body weight,  current Body mass index is 36.4 kg/m. , no fatigue, no subjective hyperthermia, no subjective hypothermia Eyes: no blurry vision, no xerophthalmia ENT: no sore throat, no nodules palpated in throat, no dysphagia/odynophagia, no hoarseness Cardiovascular: no chest pain, no shortness of breath, no palpitations, no leg swelling Respiratory: no cough, no shortness of  breath Gastrointestinal: no nausea/vomiting/diarrhea Musculoskeletal: no muscle/joint aches Skin: no rashes, no hyperemia Neurological: no tremors, no numbness, no tingling, no dizziness Psychiatric: no depression, no anxiety  Objective:     BP (!) 132/95   Pulse 97   Ht 5' 4.5" (1.638 m)   Wt 215 lb 6.4 oz (97.7 kg)   BMI 36.40 kg/m   Wt Readings from Last 3 Encounters:  01/01/21 215 lb 6.4 oz (97.7 kg)  12/31/20 215 lb 9.6 oz (97.8 kg)  09/28/20 206 lb 6.4 oz (93.6 kg)    BP Readings from Last 3 Encounters:  01/01/21 (!) 132/95  12/31/20 138/88  12/21/20 (!) 135/95      Physical Exam- Limited  Constitutional:  Body mass index is 36.4 kg/m. , not in acute distress, normal state of mind Eyes:  EOMI, no exophthalmos Neck: Supple Cardiovascular: RRR, no murmurs, rubs, or gallops, no edema Respiratory: Adequate breathing efforts, no crackles, rales, rhonchi, or wheezing Musculoskeletal: no gross deformities, strength intact in all four extremities, no gross restriction of joint movements Skin:  no rashes, no hyperemia Neurological: no tremor with outstretched hands    CMP ( most recent) CMP     Component Value Date/Time   NA 136 12/31/2020 0806   K 3.9 12/31/2020 0806   CL 97 12/31/2020 0806   CO2 22 12/31/2020 0806   GLUCOSE 222 (H) 12/31/2020 0806   GLUCOSE 318 (H) 07/10/2019 0506   BUN 13 12/31/2020 0806   CREATININE 0.63 12/31/2020 0806   CREATININE 0.77 01/14/2016 1514   CALCIUM 9.3 12/31/2020 0806   PROT 6.7 12/31/2020 0806   ALBUMIN 3.9 12/31/2020 0806   AST 19 12/31/2020 0806   ALT 26 12/31/2020 0806   ALKPHOS 82 12/31/2020 0806   BILITOT <0.2 12/31/2020 0806   GFRNONAA 118 01/29/2020 0926   GFRNONAA >89 01/14/2016 1514   GFRAA 136 01/29/2020 0926   GFRAA >89 01/14/2016 1514     Diabetic Labs (most recent): Lab Results  Component Value Date   HGBA1C 11.6 (A) 01/01/2021   HGBA1C 11.3 (A) 09/28/2020   HGBA1C 11.9 (A) 06/23/2020      Lipid Panel ( most recent) Lipid Panel     Component Value Date/Time   CHOL 132 12/31/2020 0806   TRIG 205 (H) 12/31/2020 0806   HDL 35 (L) 12/31/2020 0806   CHOLHDL 3.8 12/31/2020 0806   CHOLHDL 5.1 07/10/2019 0506   VLDL 32 07/10/2019 0506   LDLCALC 63 12/31/2020 0806   LABVLDL 34 12/31/2020 0806      Lab Results  Component  Value Date   TSH 3.180 12/31/2020   TSH 2.00 01/14/2016   FREET4 1.23 12/31/2020           Assessment & Plan:   1) Type 2 diabetes mellitus without complication, without long-term current use of insulin (HCC)  - Loma B Aveline Daus has currently uncontrolled symptomatic type 2 DM since 30 years of age.   Recent labs reviewed.  She presents today with her meter, no logs, showing gross hyperglycemia overall.  Her POCT A1c today is 11.6%, increasing from last visit of 11.3%.  She has had several skin abscesses recently and is still undergoing a lot of stress after switching jobs.  She denies any hypoglycemia.  Analysis of her meter shows 7-day average of 236, 14, 30, 90-day averages are all 239.  She has not been consistent monitoring her blood glucose.  - I had a long discussion with her about the progressive nature of diabetes and the pathology behind its complications.  -She does not currently have any complications from diabetes but she remains at a high risk for more acute and chronic complications which include CAD, CVA, CKD, retinopathy, and neuropathy. These are all discussed in detail with her.  - Nutritional counseling repeated at each appointment due to patients tendency to fall back in to old habits.  - The patient admits there is a room for improvement in their diet and drink choices. -  Suggestion is made for the patient to avoid simple carbohydrates from their diet including Cakes, Sweet Desserts / Pastries, Ice Cream, Soda (diet and regular), Sweet Tea, Candies, Chips, Cookies, Sweet Pastries, Store Bought Juices, Alcohol in Excess of 1-2  drinks a day, Artificial Sweeteners, Coffee Creamer, and "Sugar-free" Products. This will help patient to have stable blood glucose profile and potentially avoid unintended weight gain.   - I encouraged the patient to switch to unprocessed or minimally processed complex starch and increased protein intake (animal or plant source), fruits, and vegetables.   - Patient is advised to stick to a routine mealtimes to eat 3 meals a day and avoid unnecessary snacks (to snack only to correct hypoglycemia).  - she has been scheduled with Norm Salt, RDN, CDE for diabetes education.  - I have approached her with the following individualized plan to manage  her diabetes and patient agrees:   -Given her persistent hyperglycemia and loss of control, I discussed the need to add prandial insulin along with her current regimen and she reluctantly agreed.    -She is advised to continue Toujeo (switched from Basaglar due to problems with the pen) 70 units SQ nightly.  She is advised to start Fiasp 10-16 units TID with meals if glucose is above 90 and she is eating (Specific instructions on how to titrate insulin dosage based on glucose readings given to patient in writing).  She demonstrated her ability to use the SSI correctly with me today.  She can also continue her Metformin 500 mg twice daily with meals, Glipizide 10 mg XL daily with breakfast and Trulicity 1.5 mg SQ weekly.    -she is encouraged to consistently monitor blood glucose 4 times daily, before meals and before bed, and to call the clinic if she has readings less than 70 or above 300 for 3 tests in a row.  She is to come back in 2 weeks to review logs and adjust insulin.  Will be considered for CGM at next visit (sample of Libre 3 given to patient today).  - she  is warned not to take insulin without proper monitoring per orders. - Adjustment parameters are given to her for hypo and hyperglycemia in writing.  - Specific targets for  A1c;  LDL,  HDL,  and Triglycerides were discussed with the patient.  2) Blood Pressure /Hypertension:  Her blood pressure is not controlled to target.  She is advised to continue Amlodipine 5 mg po daily, and Lisinopril 10 mg po daily.   3) Lipids/Hyperlipidemia:    Her most recent lipid panel from 12/31/20 shows improved and controlled LDL of 63 and elevated but improved triglycerides of 205.   She is advised to continue Lipitor 10 mg po daily at bedtime.  Side effects and precautions discussed with her.  She was advised to avoid fried foods and butter.    4)  Weight/Diet:  her Body mass index is 36.4 kg/m.  - clearly complicating her diabetes care.   she is a candidate for weight loss. I discussed with her the fact that loss of 5 - 10% of her  current body weight will have the most impact on her diabetes management.  Exercise, and detailed carbohydrates information provided  -  detailed on discharge instructions.  5) Chronic Care/Health Maintenance: -she is on ACEI/ARB and Statin medications and is encouraged to initiate and continue to follow up with Ophthalmology, Dentist,  Podiatrist at least yearly or according to recommendations, and advised to stay away from smoking. I have recommended yearly flu vaccine and pneumonia vaccine at least every 5 years; moderate intensity exercise for up to 150 minutes weekly; and  sleep for at least 7 hours a day.  - she is  advised to maintain close follow up with Tommie Sams, DO for primary care needs, as well as her other providers for optimal and coordinated care.       I spent 44 minutes in the care of the patient today including review of labs from CMP, Lipids, Thyroid Function, Hematology (current and previous including abstractions from other facilities); face-to-face time discussing  her blood glucose readings/logs, discussing hypoglycemia and hyperglycemia episodes and symptoms, medications doses, her options of short and long term treatment based on the  latest standards of care / guidelines;  discussion about incorporating lifestyle medicine;  and documenting the encounter.    Please refer to Patient Instructions for Blood Glucose Monitoring and Insulin/Medications Dosing Guide"  in media tab for additional information. Please  also refer to " Patient Self Inventory" in the Media  tab for reviewed elements of pertinent patient history.  Gladstone Lighter Sams participated in the discussions, expressed understanding, and voiced agreement with the above plans.  All questions were answered to her satisfaction. she is encouraged to contact clinic should she have any questions or concerns prior to her return visit.  Follow up plan: - Return in about 2 weeks (around 01/15/2021) for Diabetes F/U, Bring meter and logs.  [name], [facility] [address] [place], [place] [address] Phone: [phone] Fax: [phone]  01/01/2021, 12:10 PM

## 2021-01-19 ENCOUNTER — Encounter: Payer: Self-pay | Admitting: Nurse Practitioner

## 2021-01-19 ENCOUNTER — Other Ambulatory Visit: Payer: Self-pay

## 2021-01-19 ENCOUNTER — Ambulatory Visit: Payer: 59 | Admitting: Nurse Practitioner

## 2021-01-19 VITALS — BP 133/87 | HR 82 | Ht 64.5 in | Wt 216.8 lb

## 2021-01-19 DIAGNOSIS — E785 Hyperlipidemia, unspecified: Secondary | ICD-10-CM | POA: Diagnosis not present

## 2021-01-19 DIAGNOSIS — I10 Essential (primary) hypertension: Secondary | ICD-10-CM | POA: Diagnosis not present

## 2021-01-19 DIAGNOSIS — E119 Type 2 diabetes mellitus without complications: Secondary | ICD-10-CM | POA: Diagnosis not present

## 2021-01-19 DIAGNOSIS — E1169 Type 2 diabetes mellitus with other specified complication: Secondary | ICD-10-CM

## 2021-01-19 MED ORDER — TOUJEO MAX SOLOSTAR 300 UNIT/ML ~~LOC~~ SOPN: 80.0000 [IU] | PEN_INJECTOR | Freq: Every evening | SUBCUTANEOUS | 3 refills | Status: DC

## 2021-01-19 MED ORDER — FIASP FLEXTOUCH 100 UNIT/ML ~~LOC~~ SOPN: 14.0000 [IU] | PEN_INJECTOR | Freq: Three times a day (TID) | SUBCUTANEOUS | 3 refills | Status: DC

## 2021-01-19 MED ORDER — FREESTYLE LIBRE 3 SENSOR MISC: 1.0000 "application " | 6 refills | Status: DC

## 2021-01-19 NOTE — Patient Instructions (Signed)

## 2021-01-19 NOTE — Progress Notes (Signed)
Endocrinology Follow Up Visit       01/19/2021, 11:54 AM   Subjective:    Patient ID: Melanie Mccoy, female    DOB: Nov 09, 1990.  Melanie Mccoy is being seen in follow up after being seen in consultation for management of currently uncontrolled symptomatic diabetes requested by  Tommie Sams, DO.   Past Medical History:  Diagnosis Date   Anemia    slightly anemic, iron supplements every 2-3 days   Bartholin cyst    Chlamydia 01/2011   Diabetes mellitus without complication (HCC)    Family history of anesthesia complication    grandmother had spinal with nerve damage   No pertinent past medical history     Past Surgical History:  Procedure Laterality Date   CESAREAN SECTION N/A 11/22/2012   Procedure: PRIMARY CESAREAN SECTION;  Surgeon: Esmeralda Arthur, MD;  Location: [facility];  Service: Obstetrics;  Laterality: N/A;   CYST REMOVAL TRUNK  2005    Social History   Socioeconomic History   Marital status: Single    Spouse name: Not on file   Number of children: Not on file   Years of education: Not on file   Highest education level: Not on file  Occupational History   Not on file  Tobacco Use   Smoking status: Never   Smokeless tobacco: Never  Vaping Use   Vaping Use: Never used  Substance and Sexual Activity   Alcohol use: No    Alcohol/week: 0.0 standard drinks   Drug use: No   Sexual activity: Yes  Other Topics Concern   Not on file  Social History Narrative   Not on file   Social Determinants of Health   Financial Resource Strain: Not on file  Food Insecurity: Not on file  Transportation Needs: Not on file  Physical Activity: Not on file  Stress: Not on file  Social Connections: Not on file    Family History  Problem Relation Age of Onset   Cancer Other    Heart disease Other    Diabetes Other    Diabetes Mother    Diabetes Father    Diabetes Maternal Grandfather      Outpatient Encounter Medications as of 01/19/2021  Medication Sig   Continuous Blood Gluc Sensor (FREESTYLE LIBRE 3 SENSOR) MISC 1 application by Does not apply route every 14 (fourteen) days. Place 1 sensor on the skin every 14 days. Use to check glucose continuously   amLODipine (NORVASC) 5 MG tablet Take 1 tablet (5 mg total) by mouth in the morning.   atorvastatin (LIPITOR) 10 MG tablet Take 1 tablet (10 mg total) by mouth daily.   Dulaglutide (TRULICITY) 1.5 MG/0.5ML SOPN ADMINISTER 1.5 MG UNDER THE SKIN 1 TIME A WEEK   glipiZIDE (GLUCOTROL XL) 10 MG 24 hr tablet TAKE 1 TABLET BY MOUTH EVERY MORNING   glucose blood test strip Use as instructed   insulin aspart (FIASP FLEXTOUCH) 100 UNIT/ML FlexTouch Pen Inject 14-20 Units into the skin 3 (three) times daily before meals.   insulin glargine, 2 Unit Dial, (TOUJEO MAX SOLOSTAR) 300 UNIT/ML Solostar Pen Inject 80 Units into the skin at bedtime.   Insulin Pen  Needle (BD PEN NEEDLE NANO 2ND GEN) 32G X 4 MM MISC Use 2x per day as directed.   Lancets (ACCU-CHEK SOFT TOUCH) lancets Use as instructed   lisinopril (ZESTRIL) 10 MG tablet Take 1 tablet (10 mg total) by mouth daily.   metFORMIN (GLUCOPHAGE) 500 MG tablet TAKE 1 TABLET(500 MG) BY MOUTH TWICE DAILY WITH A MEAL   norgestimate-ethinyl estradiol (MILI) 0.25-35 MG-MCG tablet Take 1 tablet by mouth daily.   [DISCONTINUED] insulin aspart (FIASP FLEXTOUCH) 100 UNIT/ML FlexTouch Pen Inject 10-16 Units into the skin 3 (three) times daily before meals.   [DISCONTINUED] insulin glargine, 2 Unit Dial, (TOUJEO MAX SOLOSTAR) 300 UNIT/ML Solostar Pen Inject 70 Units into the skin at bedtime.   [DISCONTINUED] ipratropium (ATROVENT) 0.03 % nasal spray Place 2 sprays into both nostrils every 12 (twelve) hours. (Patient not taking: Reported on 01/01/2021)   [DISCONTINUED] sulfamethoxazole-trimethoprim (BACTRIM DS) 800-160 MG tablet Take 1 tablet by mouth 2 (two) times daily.   No facility-administered  encounter medications on file as of 01/19/2021.    ALLERGIES: Allergies  Allergen Reactions   Bee Venom Swelling    Progressively worse with each sting Other reaction(s): Other   Nickel Rash and Itching   Ibuprofen Hives    VACCINATION STATUS: Immunization History  Administered Date(s) Administered   Tdap 06/26/2006    Diabetes She presents for her follow-up diabetic visit. She has type 2 diabetes mellitus. Onset time: She was diagnosed at approx age of 52. Her disease course has been improving. There are no hypoglycemic associated symptoms. Associated symptoms include fatigue, polydipsia and polyuria. Pertinent negatives for diabetes include no blurred vision and no polyphagia. There are no hypoglycemic complications. Symptoms are stable. There are no diabetic complications. Risk factors for coronary artery disease include diabetes mellitus, dyslipidemia, hypertension, obesity, sedentary lifestyle, family history and stress. Current diabetic treatment includes oral agent (dual therapy) and intensive insulin program (and trulicity). She is compliant with treatment most of the time. Her weight is fluctuating minimally. She is following a generally unhealthy diet. When asked about meal planning, she reported none. She has not had a previous visit with a dietitian. She participates in exercise intermittently. Her home blood glucose trend is decreasing steadily. Her breakfast blood glucose range is generally 180-200 mg/dl. Her lunch blood glucose range is generally >200 mg/dl. Her dinner blood glucose range is generally >200 mg/dl. Her bedtime blood glucose range is generally >200 mg/dl. Her overall blood glucose range is >200 mg/dl. (She presents today with her meter, and logs on her phone, showing gradually improving glycemic profile since starting prandial insulin regimen.  She admits she has forgotten to take her insulin from time to time but is still adjusting to the new routine.  She denies any  hypoglycemia.  She was not due for another A1c today.) An ACE inhibitor/angiotensin II receptor blocker is being taken. She does not see a podiatrist.Eye exam is current.  Hypertension This is a chronic problem. The current episode started more than 1 year ago. The problem has been gradually improving since onset. The problem is controlled. Pertinent negatives include no blurred vision. There are no associated agents to hypertension. Risk factors for coronary artery disease include diabetes mellitus, dyslipidemia, obesity, sedentary lifestyle and stress. Past treatments include calcium channel blockers and ACE inhibitors. The current treatment provides mild improvement. Compliance problems include diet and exercise.   Hyperlipidemia This is a chronic problem. The current episode started more than 1 year ago. The problem is uncontrolled. Recent lipid  tests were reviewed and are high. Exacerbating diseases include diabetes and obesity. Factors aggravating her hyperlipidemia include fatty foods. Current antihyperlipidemic treatment includes statins. The current treatment provides mild improvement of lipids. Compliance problems include adherence to diet and adherence to exercise.  Risk factors for coronary artery disease include diabetes mellitus, dyslipidemia, hypertension, obesity, a sedentary lifestyle and stress.    Review of systems  Constitutional: + Minimally fluctuating body weight,  current Body mass index is 36.64 kg/m. , no fatigue, no subjective hyperthermia, no subjective hypothermia Eyes: no blurry vision, no xerophthalmia ENT: no sore throat, no nodules palpated in throat, no dysphagia/odynophagia, no hoarseness Cardiovascular: no chest pain, no shortness of breath, no palpitations, no leg swelling Respiratory: no cough, no shortness of breath Gastrointestinal: no nausea/vomiting/diarrhea Musculoskeletal: no muscle/joint aches Skin: no rashes, no hyperemia Neurological: no tremors, no  numbness, no tingling, no dizziness Psychiatric: no depression, no anxiety  Objective:     BP 133/87   Pulse 82   Ht 5' 4.5" (1.638 m)   Wt 216 lb 12.8 oz (98.3 kg)   BMI 36.64 kg/m   Wt Readings from Last 3 Encounters:  01/19/21 216 lb 12.8 oz (98.3 kg)  01/01/21 215 lb 6.4 oz (97.7 kg)  12/31/20 215 lb 9.6 oz (97.8 kg)    BP Readings from Last 3 Encounters:  01/19/21 133/87  01/01/21 (!) 132/95  12/31/20 138/88     Physical Exam- Limited  Constitutional:  Body mass index is 36.64 kg/m. , not in acute distress, normal state of mind Eyes:  EOMI, no exophthalmos Neck: Supple Cardiovascular: RRR, no murmurs, rubs, or gallops, no edema Respiratory: Adequate breathing efforts, no crackles, rales, rhonchi, or wheezing Musculoskeletal: no gross deformities, strength intact in all four extremities, no gross restriction of joint movements Skin:  no rashes, no hyperemia Neurological: no tremor with outstretched hands    CMP ( most recent) CMP     Component Value Date/Time   NA 136 12/31/2020 0806   K 3.9 12/31/2020 0806   CL 97 12/31/2020 0806   CO2 22 12/31/2020 0806   GLUCOSE 222 (H) 12/31/2020 0806   GLUCOSE 318 (H) 07/10/2019 0506   BUN 13 12/31/2020 0806   CREATININE 0.63 12/31/2020 0806   CREATININE 0.77 01/14/2016 1514   CALCIUM 9.3 12/31/2020 0806   PROT 6.7 12/31/2020 0806   ALBUMIN 3.9 12/31/2020 0806   AST 19 12/31/2020 0806   ALT 26 12/31/2020 0806   ALKPHOS 82 12/31/2020 0806   BILITOT <0.2 12/31/2020 0806   GFRNONAA 118 01/29/2020 0926   GFRNONAA >89 01/14/2016 1514   GFRAA 136 01/29/2020 0926   GFRAA >89 01/14/2016 1514     Diabetic Labs (most recent): Lab Results  Component Value Date   HGBA1C 11.6 (A) 01/01/2021   HGBA1C 11.3 (A) 09/28/2020   HGBA1C 11.9 (A) 06/23/2020     Lipid Panel ( most recent) Lipid Panel     Component Value Date/Time   CHOL 132 12/31/2020 0806   TRIG 205 (H) 12/31/2020 0806   HDL 35 (L) 12/31/2020 0806    CHOLHDL 3.8 12/31/2020 0806   CHOLHDL 5.1 07/10/2019 0506   VLDL 32 07/10/2019 0506   LDLCALC 63 12/31/2020 0806   LABVLDL 34 12/31/2020 0806      Lab Results  Component Value Date   TSH 3.180 12/31/2020   TSH 2.00 01/14/2016   FREET4 1.23 12/31/2020           Assessment & Plan:   1) Type  2 diabetes mellitus without complication, without long-term current use of insulin (HCC)  - Baljit B Dorcas Melito has currently uncontrolled symptomatic type 2 DM since 30 years of age.   Recent labs reviewed.  She presents today with her meter, and logs on her phone, showing gradually improving glycemic profile since starting prandial insulin regimen.  She admits she has forgotten to take her insulin from time to time but is still adjusting to the new routine.  She denies any hypoglycemia.  She was not due for another A1c today.  Analysis of her CGM shows 7-day average of 247, 14-day average of 260, 30-day average of 258.  - I had a long discussion with her about the progressive nature of diabetes and the pathology behind its complications.  -She does not currently have any complications from diabetes but she remains at a high risk for more acute and chronic complications which include CAD, CVA, CKD, retinopathy, and neuropathy. These are all discussed in detail with her.  - Nutritional counseling repeated at each appointment due to patients tendency to fall back in to old habits.  - The patient admits there is a room for improvement in their diet and drink choices. -  Suggestion is made for the patient to avoid simple carbohydrates from their diet including Cakes, Sweet Desserts / Pastries, Ice Cream, Soda (diet and regular), Sweet Tea, Candies, Chips, Cookies, Sweet Pastries, Store Bought Juices, Alcohol in Excess of 1-2 drinks a day, Artificial Sweeteners, Coffee Creamer, and "Sugar-free" Products. This will help patient to have stable blood glucose profile and potentially avoid unintended  weight gain.   - I encouraged the patient to switch to unprocessed or minimally processed complex starch and increased protein intake (animal or plant source), fruits, and vegetables.   - Patient is advised to stick to a routine mealtimes to eat 3 meals a day and avoid unnecessary snacks (to snack only to correct hypoglycemia).  - she has been scheduled with Norm Salt, RDN, CDE for diabetes education.  - I have approached her with the following individualized plan to manage  her diabetes and patient agrees:   -Based on her fasting hyperglycemia, she is advised to increase her Toujeo to 80 units SQ nightly and increase her Fiasp to 14-20 units TID with meals if glucose is above 90 and she is eating (Specific instructions on how to titrate insulin dosage based on glucose readings given to patient in writing). She can also continue her Metformin 500 mg twice daily with meals, Glipizide 10 mg XL daily with breakfast and Trulicity 1.5 mg SQ weekly.    -she is encouraged to consistently monitor blood glucose 4 times daily, before meals and before bed, and to call the clinic if she has readings less than 70 or above 300 for 3 tests in a row.  She is an excellent candidate for CGM, will order Libre 3 through her local pharmacy (sample of Josephine Igo 3 given to patient today).  - she is warned not to take insulin without proper monitoring per orders. - Adjustment parameters are given to her for hypo and hyperglycemia in writing.  - Specific targets for  A1c;  LDL, HDL,  and Triglycerides were discussed with the patient.  2) Blood Pressure /Hypertension:  Her blood pressure is controlled to target.  She is advised to continue Amlodipine 5 mg po daily, and Lisinopril 10 mg po daily.   3) Lipids/Hyperlipidemia:    Her most recent lipid panel from 12/31/20 shows improved and  controlled LDL of 63 and elevated but improved triglycerides of 205.   She is advised to continue Lipitor 10 mg po daily at bedtime.   Side effects and precautions discussed with her.  She was advised to avoid fried foods and butter.    4)  Weight/Diet:  her Body mass index is 36.64 kg/m.  - clearly complicating her diabetes care.   she is a candidate for weight loss. I discussed with her the fact that loss of 5 - 10% of her  current body weight will have the most impact on her diabetes management.  Exercise, and detailed carbohydrates information provided  -  detailed on discharge instructions.  5) Chronic Care/Health Maintenance: -she is on ACEI/ARB and Statin medications and is encouraged to initiate and continue to follow up with Ophthalmology, Dentist,  Podiatrist at least yearly or according to recommendations, and advised to stay away from smoking. I have recommended yearly flu vaccine and pneumonia vaccine at least every 5 years; moderate intensity exercise for up to 150 minutes weekly; and  sleep for at least 7 hours a day.  - she is  advised to maintain close follow up with Tommie Sams, DO for primary care needs, as well as her other providers for optimal and coordinated care.     I spent 30 minutes in the care of the patient today including review of labs from CMP, Lipids, Thyroid Function, Hematology (current and previous including abstractions from other facilities); face-to-face time discussing  her blood glucose readings/logs, discussing hypoglycemia and hyperglycemia episodes and symptoms, medications doses, her options of short and long term treatment based on the latest standards of care / guidelines;  discussion about incorporating lifestyle medicine;  and documenting the encounter.    Please refer to Patient Instructions for Blood Glucose Monitoring and Insulin/Medications Dosing Guide"  in media tab for additional information. Please  also refer to " Patient Self Inventory" in the Media  tab for reviewed elements of pertinent patient history.  Gladstone Lighter Sams participated in the discussions, expressed  understanding, and voiced agreement with the above plans.  All questions were answered to her satisfaction. she is encouraged to contact clinic should she have any questions or concerns prior to her return visit.  Follow up plan: - Return in about 1 month (around 02/18/2021) for Diabetes F/U, Bring meter and logs, No previsit labs.  [name], [facility] [address] [place], [place] [address] Phone: [phone] Fax: [phone]  01/19/2021, 11:54 AM

## 2021-02-07 ENCOUNTER — Ambulatory Visit
Admission: EM | Admit: 2021-02-07 | Discharge: 2021-02-07 | Disposition: A | Discharge status: Home / Self Care | Payer: 59 | Attending: Family Medicine | Admitting: Family Medicine

## 2021-02-07 ENCOUNTER — Other Ambulatory Visit: Payer: Self-pay

## 2021-02-07 DIAGNOSIS — L02214 Cutaneous abscess of groin: Secondary | ICD-10-CM | POA: Diagnosis not present

## 2021-02-07 MED ORDER — SULFAMETHOXAZOLE-TRIMETHOPRIM 800-160 MG PO TABS: 1.0000 | ORAL_TABLET | Freq: Two times a day (BID) | ORAL | 0 refills | Status: AC

## 2021-02-07 NOTE — ED Triage Notes (Signed)
Patient states she has an abscess in the crease of her thigh on the right side that started hurting yesterday  She states she took Naproxen last night for pain so she could sleep.  Denies Drainage

## 2021-02-07 NOTE — ED Provider Notes (Signed)
[facility]    CSN: [id number] Arrival date & time: 02/07/21  1245      History   Chief Complaint No chief complaint on file.   HPI Melanie Mccoy is a 30 y.o. female.   Presenting today with 1 day history of painful red swollen area in the right groin fold.  She states she took a naproxen and took a hot shower and has noticed significant improvement in the swelling and the pain since then.  She states she has had abscesses in this area in the past and has had to get them drained.  She denies injury to the area, fever, chills, body aches.   Past Medical History:  Diagnosis Date   Anemia    slightly anemic, iron supplements every 2-3 days   Bartholin cyst    Chlamydia 01/2011   Diabetes mellitus without complication (HCC)    Family history of anesthesia complication    grandmother had spinal with nerve damage   No pertinent past medical history     Patient Active Problem List   Diagnosis Date Noted   Abscess, groin 12/31/2020   Essential hypertension 09/18/2019   Type 2 diabetes mellitus without complication, without long-term current use of insulin (HCC) 01/19/2016   Acne vulgaris 04/15/2014   Morbid obesity (HCC) 04/15/2014   PCOS (polycystic ovarian syndrome) 04/15/2014   Status post primary low transverse cesarean section 11/22/2012    Past Surgical History:  Procedure Laterality Date   CESAREAN SECTION N/A 11/22/2012   Procedure: PRIMARY CESAREAN SECTION;  Surgeon: Esmeralda Arthur, MD;  Location: [facility];  Service: Obstetrics;  Laterality: N/A;   CYST REMOVAL TRUNK  2005    OB History     Gravida  2   Para  2   Term      Preterm      AB      Living  1      SAB      IAB      Ectopic      Multiple      Live Births  1            Home Medications    Prior to Admission medications   Medication Sig Start Date End Date Taking? Authorizing Provider  sulfamethoxazole-trimethoprim (BACTRIM DS) 800-160 MG tablet Take 1  tablet by mouth 2 (two) times daily for 7 days. 02/07/21 02/14/21 Yes Particia Nearing, PA-C  amLODipine (NORVASC) 5 MG tablet Take 1 tablet (5 mg total) by mouth in the morning. 01/01/21   Dani Gobble, NP  atorvastatin (LIPITOR) 10 MG tablet Take 1 tablet (10 mg total) by mouth daily. 01/01/21   Dani Gobble, NP  Continuous Blood Gluc Sensor (FREESTYLE LIBRE 3 SENSOR) MISC 1 application by Does not apply route every 14 (fourteen) days. Place 1 sensor on the skin every 14 days. Use to check glucose continuously 01/19/21   Dani Gobble, NP  Dulaglutide (TRULICITY) 1.5 MG/0.5ML SOPN ADMINISTER 1.5 MG UNDER THE SKIN 1 TIME A WEEK 01/01/21   Dani Gobble, NP  glipiZIDE (GLUCOTROL XL) 10 MG 24 hr tablet TAKE 1 TABLET BY MOUTH EVERY MORNING 09/30/20   Laroy Apple M, DO  glucose blood test strip Use as instructed 01/14/19   Campbell Riches, NP  insulin aspart (FIASP FLEXTOUCH) 100 UNIT/ML FlexTouch Pen Inject 14-20 Units into the skin 3 (three) times daily before meals. 01/19/21   Dani Gobble, NP  insulin glargine,  2 Unit Dial, (TOUJEO MAX SOLOSTAR) 300 UNIT/ML Solostar Pen Inject 80 Units into the skin at bedtime. 01/19/21   Dani Gobble, NP  Insulin Pen Needle (BD PEN NEEDLE NANO 2ND GEN) 32G X 4 MM MISC Use 2x per day as directed. 01/29/20   Laroy Apple M, DO  Lancets (ACCU-CHEK SOFT TOUCH) lancets Use as instructed 01/14/19   Campbell Riches, NP  lisinopril (ZESTRIL) 10 MG tablet Take 1 tablet (10 mg total) by mouth daily. 01/01/21   Dani Gobble, NP  metFORMIN (GLUCOPHAGE) 500 MG tablet TAKE 1 TABLET(500 MG) BY MOUTH TWICE DAILY WITH A MEAL 01/01/21   Dani Gobble, NP  norgestimate-ethinyl estradiol (MILI) 0.25-35 MG-MCG tablet Take 1 tablet by mouth daily. 07/09/20   Annalee Genta, DO    Family History Family History  Problem Relation Age of Onset   Cancer Other    Heart disease Other    Diabetes Other    Diabetes Mother     Diabetes Father    Diabetes Maternal Grandfather     Social History Social History   Tobacco Use   Smoking status: Never   Smokeless tobacco: Never  Vaping Use   Vaping Use: Never used  Substance Use Topics   Alcohol use: No    Comment: Occassionally   Drug use: No     Allergies   Bee venom, Nickel, and Ibuprofen   Review of Systems Review of Systems Per HPI  Physical Exam Triage Vital Signs ED Triage Vitals  Enc Vitals Group     BP 02/07/21 1438 (!) 143/94     Pulse Rate 02/07/21 1438 (!) 102     Resp 02/07/21 1438 16     Temp 02/07/21 1438 98.7 F (37.1 C)     Temp Source 02/07/21 1438 Oral     SpO2 02/07/21 1438 98 %     Weight --      Height --      Head Circumference --      Peak Flow --      Pain Score 02/07/21 1434 8     Pain Loc --      Pain Edu? --      Excl. in GC? --    No data found.  Updated Vital Signs BP (!) 143/94 (BP Location: Left Arm)   Pulse (!) 102   Temp 98.7 F (37.1 C) (Oral)   Resp 16   LMP 01/31/2021 (Exact Date)   SpO2 98%   Visual Acuity Right Eye Distance:   Left Eye Distance:   Bilateral Distance:    Right Eye Near:   Left Eye Near:    Bilateral Near:     Physical Exam Vitals and nursing note reviewed. Exam conducted with a chaperone present.  Constitutional:      Appearance: Normal appearance. She is not ill-appearing.  HENT:     Head: Atraumatic.  Eyes:     Extraocular Movements: Extraocular movements intact.     Conjunctiva/sclera: Conjunctivae normal.  Cardiovascular:     Rate and Rhythm: Normal rate and regular rhythm.     Heart sounds: Normal heart sounds.  Pulmonary:     Effort: Pulmonary effort is normal.     Breath sounds: Normal breath sounds.  Genitourinary:    Comments: Pea-sized erythematous fluctuant abscess right groin fold extending onto mons.  Tender to palpation.  No active drainage or bleeding. Musculoskeletal:        General: Normal range of motion.  Cervical back: Normal range  of motion and neck supple.  Skin:    General: Skin is warm and dry.  Neurological:     Mental Status: She is alert and oriented to person, place, and time.  Psychiatric:        Mood and Affect: Mood normal.        Thought Content: Thought content normal.        Judgment: Judgment normal.     UC Treatments / Results  Labs (all labs ordered are listed, but only abnormal results are displayed) Labs Reviewed - No data to display  EKG   Radiology No results found.  Procedures Procedures (including critical care time)  Medications Ordered in UC Medications - No data to display  Initial Impression / Assessment and Plan / UC Course  I have reviewed the triage vital signs and the nursing notes.  Pertinent labs & imaging results that were available during my care of the patient were reviewed by me and considered in my medical decision making (see chart for details).      Given that she is presenting early on in the course and the area is still fairly small, will try to forego I&D and treat with antibiotics, warm compresses, anti-inflammatories.  Discussed outpatient follow-up for recheck and discussion regarding excision of this area given recurrent nature of the abscess.  Return for worsening symptoms at any time.  Final Clinical Impressions(s) / UC Diagnoses   Final diagnoses:  Abscess of groin, right   Discharge Instructions   None    ED Prescriptions     Medication Sig Dispense Auth. Provider   sulfamethoxazole-trimethoprim (BACTRIM DS) 800-160 MG tablet Take 1 tablet by mouth 2 (two) times daily for 7 days. 14 tablet Particia Nearing, [place]      PDMP not reviewed this encounter.   Particia Nearing, [place] 02/07/21 1527

## 2021-02-08 ENCOUNTER — Other Ambulatory Visit: Payer: Self-pay | Admitting: Physician Assistant

## 2021-02-08 ENCOUNTER — Other Ambulatory Visit: Payer: Self-pay | Admitting: Family Medicine

## 2021-02-08 DIAGNOSIS — E119 Type 2 diabetes mellitus without complications: Secondary | ICD-10-CM

## 2021-02-08 DIAGNOSIS — J069 Acute upper respiratory infection, unspecified: Secondary | ICD-10-CM

## 2021-02-16 ENCOUNTER — Encounter: Payer: Self-pay | Admitting: Nutrition

## 2021-02-16 ENCOUNTER — Other Ambulatory Visit: Payer: Self-pay

## 2021-02-16 ENCOUNTER — Ambulatory Visit (INDEPENDENT_AMBULATORY_CARE_PROVIDER_SITE_OTHER): Payer: 59 | Admitting: Nurse Practitioner

## 2021-02-16 ENCOUNTER — Encounter: Payer: Self-pay | Admitting: Nurse Practitioner

## 2021-02-16 ENCOUNTER — Encounter: Payer: 59 | Attending: Nurse Practitioner | Admitting: Nutrition

## 2021-02-16 ENCOUNTER — Ambulatory Visit: Payer: 59 | Admitting: Nurse Practitioner

## 2021-02-16 VITALS — BP 129/92 | HR 71 | Ht 64.5 in | Wt 220.4 lb

## 2021-02-16 DIAGNOSIS — E119 Type 2 diabetes mellitus without complications: Secondary | ICD-10-CM

## 2021-02-16 DIAGNOSIS — I10 Essential (primary) hypertension: Secondary | ICD-10-CM | POA: Diagnosis not present

## 2021-02-16 DIAGNOSIS — E1169 Type 2 diabetes mellitus with other specified complication: Secondary | ICD-10-CM | POA: Insufficient documentation

## 2021-02-16 DIAGNOSIS — E785 Hyperlipidemia, unspecified: Secondary | ICD-10-CM

## 2021-02-16 MED ORDER — FIASP FLEXTOUCH 100 UNIT/ML ~~LOC~~ SOPN: 18.0000 [IU] | PEN_INJECTOR | Freq: Three times a day (TID) | SUBCUTANEOUS | 3 refills | Status: DC

## 2021-02-16 MED ORDER — TOUJEO MAX SOLOSTAR 300 UNIT/ML ~~LOC~~ SOPN: 100.0000 [IU] | PEN_INJECTOR | Freq: Every evening | SUBCUTANEOUS | 3 refills | Status: DC

## 2021-02-16 NOTE — Patient Instructions (Signed)

## 2021-02-16 NOTE — Progress Notes (Signed)
Endocrinology Follow Up Visit       02/16/2021, 1:24 PM   Subjective:    Patient ID: Melanie Mccoy, female    DOB: Aug 08, 1990.  Melanie Mccoy is being seen in follow up after being seen in consultation for management of currently uncontrolled symptomatic diabetes requested by  Tommie Sams, DO.   Past Medical History:  Diagnosis Date   Anemia    slightly anemic, iron supplements every 2-3 days   Bartholin cyst    Chlamydia 01/2011   Diabetes mellitus without complication (HCC)    Family history of anesthesia complication    grandmother had spinal with nerve damage   No pertinent past medical history     Past Surgical History:  Procedure Laterality Date   CESAREAN SECTION N/A 11/22/2012   Procedure: PRIMARY CESAREAN SECTION;  Surgeon: Esmeralda Arthur, MD;  Location: [facility];  Service: Obstetrics;  Laterality: N/A;   CYST REMOVAL TRUNK  2005    Social History   Socioeconomic History   Marital status: Single    Spouse name: Not on file   Number of children: Not on file   Years of education: Not on file   Highest education level: Not on file  Occupational History   Not on file  Tobacco Use   Smoking status: Never   Smokeless tobacco: Never  Vaping Use   Vaping Use: Never used  Substance and Sexual Activity   Alcohol use: No    Comment: Occassionally   Drug use: No   Sexual activity: Yes  Other Topics Concern   Not on file  Social History Narrative   Not on file   Social Determinants of Health   Financial Resource Strain: Not on file  Food Insecurity: Not on file  Transportation Needs: Not on file  Physical Activity: Not on file  Stress: Not on file  Social Connections: Not on file    Family History  Problem Relation Age of Onset   Cancer Other    Heart disease Other    Diabetes Other    Diabetes Mother    Diabetes Father    Diabetes Maternal Grandfather     Outpatient  Encounter Medications as of 02/16/2021  Medication Sig   amLODipine (NORVASC) 5 MG tablet Take 1 tablet (5 mg total) by mouth in the morning.   atorvastatin (LIPITOR) 10 MG tablet Take 1 tablet (10 mg total) by mouth daily.   Continuous Blood Gluc Sensor (FREESTYLE LIBRE 3 SENSOR) MISC 1 application by Does not apply route every 14 (fourteen) days. Place 1 sensor on the skin every 14 days. Use to check glucose continuously   Dulaglutide (TRULICITY) 1.5 MG/0.5ML SOPN ADMINISTER 1.5 MG UNDER THE SKIN 1 TIME A WEEK   glipiZIDE (GLUCOTROL XL) 10 MG 24 hr tablet TAKE 1 TABLET BY MOUTH EVERY MORNING   glucose blood test strip Use as instructed   Insulin Pen Needle (BD PEN NEEDLE NANO 2ND GEN) 32G X 4 MM MISC USE TWICE DAILY   Lancets (ACCU-CHEK SOFT TOUCH) lancets Use as instructed   lisinopril (ZESTRIL) 10 MG tablet Take 1 tablet (10 mg total) by mouth daily.   metFORMIN (GLUCOPHAGE) 500  MG tablet TAKE 1 TABLET(500 MG) BY MOUTH TWICE DAILY WITH A MEAL   norgestimate-ethinyl estradiol (MILI) 0.25-35 MG-MCG tablet Take 1 tablet by mouth daily.   [DISCONTINUED] insulin aspart (FIASP FLEXTOUCH) 100 UNIT/ML FlexTouch Pen Inject 14-20 Units into the skin 3 (three) times daily before meals.   [DISCONTINUED] insulin glargine, 2 Unit Dial, (TOUJEO MAX SOLOSTAR) 300 UNIT/ML Solostar Pen Inject 80 Units into the skin at bedtime.   insulin aspart (FIASP FLEXTOUCH) 100 UNIT/ML FlexTouch Pen Inject 18-24 Units into the skin 3 (three) times daily before meals.   insulin glargine, 2 Unit Dial, (TOUJEO MAX SOLOSTAR) 300 UNIT/ML Solostar Pen Inject 100 Units into the skin at bedtime.   No facility-administered encounter medications on file as of 02/16/2021.    ALLERGIES: Allergies  Allergen Reactions   Bee Venom Swelling    Progressively worse with each sting Other reaction(s): Other   Nickel Rash and Itching   Ibuprofen Hives    VACCINATION STATUS: Immunization History  Administered Date(s) Administered    Tdap 06/26/2006    Diabetes She presents for her follow-up diabetic visit. She has type 2 diabetes mellitus. Onset time: She was diagnosed at approx age of 7. Her disease course has been stable. There are no hypoglycemic associated symptoms. Associated symptoms include fatigue, polydipsia and polyuria. Pertinent negatives for diabetes include no blurred vision and no polyphagia. There are no hypoglycemic complications. Symptoms are stable. There are no diabetic complications. Risk factors for coronary artery disease include diabetes mellitus, dyslipidemia, hypertension, obesity, sedentary lifestyle, family history and stress. Current diabetic treatment includes oral agent (dual therapy) and intensive insulin program (and trulicity). She is compliant with treatment most of the time. Her weight is increasing steadily. She is following a generally unhealthy diet. When asked about meal planning, she reported none. She has not had a previous visit with a dietitian. She participates in exercise intermittently. Her home blood glucose trend is fluctuating dramatically. Her breakfast blood glucose range is generally >200 mg/dl. Her lunch blood glucose range is generally >200 mg/dl. Her dinner blood glucose range is generally >200 mg/dl. Her bedtime blood glucose range is generally >200 mg/dl. Her overall blood glucose range is >200 mg/dl. (She presents today with her CGM, no logs, showing widely fluctuating glycemic profile with predominantly high readings.  She was not due for another A1c today.  Analysis of her CGM shows TIR 6%, TAR 94%, TBR 0%.  She continues to work on diet and exercise.  She loves the [place] 3 and is seeing how foods react to her blood glucose.) An ACE inhibitor/angiotensin II receptor blocker is being taken. She does not see a podiatrist.Eye exam is current.  Hypertension This is a chronic problem. The current episode started more than 1 year ago. The problem has been gradually improving since  onset. The problem is controlled. Pertinent negatives include no blurred vision. There are no associated agents to hypertension. Risk factors for coronary artery disease include diabetes mellitus, dyslipidemia, obesity, sedentary lifestyle and stress. Past treatments include calcium channel blockers and ACE inhibitors. The current treatment provides mild improvement. Compliance problems include diet and exercise.   Hyperlipidemia This is a chronic problem. The current episode started more than 1 year ago. The problem is uncontrolled. Recent lipid tests were reviewed and are high. Exacerbating diseases include diabetes and obesity. Factors aggravating her hyperlipidemia include fatty foods. Current antihyperlipidemic treatment includes statins. The current treatment provides mild improvement of lipids. Compliance problems include adherence to diet and adherence to exercise.  Risk factors for coronary artery disease include diabetes mellitus, dyslipidemia, hypertension, obesity, a sedentary lifestyle and stress.    Review of systems  Constitutional: +steadily increasing body weight,  current Body mass index is 37.25 kg/m. , no fatigue, no subjective hyperthermia, no subjective hypothermia Eyes: no blurry vision, no xerophthalmia ENT: no sore throat, no nodules palpated in throat, no dysphagia/odynophagia, no hoarseness Cardiovascular: no chest pain, no shortness of breath, no palpitations, no leg swelling Respiratory: no cough, no shortness of breath Gastrointestinal: no nausea/vomiting/diarrhea Musculoskeletal: no muscle/joint aches Skin: no rashes, no hyperemia Neurological: no tremors, no numbness, no tingling, no dizziness Psychiatric: no depression, no anxiety  Objective:     BP (!) 129/92    Pulse 71    Ht 5' 4.5" (1.638 m)    Wt 220 lb 6.4 oz (100 kg)    LMP 01/31/2021 (Exact Date)    BMI 37.25 kg/m   Wt Readings from Last 3 Encounters:  02/16/21 220 lb 6.4 oz (100 kg)  01/19/21 216 lb  12.8 oz (98.3 kg)  01/01/21 215 lb 6.4 oz (97.7 kg)    BP Readings from Last 3 Encounters:  02/16/21 (!) 129/92  02/07/21 (!) 143/94  01/19/21 133/87    Physical Exam- Limited  Constitutional:  Body mass index is 37.25 kg/m. , not in acute distress, normal state of mind Eyes:  EOMI, no exophthalmos Neck: Supple Cardiovascular: RRR, no murmurs, rubs, or gallops, no edema Respiratory: Adequate breathing efforts, no crackles, rales, rhonchi, or wheezing Musculoskeletal: no gross deformities, strength intact in all four extremities, no gross restriction of joint movements Skin:  no rashes, no hyperemia Neurological: no tremor with outstretched hands    CMP ( most recent) CMP     Component Value Date/Time   NA 136 12/31/2020 0806   K 3.9 12/31/2020 0806   CL 97 12/31/2020 0806   CO2 22 12/31/2020 0806   GLUCOSE 222 (H) 12/31/2020 0806   GLUCOSE 318 (H) 07/10/2019 0506   BUN 13 12/31/2020 0806   CREATININE 0.63 12/31/2020 0806   CREATININE 0.77 01/14/2016 1514   CALCIUM 9.3 12/31/2020 0806   PROT 6.7 12/31/2020 0806   ALBUMIN 3.9 12/31/2020 0806   AST 19 12/31/2020 0806   ALT 26 12/31/2020 0806   ALKPHOS 82 12/31/2020 0806   BILITOT <0.2 12/31/2020 0806   GFRNONAA 118 01/29/2020 0926   GFRNONAA >89 01/14/2016 1514   GFRAA 136 01/29/2020 0926   GFRAA >89 01/14/2016 1514     Diabetic Labs (most recent): Lab Results  Component Value Date   HGBA1C 11.6 (A) 01/01/2021   HGBA1C 11.3 (A) 09/28/2020   HGBA1C 11.9 (A) 06/23/2020     Lipid Panel ( most recent) Lipid Panel     Component Value Date/Time   CHOL 132 12/31/2020 0806   TRIG 205 (H) 12/31/2020 0806   HDL 35 (L) 12/31/2020 0806   CHOLHDL 3.8 12/31/2020 0806   CHOLHDL 5.1 07/10/2019 0506   VLDL 32 07/10/2019 0506   LDLCALC 63 12/31/2020 0806   LABVLDL 34 12/31/2020 0806      Lab Results  Component Value Date   TSH 3.180 12/31/2020   TSH 2.00 01/14/2016   FREET4 1.23 12/31/2020            Assessment & Plan:   1) Type 2 diabetes mellitus without complication, without long-term current use of insulin (HCC)  - Sudie B Shabree Tebbetts has currently uncontrolled symptomatic type 2 DM since 30 years of age.  Recent labs reviewed.  She presents today with her CGM, no logs, showing widely fluctuating glycemic profile with predominantly high readings.  She was not due for another A1c today.  Analysis of her CGM shows TIR 6%, TAR 94%, TBR 0%.  She continues to work on diet and exercise.  She loves the [place] 3 and is seeing how foods react to her blood glucose.  - I had a long discussion with her about the progressive nature of diabetes and the pathology behind its complications.  -She does not currently have any complications from diabetes but she remains at a high risk for more acute and chronic complications which include CAD, CVA, CKD, retinopathy, and neuropathy. These are all discussed in detail with her.  - Nutritional counseling repeated at each appointment due to patients tendency to fall back in to old habits.  - The patient admits there is a room for improvement in their diet and drink choices. -  Suggestion is made for the patient to avoid simple carbohydrates from their diet including Cakes, Sweet Desserts / Pastries, Ice Cream, Soda (diet and regular), Sweet Tea, Candies, Chips, Cookies, Sweet Pastries, Store Bought Juices, Alcohol in Excess of 1-2 drinks a day, Artificial Sweeteners, Coffee Creamer, and "Sugar-free" Products. This will help patient to have stable blood glucose profile and potentially avoid unintended weight gain.   - I encouraged the patient to switch to unprocessed or minimally processed complex starch and increased protein intake (animal or plant source), fruits, and vegetables.   - Patient is advised to stick to a routine mealtimes to eat 3 meals a day and avoid unnecessary snacks (to snack only to correct hypoglycemia).  - she has been scheduled  with Norm Salt, RDN, CDE for diabetes education.  - I have approached her with the following individualized plan to manage  her diabetes and patient agrees:   -Based on her fasting hyperglycemia, she is advised to increase her Toujeo to 100 units SQ nightly and increase her Fiasp to 18-24 units TID with meals if glucose is above 90 and she is eating (Specific instructions on how to titrate insulin dosage based on glucose readings given to patient in writing). She can also continue her Metformin 500 mg twice daily with meals, Glipizide 10 mg XL daily with breakfast and Trulicity 1.5 mg SQ weekly.    -she is encouraged to consistently monitor blood glucose 4 times daily (using her CGM), and to call the clinic if she has readings less than 70 or above 300 for 3 tests in a row.  - she is warned not to take insulin without proper monitoring per orders. - Adjustment parameters are given to her for hypo and hyperglycemia in writing.  - Specific targets for  A1c;  LDL, HDL,  and Triglycerides were discussed with the patient.  2) Blood Pressure /Hypertension:  Her blood pressure is controlled to target.  She is advised to continue Amlodipine 5 mg po daily, and Lisinopril 10 mg po daily.   3) Lipids/Hyperlipidemia:    Her most recent lipid panel from 12/31/20 shows improved and controlled LDL of 63 and elevated but improved triglycerides of 205.   She is advised to continue Lipitor 10 mg po daily at bedtime.  Side effects and precautions discussed with her.  She was advised to avoid fried foods and butter.    4)  Weight/Diet:  her Body mass index is 37.25 kg/m.  - clearly complicating her diabetes care.   she is a candidate  for weight loss. I discussed with her the fact that loss of 5 - 10% of her  current body weight will have the most impact on her diabetes management.  Exercise, and detailed carbohydrates information provided  -  detailed on discharge instructions.  5) Chronic Care/Health  Maintenance: -she is on ACEI/ARB and Statin medications and is encouraged to initiate and continue to follow up with Ophthalmology, Dentist,  Podiatrist at least yearly or according to recommendations, and advised to stay away from smoking. I have recommended yearly flu vaccine and pneumonia vaccine at least every 5 years; moderate intensity exercise for up to 150 minutes weekly; and  sleep for at least 7 hours a day.  - she is  advised to maintain close follow up with Tommie Sams, DO for primary care needs, as well as her other providers for optimal and coordinated care.     I spent 30 minutes in the care of the patient today including review of labs from CMP, Lipids, Thyroid Function, Hematology (current and previous including abstractions from other facilities); face-to-face time discussing  her blood glucose readings/logs, discussing hypoglycemia and hyperglycemia episodes and symptoms, medications doses, her options of short and long term treatment based on the latest standards of care / guidelines;  discussion about incorporating lifestyle medicine;  and documenting the encounter.    Please refer to Patient Instructions for Blood Glucose Monitoring and Insulin/Medications Dosing Guide"  in media tab for additional information. Please  also refer to " Patient Self Inventory" in the Media  tab for reviewed elements of pertinent patient history.  Gladstone Lighter Sams participated in the discussions, expressed understanding, and voiced agreement with the above plans.  All questions were answered to her satisfaction. she is encouraged to contact clinic should she have any questions or concerns prior to her return visit.  Follow up plan: - Return in about 1 month (around 03/19/2021) for Diabetes F/U, Bring meter and logs.  [name], [facility] [address] [place], [place] [address] Phone: [phone] Fax: [phone]  02/16/2021, 1:24 PM

## 2021-02-16 NOTE — Patient Instructions (Addendum)
Focus on more lower carb vegetables with lunch and dinner. Drink a gallon of water per day If hungry between meals, veggies or protein only. NO alcohol. Use the sliding scale for meal time insulin properly. Use the libre to monitor BS and to be more aware of BS and what effects the spikes. Cut out snacks and eating after 7 pm. Eat 30 g CHO at each meal. Don't skip meals. Eat meals on time. Exercise with walking when you can. Get A1C down to 7%  Keep a food and BS log for 1 week and call me with results

## 2021-02-16 NOTE — Progress Notes (Signed)
Medical Nutrition Therapy  Follow up 1345 1415  Dx 5 yrs  Ago. Sees  Melanie Reardron,FNP At [facility] Endocrinology NUTRITION ASSESSMENT   Has changed to eating to eating later and eating later at night. Feels like her BS are lowering in am. Has the [place]. Has  been able to see her BS more regularly now. She can now see what effect foods have on her blood sugars. Has cut back to only doing 2 jobs. Doesn't like her new job and it is very stressful also. Still working on making better food choices. Stays sluggish and hungry a lot at night. Eats later in am and then eats later at night, effecting her BS. Saw Melanie today. Increased her Toujeo to 100 and her bolus to 18 units with meals plus SS.  Metformin 500 mg BID. Higher doses  Diet and medication compliance could improve to help her BS control. She notes she will try to do better with eating more lower carb vegetables. But she doesn't like a lot of them.  Reviewed her Josephine Igo results with her. TIR is only 6%, 94% are elevated. No low blood sugars.   Lab Results  Component Value Date   HGBA1C 11.6 (A) 01/01/2021   Has been testing 4 times per day. Has been working on eating meals on time.  Referral diagnosis: e11.8, e78.0 Preferred learning style:  no preference indicated Learning readiness: change in progress  Anthropometrics  Wt Readings from Last 3 Encounters:  02/16/21 220 lb 6.4 oz (100 kg)  01/19/21 216 lb 12.8 oz (98.3 kg)  01/01/21 215 lb 6.4 oz (97.7 kg)   Ht Readings from Last 3 Encounters:  02/16/21 5' 4.5" (1.638 m)  01/19/21 5' 4.5" (1.638 m)  01/01/21 5' 4.5" (1.638 m)   There is no height or weight on file to calculate BMI. @BMIFA @ Facility age limit for growth percentiles is 20 years. Facility age limit for growth percentiles is 20 years. Clinical Medical Hx: HTN and Hyperlipidemai Medications: Toujeo 100, Humalog 18 units with meals, Trulicity weekly, metformin 500 BID and Glipizide 10 mg Labs: FBS:  200-300's.  Lab Results  Component Value Date   HGBA1C 11.6 (A) 01/01/2021   CMP Latest Ref Rng & Units 12/31/2020 09/25/2020 01/29/2020  Glucose 70 - 99 mg/dL 01/31/2020) 694(W) 546(E)  BUN 6 - 20 mg/dL 13 14 8   Creatinine 0.57 - 1.00 mg/dL 703(J 0.09  Sodium 134 - 144 mmol/L 136 135 139  Potassium 3.5 - 5.2 mmol/L 3.9 4.5 3.8  Chloride 96 - 106 mmol/L 97 97 99  CO2 20 - 29 mmol/L 22 21 22   Calcium 8.7 - 10.2 mg/dL 9.3 9.5 9.4  Total Protein 6.0 - 8.5 g/dL 6.7 6.7 7.0  Total Bilirubin 0.0 - 1.2 mg/dL 3.81 0.4 0.2  Alkaline Phos 44 - 121 IU/L 82 73 77  AST 0 - 40 IU/L 19 26 25   ALT 0 - 32 IU/L 26 26 49(H)   Lipid Panel     Component Value Date/Time   CHOL 132 12/31/2020 0806   TRIG 205 (H) 12/31/2020 0806   HDL 35 (L) 12/31/2020 0806   CHOLHDL 3.8 12/31/2020 0806   CHOLHDL 5.1 07/10/2019 0506   VLDL 32 07/10/2019 0506   LDLCALC 63 12/31/2020 0806   LABVLDL 34 12/31/2020 0806   Notable Signs/Symptoms: Fatigue, increased thirst, frequent urination, blurry vision  Lifestyle & Dietary Hx She works 1 job now and stress is getting better.  Estimated daily fluid intake: 100 oz Supplements:  none Sleep: 4-7 hrs  Stress / self-care: jobs are stressing Current average weekly physical activity: 2 hours    Estimated Energy Needs Calories: 1200-1400 Carbohydrate: 135g Protein: 90g Fat: 33g   NUTRITION DIAGNOSIS  NB-1.1 Food and nutrition-related knowledge deficit As related to Diabetes Type 2.  As evidenced by A1C 10.4%.  NUTRITION INTERVENTION  Meal planning, prevention of complications of DM. How insulin works and benefits of short acting insulin.   Reviewed emotional eating, need to focus on more nutrient dense food choices and not skipping meals to lose weight.   B)  this am rice cauliflower with brocolli and carrrots with water part of protein shake Lunch: Chicken broccoli and alfredo, meatballs BBQ, Healthy choice meal, water Dinner: Christmas dinner Lots of pasta  ; no veggies,  water    Handouts Provided Include  Emotional eating  Learning Style & Readiness for Change Teaching method utilized: Visual & Auditory  Demonstrated degree of understanding via: Teach Back  Barriers to learning/adherence to lifestyle change: None  Goals Established by Pt Focus on more lower carb vegetables with lunch and dinner. Drink a gallon of water per day If hungry between meals, veggies or protein only. NO alcohol. Use the sliding scale for meal time insulin properly. Use the libre to monitor BS and to be more aware of BS and what effects the spikes. Cut out snacks and eating after 7 pm. Eat 30 g CHO at each meal. Don't skip meals. Eat meals on time. Exercise with walking when you can. Get A1C down to 7%   Keep a food and BS log for 1 week and call me with results    MONITORING & EVALUATION Dietary intake, weekly physical activity, and blood sugars  in 1 month.  Keep a food and BS log for 1 week and call me with results Next Steps  Will continue to work on meal planning.

## 2021-02-19 ENCOUNTER — Ambulatory Visit: Payer: 59 | Admitting: Nurse Practitioner

## 2021-02-24 ENCOUNTER — Telehealth: Payer: Self-pay | Admitting: Nutrition

## 2021-02-24 NOTE — Telephone Encounter (Signed)
TC from patient to review the last week blood sugars. For the last week:  FBS ranges 120-201 mg/dl.      BS before -159-292 mg/sl   Before supper 151- 271 mg/dl             Bedtime:  128-786 mg/sl Taking her insulin with every meals. Trulicity weekly, Toujeo 100 units at night. Drinking more water-5-16 oz Still working 2 jobs. Changed: BS are slightly better. Less tired. Eating more lower carb sides.

## 2021-03-03 ENCOUNTER — Telehealth: Payer: Self-pay | Admitting: Nurse Practitioner

## 2021-03-03 MED ORDER — TOUJEO MAX SOLOSTAR 300 UNIT/ML ~~LOC~~ SOPN: 100.0000 [IU] | PEN_INJECTOR | Freq: Every evening | SUBCUTANEOUS | 1 refills | Status: DC

## 2021-03-03 NOTE — Telephone Encounter (Signed)
Rx Sent  

## 2021-03-03 NOTE — Telephone Encounter (Signed)
Patient left a VM stating that her long lasting insulin was increased but a new prescription was not sent in. Can you send that in to [facility]. Thank you

## 2021-03-03 NOTE — Telephone Encounter (Signed)
Error-please send to [facility] on [address]

## 2021-03-09 ENCOUNTER — Other Ambulatory Visit: Payer: Self-pay

## 2021-03-09 DIAGNOSIS — E119 Type 2 diabetes mellitus without complications: Secondary | ICD-10-CM

## 2021-03-09 MED ORDER — BD PEN NEEDLE NANO 2ND GEN 32G X 4 MM MISC: 1 refills | Status: DC

## 2021-03-09 MED ORDER — FIASP FLEXTOUCH 100 UNIT/ML ~~LOC~~ SOPN: 18.0000 [IU] | PEN_INJECTOR | Freq: Three times a day (TID) | SUBCUTANEOUS | 3 refills | Status: DC

## 2021-03-16 ENCOUNTER — Other Ambulatory Visit: Payer: Self-pay | Admitting: Family Medicine

## 2021-03-16 DIAGNOSIS — E119 Type 2 diabetes mellitus without complications: Secondary | ICD-10-CM

## 2021-03-17 ENCOUNTER — Other Ambulatory Visit: Payer: Self-pay | Admitting: Nurse Practitioner

## 2021-03-17 DIAGNOSIS — E119 Type 2 diabetes mellitus without complications: Secondary | ICD-10-CM

## 2021-03-17 DIAGNOSIS — E785 Hyperlipidemia, unspecified: Secondary | ICD-10-CM

## 2021-03-17 DIAGNOSIS — E1169 Type 2 diabetes mellitus with other specified complication: Secondary | ICD-10-CM

## 2021-03-18 ENCOUNTER — Other Ambulatory Visit: Payer: Self-pay

## 2021-03-18 DIAGNOSIS — E119 Type 2 diabetes mellitus without complications: Secondary | ICD-10-CM

## 2021-03-19 ENCOUNTER — Other Ambulatory Visit: Payer: Self-pay | Admitting: Family Medicine

## 2021-03-19 MED ORDER — FLUCONAZOLE 150 MG PO TABS: 300.0000 mg | ORAL_TABLET | ORAL | 0 refills | Status: DC

## 2021-03-22 NOTE — Patient Instructions (Signed)

## 2021-03-23 ENCOUNTER — Encounter: Payer: 59 | Attending: Nurse Practitioner | Admitting: Nutrition

## 2021-03-23 ENCOUNTER — Encounter: Payer: Self-pay | Admitting: Nurse Practitioner

## 2021-03-23 ENCOUNTER — Ambulatory Visit: Payer: 59 | Admitting: Nurse Practitioner

## 2021-03-23 ENCOUNTER — Other Ambulatory Visit: Payer: Self-pay

## 2021-03-23 VITALS — Ht 63.0 in | Wt 224.4 lb

## 2021-03-23 VITALS — BP 124/89 | HR 90 | Ht 64.5 in | Wt 224.4 lb

## 2021-03-23 DIAGNOSIS — I10 Essential (primary) hypertension: Secondary | ICD-10-CM

## 2021-03-23 DIAGNOSIS — E1169 Type 2 diabetes mellitus with other specified complication: Secondary | ICD-10-CM | POA: Insufficient documentation

## 2021-03-23 DIAGNOSIS — E119 Type 2 diabetes mellitus without complications: Secondary | ICD-10-CM | POA: Insufficient documentation

## 2021-03-23 DIAGNOSIS — E282 Polycystic ovarian syndrome: Secondary | ICD-10-CM | POA: Insufficient documentation

## 2021-03-23 DIAGNOSIS — E785 Hyperlipidemia, unspecified: Secondary | ICD-10-CM

## 2021-03-23 DIAGNOSIS — Z794 Long term (current) use of insulin: Secondary | ICD-10-CM | POA: Diagnosis not present

## 2021-03-23 MED ORDER — BD PEN NEEDLE NANO 2ND GEN 32G X 4 MM MISC: 3 refills | Status: DC

## 2021-03-23 MED ORDER — TRULICITY 4.5 MG/0.5ML ~~LOC~~ SOAJ: 4.5000 mg | SUBCUTANEOUS | 3 refills | Status: DC

## 2021-03-23 NOTE — Progress Notes (Signed)
Endocrinology Follow Up Visit       03/23/2021, 3:30 PM   Subjective:    Patient ID: Melanie Mccoy, female    DOB: May 20, 1990.  Melanie Mccoy is being seen in follow up after being seen in consultation for management of currently uncontrolled symptomatic diabetes requested by  Coral Spikes, DO.   Past Medical History:  Diagnosis Date   Anemia    slightly anemic, iron supplements every 2-3 days   Bartholin cyst    Chlamydia 01/2011   Diabetes mellitus without complication (HCC)    Family history of anesthesia complication    grandmother had spinal with nerve damage   No pertinent past medical history     Past Surgical History:  Procedure Laterality Date   CESAREAN SECTION N/A 11/22/2012   Procedure: PRIMARY CESAREAN SECTION;  Surgeon: Alwyn Pea, MD;  Location: [facility];  Service: Obstetrics;  Laterality: N/A;   CYST REMOVAL TRUNK  2005    Social History   Socioeconomic History   Marital status: Single    Spouse name: Not on file   Number of children: Not on file   Years of education: Not on file   Highest education level: Not on file  Occupational History   Not on file  Tobacco Use   Smoking status: Never   Smokeless tobacco: Never  Vaping Use   Vaping Use: Never used  Substance and Sexual Activity   Alcohol use: No    Comment: Occassionally   Drug use: No   Sexual activity: Yes  Other Topics Concern   Not on file  Social History Narrative   Not on file   Social Determinants of Health   Financial Resource Strain: Not on file  Food Insecurity: Not on file  Transportation Needs: Not on file  Physical Activity: Not on file  Stress: Not on file  Social Connections: Not on file    Family History  Problem Relation Age of Onset   Cancer Other    Heart disease Other    Diabetes Other    Diabetes Mother    Diabetes Father    Diabetes Maternal Grandfather     Outpatient  Encounter Medications as of 03/23/2021  Medication Sig   amLODipine (NORVASC) 5 MG tablet TAKE 1 TABLET(5 MG) BY MOUTH IN THE MORNING   atorvastatin (LIPITOR) 10 MG tablet TAKE 1 TABLET(10 MG) BY MOUTH DAILY   Continuous Blood Gluc Sensor (FREESTYLE LIBRE 3 SENSOR) MISC 1 application by Does not apply route every 14 (fourteen) days. Place 1 sensor on the skin every 14 days. Use to check glucose continuously   Dulaglutide (TRULICITY) 4.5 0000000 SOPN Inject 4.5 mg as directed once a week.   fluconazole (DIFLUCAN) 150 MG tablet Take 2 tablets (300 mg total) by mouth once a week. For 2 weeks.   glipiZIDE (GLUCOTROL XL) 10 MG 24 hr tablet TAKE 1 TABLET BY MOUTH EVERY MORNING   glucose blood test strip Use as instructed   insulin aspart (FIASP FLEXTOUCH) 100 UNIT/ML FlexTouch Pen Inject 18-24 Units into the skin 3 (three) times daily before meals.   insulin glargine, 2 Unit Dial, (TOUJEO MAX SOLOSTAR) 300 UNIT/ML Solostar  Pen Inject 100 Units into the skin at bedtime.   Lancets (ACCU-CHEK SOFT TOUCH) lancets Use as instructed   lisinopril (ZESTRIL) 10 MG tablet Take 1 tablet (10 mg total) by mouth daily.   metFORMIN (GLUCOPHAGE) 500 MG tablet TAKE 1 TABLET(500 MG) BY MOUTH TWICE DAILY WITH A MEAL   norgestimate-ethinyl estradiol (MILI) 0.25-35 MG-MCG tablet Take 1 tablet by mouth daily.   [DISCONTINUED] Dulaglutide (TRULICITY) 1.5 0000000 SOPN ADMINISTER 1.5 MG UNDER THE SKIN 1 TIME A WEEK   [DISCONTINUED] Insulin Pen Needle (BD PEN NEEDLE NANO 2ND GEN) 32G X 4 MM MISC Use to inject insulin 4 times daily.   Insulin Pen Needle (BD PEN NEEDLE NANO 2ND GEN) 32G X 4 MM MISC Use to inject insulin 4 times daily.   No facility-administered encounter medications on file as of 03/23/2021.    ALLERGIES: Allergies  Allergen Reactions   Bee Venom Swelling    Progressively worse with each sting Other reaction(s): Other   Nickel Rash and Itching   Ibuprofen Hives    VACCINATION STATUS: Immunization  History  Administered Date(s) Administered   Tdap 06/26/2006    Diabetes She presents for her follow-up diabetic visit. She has type 2 diabetes mellitus. Onset time: She was diagnosed at approx age of 56. Her disease course has been improving. There are no hypoglycemic associated symptoms. Associated symptoms include fatigue. Pertinent negatives for diabetes include no blurred vision, no polydipsia, no polyphagia and no polyuria. There are no hypoglycemic complications. Symptoms are stable. There are no diabetic complications. Risk factors for coronary artery disease include diabetes mellitus, dyslipidemia, hypertension, obesity, sedentary lifestyle, family history and stress. Current diabetic treatment includes oral agent (dual therapy) and intensive insulin program (and trulicity). She is compliant with treatment most of the time. Her weight is increasing steadily. She is following a generally unhealthy diet. When asked about meal planning, she reported none. She has had a previous visit with a dietitian. She participates in exercise intermittently. Her home blood glucose trend is decreasing steadily. Her breakfast blood glucose range is generally 140-180 mg/dl. Her lunch blood glucose range is generally 140-180 mg/dl. Her dinner blood glucose range is generally >200 mg/dl. Her bedtime blood glucose range is generally >200 mg/dl. Her overall blood glucose range is >200 mg/dl. (She presents today with her CGM, no logs, showing improving glycemic profile with above target fasting and postprandial readings.  She ws not due for another A1c today.  Analysis of her CGM shows TIR 29%, TAR 71%, TBR 0% with a GMI of 8.7%.  She is seeing improvements in her readings now that she has CGM and knows what foods cause significant elevation.) An ACE inhibitor/angiotensin II receptor blocker is being taken. She does not see a podiatrist.Eye exam is current.  Hypertension This is a chronic problem. The current episode  started more than 1 year ago. The problem has been gradually improving since onset. The problem is controlled. Pertinent negatives include no blurred vision. There are no associated agents to hypertension. Risk factors for coronary artery disease include diabetes mellitus, dyslipidemia, obesity, sedentary lifestyle and stress. Past treatments include calcium channel blockers and ACE inhibitors. The current treatment provides mild improvement. Compliance problems include diet and exercise.   Hyperlipidemia This is a chronic problem. The current episode started more than 1 year ago. The problem is uncontrolled. Recent lipid tests were reviewed and are high. Exacerbating diseases include diabetes and obesity. Factors aggravating her hyperlipidemia include fatty foods. Current antihyperlipidemic treatment includes statins. The  current treatment provides mild improvement of lipids. Compliance problems include adherence to diet and adherence to exercise.  Risk factors for coronary artery disease include diabetes mellitus, dyslipidemia, hypertension, obesity, a sedentary lifestyle and stress.    Review of systems  Constitutional: +steadily increasing body weight,  current Body mass index is 37.92 kg/m. , no fatigue, no subjective hyperthermia, no subjective hypothermia Eyes: no blurry vision, no xerophthalmia ENT: no sore throat, no nodules palpated in throat, no dysphagia/odynophagia, no hoarseness Cardiovascular: no chest pain, no shortness of breath, no palpitations, no leg swelling Respiratory: no cough, no shortness of breath Gastrointestinal: no nausea/vomiting/diarrhea Musculoskeletal: no muscle/joint aches Skin: no rashes, no hyperemia Neurological: no tremors, no numbness, no tingling, no dizziness Psychiatric: no depression, no anxiety  Objective:     BP 124/89    Pulse 90    Ht 5' 4.5" (1.638 m)    Wt 224 lb 6.4 oz (101.8 kg)    SpO2 98%    BMI 37.92 kg/m   Wt Readings from Last 3  Encounters:  03/23/21 224 lb 6.4 oz (101.8 kg)  03/23/21 224 lb 6.4 oz (101.8 kg)  02/16/21 220 lb 6.4 oz (100 kg)    BP Readings from Last 3 Encounters:  03/23/21 124/89  02/16/21 (!) 129/92  02/07/21 (!) 143/94     Physical Exam- Limited  Constitutional:  Body mass index is 37.92 kg/m. , not in acute distress, normal state of mind Eyes:  EOMI, no exophthalmos Neck: Supple Cardiovascular: RRR, no murmurs, rubs, or gallops, no edema Respiratory: Adequate breathing efforts, no crackles, rales, rhonchi, or wheezing Musculoskeletal: no gross deformities, strength intact in all four extremities, no gross restriction of joint movements Skin:  no rashes, no hyperemia Neurological: no tremor with outstretched hands    CMP ( most recent) CMP     Component Value Date/Time   NA 136 12/31/2020 0806   K 3.9 12/31/2020 0806   CL 97 12/31/2020 0806   CO2 22 12/31/2020 0806   GLUCOSE 222 (H) 12/31/2020 0806   GLUCOSE 318 (H) 07/10/2019 0506   BUN 13 12/31/2020 0806   CREATININE 0.63 12/31/2020 0806   CREATININE 0.77 01/14/2016 1514   CALCIUM 9.3 12/31/2020 0806   PROT 6.7 12/31/2020 0806   ALBUMIN 3.9 12/31/2020 0806   AST 19 12/31/2020 0806   ALT 26 12/31/2020 0806   ALKPHOS 82 12/31/2020 0806   BILITOT <0.2 12/31/2020 0806   GFRNONAA 118 01/29/2020 0926   GFRNONAA >89 01/14/2016 1514   GFRAA 136 01/29/2020 0926   GFRAA >89 01/14/2016 1514     Diabetic Labs (most recent): Lab Results  Component Value Date   HGBA1C 11.6 (A) 01/01/2021   HGBA1C 11.3 (A) 09/28/2020   HGBA1C 11.9 (A) 06/23/2020     Lipid Panel ( most recent) Lipid Panel     Component Value Date/Time   CHOL 132 12/31/2020 0806   TRIG 205 (H) 12/31/2020 0806   HDL 35 (L) 12/31/2020 0806   CHOLHDL 3.8 12/31/2020 0806   CHOLHDL 5.1 07/10/2019 0506   VLDL 32 07/10/2019 0506   LDLCALC 63 12/31/2020 0806   LABVLDL 34 12/31/2020 0806      Lab Results  Component Value Date   TSH 3.180 12/31/2020    TSH 2.00 01/14/2016   FREET4 1.23 12/31/2020           Assessment & Plan:   1) Type 2 diabetes mellitus without complication, without long-term current use of insulin ([place])  - Shelisa B  Regenia Lamy has currently uncontrolled symptomatic type 2 DM since 31 years of age.   Recent labs reviewed.  She presents today with her CGM, no logs, showing improving glycemic profile with above target fasting and postprandial readings.  She ws not due for another A1c today.  Analysis of her CGM shows TIR 29%, TAR 71%, TBR 0% with a GMI of 8.7%.  She is seeing improvements in her readings now that she has CGM and knows what foods cause significant elevation.  - I had a long discussion with her about the progressive nature of diabetes and the pathology behind its complications.  -She does not currently have any complications from diabetes but she remains at a high risk for more acute and chronic complications which include CAD, CVA, CKD, retinopathy, and neuropathy. These are all discussed in detail with her.  - Nutritional counseling repeated at each appointment due to patients tendency to fall back in to old habits.  - The patient admits there is a room for improvement in their diet and drink choices. -  Suggestion is made for the patient to avoid simple carbohydrates from their diet including Cakes, Sweet Desserts / Pastries, Ice Cream, Soda (diet and regular), Sweet Tea, Candies, Chips, Cookies, Sweet Pastries, Store Bought Juices, Alcohol in Excess of 1-2 drinks a day, Artificial Sweeteners, Coffee Creamer, and "Sugar-free" Products. This will help patient to have stable blood glucose profile and potentially avoid unintended weight gain.   - I encouraged the patient to switch to unprocessed or minimally processed complex starch and increased protein intake (animal or plant source), fruits, and vegetables.   - Patient is advised to stick to a routine mealtimes to eat 3 meals a day and avoid unnecessary  snacks (to snack only to correct hypoglycemia).  - she has been scheduled with Jearld Fenton, RDN, CDE for diabetes education- saw her prior to her visit with me today.  - I have approached her with the following individualized plan to manage  her diabetes and patient agrees:   -She is advised to continue her current dose of Toujeo 100 units SQ nightly, Fiasp 18-24 units TID with meals if glucose is above 90 and she is eating (Specific instructions on how to titrate insulin dosage based on glucose readings given to patient in writing), Metformin 500 mg po twice daily with meals, and Glipizide 10 mg XL daily with breakfast.  I did discuss and increase her Trulicity to 3 mg weekly (can double up on current 1.5 mg dose until she depletes supply, then increase to 4.5 mg SQ weekly thereafter.  Her triglycerides were improving at last check.    -she is encouraged to consistently monitor blood glucose 4 times daily (using her CGM), and to call the clinic if she has readings less than 70 or above 300 for 3 tests in a row.  - she is warned not to take insulin without proper monitoring per orders. - Adjustment parameters are given to her for hypo and hyperglycemia in writing.  - Specific targets for  A1c;  LDL, HDL,  and Triglycerides were discussed with the patient.  2) Blood Pressure /Hypertension:  Her blood pressure is controlled to target.  She is advised to continue Amlodipine 5 mg po daily, and Lisinopril 10 mg po daily.   3) Lipids/Hyperlipidemia:    Her most recent lipid panel from 12/31/20 shows improved and controlled LDL of 63 and elevated but improved triglycerides of 205.   She is advised to continue Lipitor  10 mg po daily at bedtime.  Side effects and precautions discussed with her.  She was advised to avoid fried foods and butter.    4)  Weight/Diet:  her Body mass index is 37.92 kg/m.  - clearly complicating her diabetes care.   she is a candidate for weight loss. I discussed with her  the fact that loss of 5 - 10% of her  current body weight will have the most impact on her diabetes management.  Exercise, and detailed carbohydrates information provided  -  detailed on discharge instructions.  5) Chronic Care/Health Maintenance: -she is on ACEI/ARB and Statin medications and is encouraged to initiate and continue to follow up with Ophthalmology, Dentist,  Podiatrist at least yearly or according to recommendations, and advised to stay away from smoking. I have recommended yearly flu vaccine and pneumonia vaccine at least every 5 years; moderate intensity exercise for up to 150 minutes weekly; and  sleep for at least 7 hours a day.  - she is  advised to maintain close follow up with Coral Spikes, DO for primary care needs, as well as her other providers for optimal and coordinated care.     I spent 46 minutes in the care of the patient today including review of labs from [place], Lipids, Thyroid Function, Hematology (current and previous including abstractions from other facilities); face-to-face time discussing  her blood glucose readings/logs, discussing hypoglycemia and hyperglycemia episodes and symptoms, medications doses, her options of short and long term treatment based on the latest standards of care / guidelines;  discussion about incorporating lifestyle medicine;  and documenting the encounter.    Please refer to Patient Instructions for Blood Glucose Monitoring and Insulin/Medications Dosing Guide"  in media tab for additional information. Please  also refer to " Patient Self Inventory" in the Media  tab for reviewed elements of pertinent patient history.  Netta Cedars Sams participated in the discussions, expressed understanding, and voiced agreement with the above plans.  All questions were answered to her satisfaction. she is encouraged to contact clinic should she have any questions or concerns prior to her return visit.  Follow up plan: - Return in about 3 months  (around 06/21/2021) for Diabetes F/U with A1c in office, No previsit labs, Bring meter and logs.  Rayetta Pigg, [facility] [address] [place], [place] [address] Phone: [phone] Fax: [phone]  03/23/2021, 3:30 PM

## 2021-03-23 NOTE — Progress Notes (Signed)
Medical Nutrition Therapy DM  Follow up 1400  1430 . Scheduled to see [name],FNP At [facility] today also. NUTRITION ASSESSMENT  Changes made:  She got a treadmill and is ready to start working on exercising. BS reading from her Melanie Mccoy continue to show elevated BS in the evening. Tends to skip breakfast and eats lunch and dinner only. Drinking more water. Trying to buy healthier foods. Eating late at night may be effecting her BS at night. To see [name] FNP at [facility] today. TIR 32%. TAR 68% AVG BS 223 mg/dl. TIR has improved slightly.  Lab Results  Component Value Date   HGBA1C 11.6 (A) 01/01/2021   Has been testing 4 times per day. Has been working on eating meals on time.  Referral diagnosis: e11.8, e78.0 Preferred learning style:  no preference indicated Learning readiness: change in progress  Anthropometrics  Wt Readings from Last 3 Encounters:  02/16/21 220 lb 6.4 oz (100 kg)  01/19/21 216 lb 12.8 oz (98.3 kg)  01/01/21 215 lb 6.4 oz (97.7 kg)   Ht Readings from Last 3 Encounters:  02/16/21 5' 4.5" (1.638 m)  01/19/21 5' 4.5" (1.638 m)  01/01/21 5' 4.5" (1.638 m)   There is no height or weight on file to calculate BMI. @BMIFA @ Facility age limit for growth percentiles is 20 years. Facility age limit for growth percentiles is 20 years. Clinical Medical Hx: HTN and Hyperlipidemai Medications: Toujeo 100, Humalog 18 units with meals, Trulicity weekly, metformin 500 BID and Glipizide 10 mg Labs: FBS: 200-300's.  Lab Results  Component Value Date   HGBA1C 11.6 (A) 01/01/2021   CMP Latest Ref Rng & Units 12/31/2020 09/25/2020 01/29/2020  Glucose 70 - 99 mg/dL 01/31/2020) 161(W) 960(A)  BUN 6 - 20 mg/dL 13 14 8   Creatinine 0.57 - 1.00 mg/dL 540(J 8.11  Sodium 134 - 144 mmol/L 136 135 139  Potassium 3.5 - 5.2 mmol/L 3.9 4.5 3.8  Chloride 96 - 106 mmol/L 97 97 99  CO2 20 - 29 mmol/L 22 21 22   Calcium 8.7 - 10.2 mg/dL 9.3 9.5 9.4  Total  Protein 6.0 - 8.5 g/dL 6.7 6.7 7.0  Total Bilirubin 0.0 - 1.2 mg/dL 9.14 0.4 0.2  Alkaline Phos 44 - 121 IU/L 82 73 77  AST 0 - 40 IU/L 19 26 25   ALT 0 - 32 IU/L 26 26 49(H)   Lipid Panel     Component Value Date/Time   CHOL 132 12/31/2020 0806   TRIG 205 (H) 12/31/2020 0806   HDL 35 (L) 12/31/2020 0806   CHOLHDL 3.8 12/31/2020 0806   CHOLHDL 5.1 07/10/2019 0506   VLDL 32 07/10/2019 0506   LDLCALC 63 12/31/2020 0806   LABVLDL 34 12/31/2020 0806   Notable Signs/Symptoms: Fatigue, increased thirst, frequent urination, blurry vision  Lifestyle & Dietary Hx She works 1 job now and stress is getting better.  Estimated daily fluid intake: 100 oz Supplements: none Sleep: 4-7 hrs  Stress / self-care: jobs are stressing Current average weekly physical activity: 2 hours    Estimated Energy Needs Calories: 1200-1400 Carbohydrate: 135g Protein: 90g Fat: 33g   NUTRITION DIAGNOSIS  NB-1.1 Food and nutrition-related knowledge deficit As related to Diabetes Type 2.  As evidenced by A1C 10.4%.  NUTRITION INTERVENTION  Meal planning, prevention of complications of DM. How insulin works and benefits of short acting insulin.   Reviewed emotional eating, need to focus on more nutrient dense food choices and not skipping meals to lose  weight. Breakfast: skipped Lunch: healthy veggies at [employer], water Dinner: steak corn, turnip greens,biscuits, water     Handouts Provided Include  Emotional eating  Learning Style & Readiness for Change Teaching method utilized: Visual & Auditory  Demonstrated degree of understanding via: Teach Back  Barriers to learning/adherence to lifestyle change: None  Goals Established by Pt Goals  Eat three meals per day Don't skip breakfast Use sling scale correctly. Drink water only Walk on treadmill 30 minutes 3 times per week. Get TIR to 75%     MONITORING & EVALUATION Dietary intake, weekly physical activity, and blood sugars  in 3 month.    Next Steps  Will continue to work on meal planning.

## 2021-03-24 ENCOUNTER — Encounter: Payer: Self-pay | Admitting: Nutrition

## 2021-03-24 NOTE — Patient Instructions (Signed)
Goals  Eat three meals per day Don't skip breakfast Use sling scale correctly. Drink water only Walk on treadmill 30 minutes 3 times per week. Get TIR to 75%

## 2021-04-12 ENCOUNTER — Ambulatory Visit
Admission: EM | Admit: 2021-04-12 | Discharge: 2021-04-12 | Disposition: A | Discharge status: Home / Self Care | Payer: Self-pay | Attending: Family Medicine | Admitting: Family Medicine

## 2021-04-12 ENCOUNTER — Ambulatory Visit: Payer: Self-pay

## 2021-04-12 ENCOUNTER — Other Ambulatory Visit: Payer: Self-pay

## 2021-04-12 DIAGNOSIS — L02214 Cutaneous abscess of groin: Secondary | ICD-10-CM

## 2021-04-12 MED ORDER — SULFAMETHOXAZOLE-TRIMETHOPRIM 800-160 MG PO TABS: 1.0000 | ORAL_TABLET | Freq: Two times a day (BID) | ORAL | 0 refills | Status: DC

## 2021-04-12 MED ORDER — TRAMADOL HCL 50 MG PO TABS
50.0000 mg | ORAL_TABLET | Freq: Two times a day (BID) | ORAL | 0 refills | Status: DC | PRN
Start: 2021-04-12 — End: 2021-09-13

## 2021-04-12 NOTE — ED Triage Notes (Signed)
Pt presents with abscess in right groin area that developed on Friday

## 2021-04-12 NOTE — ED Provider Notes (Signed)
[facility]    CSN: [id number] Arrival date & time: 04/12/21  0817      History   Chief Complaint Chief Complaint  Patient presents with   Abscess    HPI Melanie Mccoy is a 31 y.o. female.   Patient presenting today with a recurrent right groin abscess.  She states that she came in about 2 months ago when it had flared up and received a week of antibiotics which completely reduced symptoms but started 3 days ago with worsening pain, swelling, redness to the area to where it is now nearly unbearable.  She denies spontaneous drainage, fever, chills.  Has tried warm compresses, over-the-counter pain relievers with no relief.  Of note, she does have a history of type 2 diabetes.   Past Medical History:  Diagnosis Date   Anemia    slightly anemic, iron supplements every 2-3 days   Bartholin cyst    Chlamydia 01/2011   Diabetes mellitus without complication (HCC)    Family history of anesthesia complication    grandmother had spinal with nerve damage   No pertinent past medical history     Patient Active Problem List   Diagnosis Date Noted   Abscess, groin 12/31/2020   Essential hypertension 09/18/2019   Type 2 diabetes mellitus without complication, without long-term current use of insulin (HCC) 01/19/2016   Acne vulgaris 04/15/2014   Morbid obesity (HCC) 04/15/2014   PCOS (polycystic ovarian syndrome) 04/15/2014   Status post primary low transverse cesarean section 11/22/2012    Past Surgical History:  Procedure Laterality Date   CESAREAN SECTION N/A 11/22/2012   Procedure: PRIMARY CESAREAN SECTION;  Surgeon: Esmeralda ArthurSandra A Rivard, MD;  Location: [facility];  Service: Obstetrics;  Laterality: N/A;   CYST REMOVAL TRUNK  2005    OB History     Gravida  2   Para  2   Term      Preterm      AB      Living  1      SAB      IAB      Ectopic      Multiple      Live Births  1            Home Medications    Prior to Admission medications    Medication Sig Start Date End Date Taking? Authorizing Provider  sulfamethoxazole-trimethoprim (BACTRIM DS) 800-160 MG tablet Take 1 tablet by mouth 2 (two) times daily for 7 days. 04/12/21 04/19/21 Yes Particia NearingLane, Marquette Piontek Elizabeth, PA-C  traMADol (ULTRAM) 50 MG tablet Take 1 tablet (50 mg total) by mouth every 12 (twelve) hours as needed. 04/12/21  Yes Particia NearingLane, Sophia Sperry Elizabeth, PA-C  amLODipine (NORVASC) 5 MG tablet TAKE 1 TABLET(5 MG) BY MOUTH IN THE MORNING 03/17/21   Dani Gobbleeardon, [name] J, NP  atorvastatin (LIPITOR) 10 MG tablet TAKE 1 TABLET(10 MG) BY MOUTH DAILY 03/17/21   Dani Gobbleeardon, [name] J, NP  Continuous Blood Gluc Sensor (FREESTYLE LIBRE 3 SENSOR) MISC 1 application by Does not apply route every 14 (fourteen) days. Place 1 sensor on the skin every 14 days. Use to check glucose continuously 01/19/21   Dani Gobbleeardon, [name] J, NP  Dulaglutide (TRULICITY) 4.5 MG/0.5ML SOPN Inject 4.5 mg as directed once a week. 03/23/21   Dani Gobbleeardon, [name] J, NP  fluconazole (DIFLUCAN) 150 MG tablet Take 2 tablets (300 mg total) by mouth once a week. For 2 weeks. 03/19/21   Tommie Samsook, Jayce G, DO  glipiZIDE (GLUCOTROL  XL) 10 MG 24 hr tablet TAKE 1 TABLET BY MOUTH EVERY MORNING 03/16/21   Everlene Other G, DO  glucose blood test strip Use as instructed 01/14/19   Campbell Riches, NP  insulin aspart (FIASP FLEXTOUCH) 100 UNIT/ML FlexTouch Pen Inject 18-24 Units into the skin 3 (three) times daily before meals. 03/09/21   Dani Gobble, NP  insulin glargine, 2 Unit Dial, (TOUJEO MAX SOLOSTAR) 300 UNIT/ML Solostar Pen Inject 100 Units into the skin at bedtime. 03/03/21   Roma Kayser, MD  Insulin Pen Needle (BD PEN NEEDLE NANO 2ND GEN) 32G X 4 MM MISC Use to inject insulin 4 times daily. 03/23/21   Dani Gobble, NP  Lancets (ACCU-CHEK SOFT TOUCH) lancets Use as instructed 01/14/19   Campbell Riches, NP  lisinopril (ZESTRIL) 10 MG tablet Take 1 tablet (10 mg total) by mouth daily. 01/01/21   Dani Gobble, NP  metFORMIN  (GLUCOPHAGE) 500 MG tablet TAKE 1 TABLET(500 MG) BY MOUTH TWICE DAILY WITH A MEAL 01/01/21   Dani Gobble, NP  norgestimate-ethinyl estradiol (MILI) 0.25-35 MG-MCG tablet Take 1 tablet by mouth daily. 07/09/20   Annalee Genta, DO    Family History Family History  Problem Relation Age of Onset   Cancer Other    Heart disease Other    Diabetes Other    Diabetes Mother    Diabetes Father    Diabetes Maternal Grandfather     Social History Social History   Tobacco Use   Smoking status: Never   Smokeless tobacco: Never  Vaping Use   Vaping Use: Never used  Substance Use Topics   Alcohol use: No    Comment: Occassionally   Drug use: No     Allergies   Bee venom, Nickel, and Ibuprofen   Review of Systems Review of Systems Per HPI Physical Exam Triage Vital Signs ED Triage Vitals  Enc Vitals Group     BP 04/12/21 0839 131/82     Pulse Rate 04/12/21 0839 (!) 109     Resp 04/12/21 0839 18     Temp 04/12/21 0839 98.7 F (37.1 C)     Temp src --      SpO2 04/12/21 0839 95 %     Weight --      Height --      Head Circumference --      Peak Flow --      Pain Score 04/12/21 0840 9     Pain Loc --      Pain Edu? --      Excl. in GC? --    No data found.  Updated Vital Signs BP 131/82    Pulse (!) 109    Temp 98.7 F (37.1 C)    Resp 18    LMP 03/29/2021 (Approximate)    SpO2 95%   Visual Acuity Right Eye Distance:   Left Eye Distance:   Bilateral Distance:    Right Eye Near:   Left Eye Near:    Bilateral Near:     Physical Exam Vitals and nursing note reviewed.  Constitutional:      Appearance: Normal appearance. She is not ill-appearing.  HENT:     Head: Atraumatic.  Eyes:     Extraocular Movements: Extraocular movements intact.     Conjunctiva/sclera: Conjunctivae normal.  Cardiovascular:     Rate and Rhythm: Normal rate and regular rhythm.     Heart sounds: Normal heart sounds.  Pulmonary:  Effort: Pulmonary effort is normal.      Breath sounds: Normal breath sounds.  Musculoskeletal:        General: Normal range of motion.     Cervical back: Normal range of motion and neck supple.  Skin:    General: Skin is warm.     Comments: 2.5 cm area of erythema, fluctuance and induration right groin fold onto mons pubis.  Significantly tender to palpation.  No spontaneous drainage or bleeding.  Neurological:     Mental Status: She is alert and oriented to person, place, and time.  Psychiatric:        Mood and Affect: Mood normal.        Thought Content: Thought content normal.        Judgment: Judgment normal.     UC Treatments / Results  Labs (all labs ordered are listed, but only abnormal results are displayed) Labs Reviewed - No data to display  EKG   Radiology No results found.  Procedures Incision and Drainage  Date/Time: 04/12/2021 9:38 AM Performed by: Particia Nearing, PA-C Authorized by: Particia Nearing, PA-C   Consent:    Consent obtained:  Verbal   Consent given by:  Patient   Risks, benefits, and alternatives were discussed: yes     Risks discussed:  Bleeding, incomplete drainage, infection and pain   Alternatives discussed:  Alternative treatment Universal protocol:    Procedure explained and questions answered to patient or proxy's satisfaction: yes     Relevant documents present and verified: yes     Patient identity confirmed:  Verbally with patient Location:    Type:  Abscess   Location: Right groin. Pre-procedure details:    Skin preparation:  Chlorhexidine with alcohol Sedation:    Sedation type:  None Anesthesia:    Anesthesia method:  Local infiltration   Local anesthetic:  Lidocaine 1% WITH epi Procedure type:    Complexity:  Simple Procedure details:    Ultrasound guidance: no     Needle aspiration: no     Incision types:  Stab incision   Incision depth:  Dermal   Wound management:  Probed and deloculated   Drainage:  Purulent   Drainage amount:  Copious    Wound treatment:  Wound left open   Packing materials:  None Post-procedure details:    Procedure completion:  Tolerated well, no immediate complications (including critical care time)  Medications Ordered in UC Medications - No data to display  Initial Impression / Assessment and Plan / UC Course  I have reviewed the triage vital signs and the nursing notes.  Pertinent labs & imaging results that were available during my care of the patient were reviewed by me and considered in my medical decision making (see chart for details).     Abscess drained with I&D procedure without complication.  Will place on 1 week of antibiotics, home wound care reviewed and follow-up with PCP or general surgery outpatient to discuss excision of the cyst sac given recurrent nature of the issue.  Small supply of tramadol given in addition to Tylenol for pain relief.  PDMP reviewed and appropriate.  Final Clinical Impressions(s) / UC Diagnoses   Final diagnoses:  Groin abscess   Discharge Instructions   None    ED Prescriptions     Medication Sig Dispense Auth. Provider   sulfamethoxazole-trimethoprim (BACTRIM DS) 800-160 MG tablet Take 1 tablet by mouth 2 (two) times daily for 7 days. 14 tablet Particia Nearing, [place]  traMADol (ULTRAM) 50 MG tablet Take 1 tablet (50 mg total) by mouth every 12 (twelve) hours as needed. 10 tablet Particia Nearing, [place]      I have reviewed the PDMP during this encounter.   Roosvelt Maser [place], [place] 04/12/21 [phone]

## 2021-04-13 ENCOUNTER — Telehealth: Payer: Self-pay | Admitting: Nurse Practitioner

## 2021-04-13 DIAGNOSIS — E119 Type 2 diabetes mellitus without complications: Secondary | ICD-10-CM

## 2021-04-13 MED ORDER — BD PEN NEEDLE NANO 2ND GEN 32G X 4 MM MISC: 3 refills | Status: DC

## 2021-04-13 NOTE — Telephone Encounter (Signed)
Pt called and states that her insurance will not cover the 1,000 box of pen needles and the pharmacy didn't tell her what they would cover. She also states the rx states 4x a day but she is injecting 5x a day since her and [name] discussed splitting up her nighttime insulin. Pt is requesting a new rx sent in and a call back.   [employer]  [phone]

## 2021-04-13 NOTE — Telephone Encounter (Signed)
Can you take a look at this message?

## 2021-04-13 NOTE — Telephone Encounter (Signed)
Noted  

## 2021-04-14 ENCOUNTER — Other Ambulatory Visit: Payer: Self-pay

## 2021-04-14 ENCOUNTER — Ambulatory Visit: Payer: Self-pay | Admitting: Family Medicine

## 2021-04-14 VITALS — BP 138/100 | HR 100 | Temp 98.3°F | Ht 64.5 in | Wt 223.4 lb

## 2021-04-14 DIAGNOSIS — L02214 Cutaneous abscess of groin: Secondary | ICD-10-CM

## 2021-04-14 DIAGNOSIS — E782 Mixed hyperlipidemia: Secondary | ICD-10-CM | POA: Insufficient documentation

## 2021-04-14 DIAGNOSIS — L089 Local infection of the skin and subcutaneous tissue, unspecified: Secondary | ICD-10-CM | POA: Diagnosis not present

## 2021-04-14 MED ORDER — DOXYCYCLINE HYCLATE 100 MG PO TABS: 100.0000 mg | ORAL_TABLET | Freq: Two times a day (BID) | ORAL | 0 refills | Status: DC

## 2021-04-14 NOTE — Progress Notes (Signed)
Subjective:  Patient ID: Melanie Mccoy, female    DOB: 07/01/1990  Age: 31 y.o. MRN: [id number]  CC: Chief Complaint  Patient presents with   follow up    From [facility] for abscess in groin area. Patient says it's a hard knot there. Wants to talk about surgery to remove sack of abscess.    HPI:  31 year old female presents with the above complaint.  I have previously seen the patient for the same issue in October 2022.  Had an abscess of the groin in December.  And now is having an additional recurrence.  Most recently seen on 2/6.  Note from urgent care states that incision and drainage was performed.  No packing was placed.  Patient states that she was unable to visualize the incision after the procedure.  She had no additional drainage at home.  She is now having pain and swelling.  Patient would like her abscess drained today.  No fever.  She is currently on Bactrim.  Patient Active Problem List   Diagnosis Date Noted   Mixed hyperlipidemia 04/14/2021   Severe obesity (BMI 35.0-39.9) with comorbidity ([place]) 04/14/2021   Abscess, groin 12/31/2020   Essential hypertension 09/18/2019   Uncontrolled type 2 diabetes mellitus with hyperglycemia ([place]) 01/19/2016   PCOS (polycystic ovarian syndrome) 04/15/2014    Social Hx   Social History   Socioeconomic History   Marital status: Single    Spouse name: Not on file   Number of children: Not on file   Years of education: Not on file   Highest education level: Not on file  Occupational History   Not on file  Tobacco Use   Smoking status: Never   Smokeless tobacco: Never  Vaping Use   Vaping Use: Never used  Substance and Sexual Activity   Alcohol use: No    Comment: Occassionally   Drug use: No   Sexual activity: Yes  Other Topics Concern   Not on file  Social History Narrative   Not on file   Social Determinants of Health   Financial Resource Strain: Not on file  Food Insecurity: Not on file   Transportation Needs: Not on file  Physical Activity: Not on file  Stress: Not on file  Social Connections: Not on file    Review of Systems Per HPI  Objective:  BP (!) 138/100    Pulse 100    Temp 98.3 F (36.8 C) (Oral)    Ht 5' 4.5" (1.638 m)    Wt 223 lb 6.4 oz (101.3 kg)    LMP 03/29/2021 (Approximate)    SpO2 99%    BMI 37.75 kg/m   BP/Weight 04/14/2021 04/12/2021 [id number]  Systolic BP 0000000 [id number] -  Diastolic BP 123XX123 82 -  Wt. (Lbs) 223.4 - 224.4  BMI 37.75 - 39.75    Physical Exam Constitutional:      General: She is not in acute distress.    Appearance: Normal appearance. She is obese.  Cardiovascular:     Rate and Rhythm: Normal rate and regular rhythm.  Pulmonary:     Effort: Pulmonary effort is normal.     Breath sounds: Normal breath sounds.  Skin:    Comments: Large fluctuant abscess noted in the right inguinal region.  Tender to palpation.  Surrounding induration.  Neurological:     Mental Status: She is alert.  Psychiatric:        Mood and Affect: Mood normal.  Behavior: Behavior normal.    Lab Results  Component Value Date   WBC 8.7 01/29/2020   HGB 14.6 01/29/2020   HCT 42.5 01/29/2020   PLT 298 01/29/2020   GLUCOSE 222 (H) 12/31/2020   CHOL 132 12/31/2020   TRIG 205 (H) 12/31/2020   HDL 35 (L) 12/31/2020   LDLCALC 63 12/31/2020   ALT 26 12/31/2020   AST 19 12/31/2020   NA 136 12/31/2020   K 3.9 12/31/2020   CL 97 12/31/2020   CREATININE 0.63 12/31/2020   BUN 13 12/31/2020   CO2 22 12/31/2020   TSH 3.180 12/31/2020   INR 1.15 02/11/2011   HGBA1C 11.6 (A) 01/01/2021   MICROALBUR 80 01/01/2021   Procedure: Incision and drainage Location: Right inguinal region Area was cleansed with Betadine. Local anesthesia was achieved with 1% lidocaine with epinephrine. Small incision was made with an 11 blade.  There was copious amounts of purulent and bloody drainage. Wound was packed with 1/4 inch packing. Patient tolerated procedure well.  No  complications.  Assessment & Plan:   Problem List Items Addressed This Visit       Other   Severe obesity (BMI 35.0-39.9) with comorbidity (HCC)   Abscess, groin - Primary    Recurrent abscess.  Incision and drainage performed today.  There was copious amounts of drainage.  Wound was packed.  I am placing the patient on 14 days of doxycycline.  Patient is to remove packing in 2 days.  Given recurrent abscesses, patient wanted to be referred to a surgeon to discuss additional treatment options and preventative options.  Will place referral at her request.      Relevant Orders   Ambulatory referral to General Surgery   Other Visit Diagnoses     Recurrent infection of skin       Relevant Orders   Ambulatory referral to General Surgery       Meds ordered this encounter  Medications   doxycycline (VIBRA-TABS) 100 MG tablet    Sig: Take 1 tablet (100 mg total) by mouth 2 (two) times daily.    Dispense:  28 tablet    Refill:  [facility]

## 2021-04-14 NOTE — Assessment & Plan Note (Signed)
Recurrent abscess.  Incision and drainage performed today.  There was copious amounts of drainage.  Wound was packed.  I am placing the patient on 14 days of doxycycline.  Patient is to remove packing in 2 days.  Given recurrent abscesses, patient wanted to be referred to a surgeon to discuss additional treatment options and preventative options.  Will place referral at her request.

## 2021-04-14 NOTE — Patient Instructions (Signed)
Remove packing in 48 hours.  Stop bactrim. Start Doxy.  I will refer you to general surgery per your request.  Take care  Dr. Adriana Simas

## 2021-04-15 ENCOUNTER — Other Ambulatory Visit: Payer: Self-pay | Admitting: Family Medicine

## 2021-04-15 ENCOUNTER — Telehealth: Payer: Self-pay | Admitting: Family Medicine

## 2021-04-15 DIAGNOSIS — I10 Essential (primary) hypertension: Secondary | ICD-10-CM

## 2021-04-15 MED ORDER — KETOROLAC TROMETHAMINE 10 MG PO TABS: 10.0000 mg | ORAL_TABLET | Freq: Four times a day (QID) | ORAL | 0 refills | Status: DC | PRN

## 2021-04-15 NOTE — Telephone Encounter (Signed)
Patient states she has been taking the tramadol and it has no been touching the pain and she needs something else.

## 2021-04-15 NOTE — Telephone Encounter (Signed)
Patient notified and verbalized understanding. 

## 2021-04-15 NOTE — Telephone Encounter (Signed)
Patient was seen yesterday but having sharp pain today and would like something called into [facility]

## 2021-04-15 NOTE — Telephone Encounter (Signed)
Thersa Salt G, DO    Tramadol was sent by Urgent Care. I recommend using that.

## 2021-04-15 NOTE — Telephone Encounter (Signed)
Coral Spikes, DO   Rx sent for PO Toradol

## 2021-04-29 ENCOUNTER — Encounter: Payer: Self-pay | Admitting: Surgery

## 2021-04-29 ENCOUNTER — Ambulatory Visit (INDEPENDENT_AMBULATORY_CARE_PROVIDER_SITE_OTHER): Payer: 59 | Admitting: Surgery

## 2021-04-29 ENCOUNTER — Other Ambulatory Visit: Payer: Self-pay

## 2021-04-29 ENCOUNTER — Ambulatory Visit (INDEPENDENT_AMBULATORY_CARE_PROVIDER_SITE_OTHER): Payer: 59 | Admitting: Family Medicine

## 2021-04-29 VITALS — BP 144/104 | HR 110 | Temp 98.3°F | Resp 14 | Ht 64.5 in | Wt 223.0 lb

## 2021-04-29 DIAGNOSIS — L02214 Cutaneous abscess of groin: Secondary | ICD-10-CM | POA: Diagnosis not present

## 2021-04-29 MED ORDER — OXYCODONE HCL 5 MG PO TABS
5.0000 mg | ORAL_TABLET | Freq: Four times a day (QID) | ORAL | 0 refills | Status: DC | PRN
Start: 2021-04-29 — End: 2021-09-13

## 2021-04-29 MED ORDER — SULFAMETHOXAZOLE-TRIMETHOPRIM 400-80 MG PO TABS: 1.0000 | ORAL_TABLET | Freq: Two times a day (BID) | ORAL | 0 refills | Status: DC

## 2021-04-29 NOTE — Progress Notes (Signed)
Subjective:  Patient ID: Melanie Mccoy, female    DOB: 1990-03-09  Age: 31 y.o. MRN: [id number]  CC: Chief Complaint  Patient presents with   abscess groin area     Right side and hard spot on left groin     HPI:  31 year old female presents with recurrent abscess.  I have seen the patient on 2 prior occasions for the same abscess in the right inguinal region.  Last occurrence was earlier this month.  I saw her on 2/8 and performed incision and drainage.  Patient endorses recurrence as of Monday.  Abscess is now very large and quite painful.  No current drainage.  No fever.  She has had issues with recurrent abscess in the right inguinal region since June 2022 (per the EMR).    Patient Active Problem List   Diagnosis Date Noted   Mixed hyperlipidemia 04/14/2021   Severe obesity (BMI 35.0-39.9) with comorbidity (HCC) 04/14/2021   Abscess, groin 12/31/2020   Essential hypertension 09/18/2019   Uncontrolled type 2 diabetes mellitus with hyperglycemia (HCC) 01/19/2016   PCOS (polycystic ovarian syndrome) 04/15/2014    Social Hx   Social History   Socioeconomic History   Marital status: Single    Spouse name: Not on file   Number of children: Not on file   Years of education: Not on file   Highest education level: Not on file  Occupational History   Not on file  Tobacco Use   Smoking status: Never   Smokeless tobacco: Never  Vaping Use   Vaping Use: Never used  Substance and Sexual Activity   Alcohol use: No    Comment: Occassionally   Drug use: No   Sexual activity: Yes  Other Topics Concern   Not on file  Social History Narrative   Not on file   Social Determinants of Health   Financial Resource Strain: Not on file  Food Insecurity: Not on file  Transportation Needs: Not on file  Physical Activity: Not on file  Stress: Not on file  Social Connections: Not on file    Review of Systems Per HPI  Objective:  BP (!) 145/108    Pulse 95    Temp 98.3 F  (36.8 C)    Ht 5' 4.5" (1.638 m)    Wt 224 lb (101.6 kg)    SpO2 98%    BMI 37.86 kg/m   BP/Weight 04/29/2021 04/14/2021 04/12/2021  Systolic BP 145 138 131  Diastolic BP 108 100 82  Wt. (Lbs) 224 223.4 -  BMI 37.86 37.75 -    Physical Exam Constitutional:      General: She is not in acute distress.    Appearance: She is obese.  Pulmonary:     Effort: Pulmonary effort is normal. No respiratory distress.  Genitourinary:    Comments: Large abscess of the right inguinal region.  Fluctuant.  Tender to palpation. Neurological:     Mental Status: She is alert.    Lab Results  Component Value Date   WBC 8.7 01/29/2020   HGB 14.6 01/29/2020   HCT 42.5 01/29/2020   PLT 298 01/29/2020   GLUCOSE 222 (H) 12/31/2020   CHOL 132 12/31/2020   TRIG 205 (H) 12/31/2020   HDL 35 (L) 12/31/2020   LDLCALC 63 12/31/2020   ALT 26 12/31/2020   AST 19 12/31/2020   NA 136 12/31/2020   K 3.9 12/31/2020   CL 97 12/31/2020   CREATININE 0.63 12/31/2020   BUN  13 12/31/2020   CO2 22 12/31/2020   TSH 3.180 12/31/2020   INR 1.15 02/11/2011   HGBA1C 11.6 (A) 01/01/2021   MICROALBUR 80 01/01/2021     Assessment & Plan:   Problem List Items Addressed This Visit       Other   Abscess, groin    Recurrent abscess.  Patient has just finished antibiotic therapy.  Now having reoccurrence.  General surgery to see the patient today.  I appreciate their availability in such a quick fashion.       Everlene Other DO [facility]

## 2021-04-29 NOTE — Progress Notes (Signed)
[facility] History and Physical  Reason for Referral: Right groin abscess Referring Physician: Thersa Salt, DO  Chief Complaint   New Patient (Initial Visit)     Melanie Mccoy is a 31 y.o. female.  HPI: Patient presents for evaluation of right groin abscess.  She states that this abscess has been present for several months.  It has flared 3-4 times over the last few months, and she has required I&D of the abscess 4 times by urgent care and and his primary care doctor.  She has received 2 different cycles of doxycycline, with her most recent cycle ending yesterday, and it was 14 days long.  She thinks she might have been on Bactrim in the past.  Her last I&D of the site was 2-1/2 weeks ago, and it was packed at that time.  She states that after she removed the packing, she did not note any further drainage from the site.  Monday of this week, she noted increased swelling of her right groin and pain started a day or 2 later.  She denies any drainage from this area.  She has a history of pilonidal cyst excision when she was 31 years old.  Her medical history significant for diabetes and hypertension, and she states that her sugars have been poorly controlled since his abscess has been around.  She denies use of blood thinning medications.  She denies use of tobacco products and illicit drugs.  She occasionally will drink alcohol.  Past Medical History:  Diagnosis Date   Anemia    slightly anemic, iron supplements every 2-3 days   Bartholin cyst    Chlamydia 01/2011   Diabetes mellitus without complication (HCC)    Family history of anesthesia complication    grandmother had spinal with nerve damage   No pertinent past medical history     Past Surgical History:  Procedure Laterality Date   CESAREAN SECTION N/A 11/22/2012   Procedure: PRIMARY CESAREAN SECTION;  Surgeon: Alwyn Pea, MD;  Location: [facility];  Service: Obstetrics;  Laterality: N/A;   CYST REMOVAL TRUNK   2005    Family History  Problem Relation Age of Onset   Cancer Other    Heart disease Other    Diabetes Other    Diabetes Mother    Diabetes Father    Diabetes Maternal Grandfather     Social History   Tobacco Use   Smoking status: Never   Smokeless tobacco: Never  Vaping Use   Vaping Use: Never used  Substance Use Topics   Alcohol use: No    Comment: Occassionally   Drug use: No    Medications: I have reviewed the patient's current medications. Allergies as of 04/29/2021       Reactions   Bee Venom Swelling   Progressively worse with each sting Other reaction(s): Other   Nickel Rash, Itching   Ibuprofen Hives        Medication List        Accurate as of April 29, 2021  1:41 PM. If you have any questions, ask your nurse or doctor.          STOP taking these medications    doxycycline 100 MG tablet Commonly known as: VIBRA-TABS Stopped by: Rally Ouch A Smaran Gaus, DO       TAKE these medications    accu-chek soft touch lancets Use as instructed   amLODipine 5 MG tablet Commonly known as: NORVASC TAKE 1 TABLET(5 MG) BY MOUTH IN  THE MORNING   atorvastatin 10 MG tablet Commonly known as: LIPITOR TAKE 1 TABLET(10 MG) BY MOUTH DAILY   BD Pen Needle Nano 2nd Gen 32G X 4 MM Misc Generic drug: Insulin Pen Needle Use to inject insulin 5 times daily.   Fiasp FlexTouch 100 UNIT/ML FlexTouch Pen Generic drug: insulin aspart Inject 18-24 Units into the skin 3 (three) times daily before meals.   fluconazole 150 MG tablet Commonly known as: Diflucan Take 2 tablets (300 mg total) by mouth once a week. For 2 weeks.   FreeStyle Libre 3 Sensor Misc 1 application by Does not apply route every 14 (fourteen) days. Place 1 sensor on the skin every 14 days. Use to check glucose continuously   glipiZIDE 10 MG 24 hr tablet Commonly known as: GLUCOTROL XL TAKE 1 TABLET BY MOUTH EVERY MORNING   glucose blood test strip Use as instructed   ketorolac 10  MG tablet Commonly known as: TORADOL Take 1 tablet (10 mg total) by mouth every 6 (six) hours as needed for moderate pain or severe pain.   lisinopril 10 MG tablet Commonly known as: ZESTRIL TAKE 1 TABLET(10 MG) BY MOUTH DAILY   metFORMIN 500 MG tablet Commonly known as: GLUCOPHAGE TAKE 1 TABLET(500 MG) BY MOUTH TWICE DAILY WITH A MEAL   norgestimate-ethinyl estradiol 0.25-35 MG-MCG tablet Commonly known as: Mili Take 1 tablet by mouth daily.   Toujeo Max SoloStar 300 UNIT/ML Solostar Pen Generic drug: insulin glargine (2 Unit Dial) Inject 100 Units into the skin at bedtime.   traMADol 50 MG tablet Commonly known as: ULTRAM Take 1 tablet (50 mg total) by mouth every 12 (twelve) hours as needed.   Trulicity 4.5 MG/0.5ML Sopn Generic drug: Dulaglutide Inject 4.5 mg as directed once a week.         ROS:  Constitutional: negative for chills, fatigue, and fevers Eyes: negative for visual disturbance and pain Ears, nose, mouth, throat, and face: negative for ear drainage, sore throat, and sinus problems Respiratory: negative for cough, wheezing, and shortness of breath Cardiovascular: negative for chest pain and palpitations Gastrointestinal: positive for nausea, negative for abdominal pain, reflux symptoms, and vomiting Genitourinary:negative for dysuria, frequency, and urinary retention Integument/breast: positive for boils and dryness, negative for rash Hematologic/lymphatic: negative for bleeding and lymphadenopathy Musculoskeletal:negative for back pain, neck pain, and joint pain Neurological: negative for dizziness, tremors, and numbness Endocrine: positive for diabetic symptoms including skin dryness and temperature intolerance  Blood pressure (!) 144/104, pulse (!) 110, temperature 98.3 F (36.8 C), temperature source Other (Comment), resp. rate 14, height 5' 4.5" (1.638 m), weight 223 lb (101.2 kg), SpO2 96 %. Physical Exam Vitals reviewed.  Constitutional:       General: She is not in acute distress.    Appearance: Normal appearance. She is not ill-appearing.  HENT:     Head: Atraumatic.  Eyes:     Extraocular Movements: Extraocular movements intact.     Pupils: Pupils are equal, round, and reactive to light.  Cardiovascular:     Rate and Rhythm: Normal rate and regular rhythm.  Pulmonary:     Effort: Pulmonary effort is normal.     Breath sounds: Normal breath sounds.  Abdominal:     General: There is no distension.     Palpations: Abdomen is soft.     Tenderness: There is no abdominal tenderness.  Musculoskeletal:        General: Normal range of motion.     Cervical back: Normal range of  motion.  Skin:    General: Skin is warm.     Comments: Right groin with large abscess, surrounding induration with mild erythema, central fluctuance; tender to palpation  Neurological:     General: No focal deficit present.     Mental Status: She is alert and oriented to person, place, and time.  Psychiatric:        Mood and Affect: Mood normal.        Behavior: Behavior normal.    Results: No results found for this or any previous visit (from the past 48 hour(s)).  No results found.   Assessment & Plan:  Melanie Mccoy is a 31 y.o. female who presents for evaluation of right groin abscess.  -Patient with large right groin abscess with mild surrounding cellulitic changes -Plan for I&D of abscess today in office -The risk and benefits of right groin abscess I&D were discussed, including but not limited to bleeding, worsening infection, or need for additional procedure.  Melanie Mccoy would like to proceed with incision and drainage of her right groin abscess today in office.  Written consent was obtained. -Given the significant size of the abscess with some mild surrounding cellulitis, patient was discharged with 10 days of Bactrim and 15 Roxicodone pills -Patient's abscess was packed with iodoform half-inch packing, and patient was  educated on how to pack her incision, as she will need to keep the area open so that it may continue to drain -Culture was obtained, will follow-up on results to see if adjustments need to be made to her antibiotic regimen -Follow-up was scheduled for me next Wednesday for evaluation of her abscess  All questions were answered to the satisfaction of the patient.   Incision and drainage procedure note: Pre-procedure diagnosis: Right groin abscess Post-procedure diagnosis: Right groin abscess   After consent was obtained for the procedure, the patient was positioned in a supine, frog-leg position. The patient was sterilely prepped and draped with Betadine.  A linear incision was made over the area of fluctuance.  A large amount of purulent drainage was obtained from the abscess cavity. Culture was obtained.  The cavity was probed for any areas of loculations, which were broken up.  It was then copiously irrigated with normal saline.  The cavity was packed with 1/2" iodoform packing.  Dressing was applied of 4x4s and abd pad.  Patient tolerated the procedure without issue.   Graciella Freer, DO [facility] [address] [place], [place] [address] [phone] (office)

## 2021-04-29 NOTE — Patient Instructions (Signed)
Appt with General Surgery Today @ 130.  Take care  Dr. Adriana Simas

## 2021-04-29 NOTE — Assessment & Plan Note (Addendum)
Recurrent abscess.  Patient has just finished antibiotic therapy.  Now having reoccurrence.  General surgery to see the patient today.  I appreciate their availability in such a quick fashion.  Needs incision and drainage.  Will likely need imaging as well.

## 2021-05-04 LAB — WOUND CULTURE
MICRO NUMBER:: 13054651
RESULT:: NO GROWTH
SPECIMEN QUALITY:: ADEQUATE

## 2021-05-05 ENCOUNTER — Encounter: Payer: Self-pay | Admitting: Surgery

## 2021-05-05 ENCOUNTER — Ambulatory Visit (INDEPENDENT_AMBULATORY_CARE_PROVIDER_SITE_OTHER): Payer: 59 | Admitting: Surgery

## 2021-05-05 ENCOUNTER — Other Ambulatory Visit: Payer: Self-pay

## 2021-05-05 VITALS — BP 141/96 | HR 93 | Temp 98.3°F | Resp 16 | Ht 64.5 in | Wt 218.0 lb

## 2021-05-05 DIAGNOSIS — L02214 Cutaneous abscess of groin: Secondary | ICD-10-CM

## 2021-05-05 DIAGNOSIS — Z09 Encounter for follow-up examination after completed treatment for conditions other than malignant neoplasm: Secondary | ICD-10-CM

## 2021-05-05 MED ORDER — SULFAMETHOXAZOLE-TRIMETHOPRIM 400-80 MG PO TABS: 1.0000 | ORAL_TABLET | Freq: Two times a day (BID) | ORAL | 0 refills | Status: AC

## 2021-05-05 NOTE — Patient Instructions (Signed)
Call next week when back from trip as needed for left sided boil ?

## 2021-05-05 NOTE — Progress Notes (Signed)
[facility] Note  ? ?HPI:  ?31 y.o. Female presents to clinic for follow-up after I&D of right groin abscess.  She states that she was packing the wound for about 2 days after I&D, and she was concerned that the area might have closed up.  She noted some white thickness at the edge of her incision, but she was unsure what this was.  She has noted minimal drainage coming from the area.  She also states that she is got a new fullness in her left groin but denies any tenderness associated with this area.  She denies any fevers or chills.  She is still taking her Bactrim. ? ?Review of Systems:  ?All other review of systems: otherwise negative  ? ?Vital Signs:  ?BP (!) 141/96   Pulse 93   Temp 98.3 ?F (36.8 ?C) (Other (Comment))   Resp 16   Ht 5' 4.5" (1.638 m)   Wt 218 lb (98.9 kg)   SpO2 95%   BMI 36.84 kg/m?   ? ?Physical Exam:  ?Physical Exam ?Vitals reviewed.  ?Constitutional:   ?   Appearance: Normal appearance.  ?Cardiovascular:  ?   Rate and Rhythm: Normal rate.  ?Skin: ?   General: Skin is warm and dry.  ?   Comments: Right groin I&D site significantly improved with improved induration and erythema, less tender to palpation, 1 inch area of incision open with a small amount of fibrinous exudate noted at the inferior aspect of the incision; left groin with induration and a small area of fluctuance, nontender  ?Neurological:  ?   Mental Status: She is alert.  ? ?Laboratory studies: None ? ?Imaging:  ?None ? ?Right groin abscess culture: ? ?GRAM STAIN: Gram positive cocci in chains   ?Comment: Many White blood cells seen Few epithelial cells Moderate Gram positive cocci in chains Few Gram negative bacilli  ? ? ?Assessment:  ?31 y.o. yo Female who presents status post I&D of right groin abscess on 2/23. ? ?Plan:  ?-Explained that the right abscess site is looking good and will continue to improve ?-In regards to the left groin, I explained that we could either open this area up to allow it  to drain as we did her right groin abscess, or she could apply warm compresses to the area to see if it drains on its own.  Patient states that she is going on a trip to [place] on Monday, and she does not want to have another open incision in her groin, she is concerned about cleaning the areas while she is there.  I explained that she should try to keep her open incision covered is much as possible when bathing, but it would be okay if the area does get wet ?-Gave an additional 7 days of Bactrim to cover while the patient was in [place] ?-Patient will call when she is back from [place] to schedule follow-up if her left groin has not drained or is progressing ? ?All of the above recommendations were discussed with the patient, and all of patient's questions were answered to her expressed satisfaction. ? ?Theophilus Kinds, DO ?[facility] ?[address] ?[place], [place] [address] ?[phone] (office) ? ? ?

## 2021-05-09 ENCOUNTER — Other Ambulatory Visit: Payer: Self-pay | Admitting: Family Medicine

## 2021-05-09 DIAGNOSIS — E119 Type 2 diabetes mellitus without complications: Secondary | ICD-10-CM

## 2021-05-11 ENCOUNTER — Ambulatory Visit: Payer: 59 | Admitting: General Surgery

## 2021-06-09 ENCOUNTER — Ambulatory Visit: Payer: Self-pay | Admitting: General Surgery

## 2021-06-14 ENCOUNTER — Ambulatory Visit: Payer: 59 | Admitting: Nurse Practitioner

## 2021-06-14 ENCOUNTER — Encounter: Payer: 59 | Attending: Nurse Practitioner | Admitting: Nutrition

## 2021-06-14 ENCOUNTER — Encounter: Payer: Self-pay | Admitting: Nurse Practitioner

## 2021-06-14 VITALS — BP 122/86 | HR 88 | Ht 64.5 in | Wt 223.8 lb

## 2021-06-14 DIAGNOSIS — E119 Type 2 diabetes mellitus without complications: Secondary | ICD-10-CM

## 2021-06-14 DIAGNOSIS — I10 Essential (primary) hypertension: Secondary | ICD-10-CM

## 2021-06-14 DIAGNOSIS — E1169 Type 2 diabetes mellitus with other specified complication: Secondary | ICD-10-CM

## 2021-06-14 DIAGNOSIS — Z789 Other specified health status: Secondary | ICD-10-CM

## 2021-06-14 DIAGNOSIS — Z794 Long term (current) use of insulin: Secondary | ICD-10-CM

## 2021-06-14 DIAGNOSIS — Z713 Dietary counseling and surveillance: Secondary | ICD-10-CM | POA: Insufficient documentation

## 2021-06-14 DIAGNOSIS — E282 Polycystic ovarian syndrome: Secondary | ICD-10-CM

## 2021-06-14 DIAGNOSIS — E785 Hyperlipidemia, unspecified: Secondary | ICD-10-CM

## 2021-06-14 DIAGNOSIS — F1090 Alcohol use, unspecified, uncomplicated: Secondary | ICD-10-CM

## 2021-06-14 LAB — POCT GLYCOSYLATED HEMOGLOBIN (HGB A1C): HbA1c, POC (controlled diabetic range): 9 % — AB (ref 0.0–7.0)

## 2021-06-14 MED ORDER — SEMAGLUTIDE (2 MG/DOSE) 8 MG/3ML ~~LOC~~ SOPN: 2.0000 mg | PEN_INJECTOR | SUBCUTANEOUS | 3 refills | Status: DC

## 2021-06-14 MED ORDER — METFORMIN HCL 500 MG PO TABS: ORAL_TABLET | ORAL | 3 refills | Status: DC

## 2021-06-14 MED ORDER — TOUJEO MAX SOLOSTAR 300 UNIT/ML ~~LOC~~ SOPN: 100.0000 [IU] | PEN_INJECTOR | Freq: Every evening | SUBCUTANEOUS | 3 refills | Status: DC

## 2021-06-14 MED ORDER — FIASP FLEXTOUCH 100 UNIT/ML ~~LOC~~ SOPN: 18.0000 [IU] | PEN_INJECTOR | Freq: Three times a day (TID) | SUBCUTANEOUS | 3 refills | Status: DC

## 2021-06-14 MED ORDER — FREESTYLE LIBRE 3 SENSOR MISC: 1.0000 "application " | 6 refills | Status: DC

## 2021-06-14 MED ORDER — ATORVASTATIN CALCIUM 10 MG PO TABS: ORAL_TABLET | ORAL | 1 refills | Status: DC

## 2021-06-14 MED ORDER — AMLODIPINE BESYLATE 5 MG PO TABS: ORAL_TABLET | ORAL | 1 refills | Status: DC

## 2021-06-14 MED ORDER — GLIPIZIDE ER 10 MG PO TB24: 10.0000 mg | ORAL_TABLET | Freq: Every morning | ORAL | 3 refills | Status: DC

## 2021-06-14 MED ORDER — BD PEN NEEDLE NANO 2ND GEN 32G X 4 MM MISC: 3 refills | Status: DC

## 2021-06-14 MED ORDER — FIASP FLEXTOUCH 100 UNIT/ML ~~LOC~~ SOPN: 22.0000 [IU] | PEN_INJECTOR | Freq: Three times a day (TID) | SUBCUTANEOUS | 3 refills | Status: DC

## 2021-06-14 NOTE — Progress Notes (Signed)
? ?                                                  Endocrinology Follow Up Visit  ?     06/14/2021, 11:50 AM ? ? ?Subjective:  ? ? Patient ID: Melanie Mccoy, female    DOB: 1991/02/15.  ?Melanie Mccoy is being seen in follow up after being seen in consultation for management of currently uncontrolled symptomatic diabetes requested by  Coral Spikes, DO. ? ? ?Past Medical History:  ?Diagnosis Date  ? Anemia   ? slightly anemic, iron supplements every 2-3 days  ? Bartholin cyst   ? Chlamydia 01/2011  ? Diabetes mellitus without complication ([place])   ? Family history of anesthesia complication   ? grandmother had spinal with nerve damage  ? No pertinent past medical history   ? ? ?Past Surgical History:  ?Procedure Laterality Date  ? CESAREAN SECTION N/A 11/22/2012  ? Procedure: PRIMARY CESAREAN SECTION;  Surgeon: Alwyn Pea, MD;  Location: [facility];  Service: Obstetrics;  Laterality: N/A;  ? CYST REMOVAL TRUNK  2005  ? ? ?Social History  ? ?Socioeconomic History  ? Marital status: Single  ?  Spouse name: Not on file  ? Number of children: Not on file  ? Years of education: Not on file  ? Highest education level: Not on file  ?Occupational History  ? Not on file  ?Tobacco Use  ? Smoking status: Never  ? Smokeless tobacco: Never  ?Vaping Use  ? Vaping Use: Never used  ?Substance and Sexual Activity  ? Alcohol use: No  ?  Comment: Occassionally  ? Drug use: No  ? Sexual activity: Yes  ?Other Topics Concern  ? Not on file  ?Social History Narrative  ? Not on file  ? ?Social Determinants of Health  ? ?Financial Resource Strain: Not on file  ?Food Insecurity: Not on file  ?Transportation Needs: Not on file  ?Physical Activity: Not on file  ?Stress: Not on file  ?Social Connections: Not on file  ? ? ?Family History  ?Problem Relation Age of Onset  ? Cancer Other   ? Heart disease Other   ? Diabetes Other   ? Diabetes Mother   ? Diabetes Father   ? Diabetes Maternal Grandfather   ? ? ?Outpatient  Encounter Medications as of 06/14/2021  ?Medication Sig  ? Continuous Blood Gluc Sensor (FREESTYLE LIBRE 3 SENSOR) MISC 1 application. by Does not apply route every 14 (fourteen) days. Place 1 sensor on the skin every 14 days. Use to check glucose continuously  ? glucose blood test strip Use as instructed  ? Lancets (ACCU-CHEK SOFT TOUCH) lancets Use as instructed  ? lisinopril (ZESTRIL) 10 MG tablet TAKE 1 TABLET(10 MG) BY MOUTH DAILY  ? norgestimate-ethinyl estradiol (MILI) 0.25-35 MG-MCG tablet Take 1 tablet by mouth daily.  ? Semaglutide, 2 MG/DOSE, 8 MG/3ML SOPN Inject 2 mg as directed once a week.  ? [DISCONTINUED] amLODipine (NORVASC) 5 MG tablet TAKE 1 TABLET(5 MG) BY MOUTH IN THE MORNING  ? [DISCONTINUED] atorvastatin (LIPITOR) 10 MG tablet TAKE 1 TABLET(10 MG) BY MOUTH DAILY  ? [DISCONTINUED] Continuous Blood Gluc Sensor (FREESTYLE LIBRE 3 SENSOR) MISC 1 application by Does not apply route every 14 (fourteen) days. Place 1 sensor on the skin every 14 days.  Use to check glucose continuously  ? [DISCONTINUED] Dulaglutide (TRULICITY) 4.5 0000000 SOPN Inject 4.5 mg as directed once a week.  ? [DISCONTINUED] glipiZIDE (GLUCOTROL XL) 10 MG 24 hr tablet TAKE 1 TABLET BY MOUTH EVERY MORNING  ? [DISCONTINUED] insulin aspart (FIASP FLEXTOUCH) 100 UNIT/ML FlexTouch Pen Inject 18-24 Units into the skin 3 (three) times daily before meals.  ? [DISCONTINUED] insulin glargine, 2 Unit Dial, (TOUJEO MAX SOLOSTAR) 300 UNIT/ML Solostar Pen Inject 100 Units into the skin at bedtime.  ? [DISCONTINUED] Insulin Pen Needle (BD PEN NEEDLE NANO 2ND GEN) 32G X 4 MM MISC Use to inject insulin 5 times daily.  ? [DISCONTINUED] metFORMIN (GLUCOPHAGE) 500 MG tablet TAKE 1 TABLET(500 MG) BY MOUTH TWICE DAILY WITH A MEAL  ? amLODipine (NORVASC) 5 MG tablet TAKE 1 TABLET(5 MG) BY MOUTH IN THE MORNING  ? atorvastatin (LIPITOR) 10 MG tablet TAKE 1 TABLET(10 MG) BY MOUTH DAILY  ? fluconazole (DIFLUCAN) 150 MG tablet Take 2 tablets (300 mg  total) by mouth once a week. For 2 weeks. (Patient not taking: Reported on 06/14/2021)  ? glipiZIDE (GLUCOTROL XL) 10 MG 24 hr tablet Take 1 tablet (10 mg total) by mouth every morning.  ? insulin aspart (FIASP FLEXTOUCH) 100 UNIT/ML FlexTouch Pen Inject 22-28 Units into the skin 3 (three) times daily before meals.  ? insulin glargine, 2 Unit Dial, (TOUJEO MAX SOLOSTAR) 300 UNIT/ML Solostar Pen Inject 100 Units into the skin at bedtime.  ? Insulin Pen Needle (BD PEN NEEDLE NANO 2ND GEN) 32G X 4 MM MISC Use to inject insulin 6 times daily.  ? ketorolac (TORADOL) 10 MG tablet Take 1 tablet (10 mg total) by mouth every 6 (six) hours as needed for moderate pain or severe pain. (Patient not taking: Reported on 06/14/2021)  ? metFORMIN (GLUCOPHAGE) 500 MG tablet TAKE 1 TABLET(500 MG) BY MOUTH TWICE DAILY WITH A MEAL  ? oxyCODONE (ROXICODONE) 5 MG immediate release tablet Take 1 tablet (5 mg total) by mouth every 6 (six) hours as needed for severe pain. (Patient not taking: Reported on 06/14/2021)  ? traMADol (ULTRAM) 50 MG tablet Take 1 tablet (50 mg total) by mouth every 12 (twelve) hours as needed. (Patient not taking: Reported on 06/14/2021)  ? [DISCONTINUED] insulin aspart (FIASP FLEXTOUCH) 100 UNIT/ML FlexTouch Pen Inject 18-24 Units into the skin 3 (three) times daily before meals.  ? ?No facility-administered encounter medications on file as of 06/14/2021.  ? ? ?ALLERGIES: ?Allergies  ?Allergen Reactions  ? Bee Venom Swelling  ?  Progressively worse with each sting ?Other reaction(s): Other  ? Nickel Rash and Itching  ? Ibuprofen Hives  ? ? ?VACCINATION STATUS: ?Immunization History  ?Administered Date(s) Administered  ? Tdap 06/26/2006  ? ? ?Diabetes ?She presents for her follow-up diabetic visit. She has type 2 diabetes mellitus. Onset time: She was diagnosed at approx age of 44. Her disease course has been improving. There are no hypoglycemic associated symptoms. Associated symptoms include fatigue. Pertinent  negatives for diabetes include no blurred vision, no polydipsia, no polyphagia, no polyuria and no weight loss. There are no hypoglycemic complications. Symptoms are stable. There are no diabetic complications. Risk factors for coronary artery disease include diabetes mellitus, dyslipidemia, hypertension, obesity, sedentary lifestyle, family history and stress. Current diabetic treatment includes oral agent (dual therapy) and intensive insulin program (and trulicity). She is compliant with treatment most of the time. Her weight is increasing steadily. She is following a generally unhealthy diet. When asked about meal planning, she  reported none. She has had a previous visit with a dietitian. She participates in exercise intermittently. Her home blood glucose trend is decreasing steadily. Her breakfast blood glucose range is generally 180-200 mg/dl. Her lunch blood glucose range is generally >200 mg/dl. Her dinner blood glucose range is generally >200 mg/dl. Her bedtime blood glucose range is generally >200 mg/dl. Her overall blood glucose range is >200 mg/dl. (She presents today with her CGM showing above target fasting and postprandial glycemic profile overall.   Her A1c today is 9%, improving from last visit of 11.6%.  Analysis of her CGM shows TIR 26%, TAR 74%, TBR 0% with a GMI of 8.8%.  She has started working out much more and can see a difference in her glucose as a result.  She is wondering if the Trulicity is just not effective for her and would like to try other options.) An ACE inhibitor/angiotensin II receptor blocker is being taken. She does not see a podiatrist.Eye exam is current.  ?Hypertension ?This is a chronic problem. The current episode started more than 1 year ago. The problem has been gradually improving since onset. The problem is controlled. Pertinent negatives include no blurred vision. There are no associated agents to hypertension. Risk factors for coronary artery disease include diabetes  mellitus, dyslipidemia, obesity, sedentary lifestyle and stress. Past treatments include calcium channel blockers and ACE inhibitors. The current treatment provides mild improvement. Compliance problems include

## 2021-06-14 NOTE — Progress Notes (Signed)
Medical Nutrition Therapy DM  Follow up 1015  1030 ?Marland Kitchen Scheduled to see [name],FNP At [facility] today also. ?NUTRITION ASSESSMENT  ?Changes made:  ?Has been walking a lot- 3-4 times per week. 2 days playing soccer and other outside actvities, Taking stairs instead of elevator. Feels better. ?A1C down from 11% down to 9% ?Medications: Was on Trulicity and changed to Ozempic. ?Has cut down on drinking. ?LIbre: Glucose Statistics ?Average Glucose 200 mg/dL ?Goal: ?154 mg/dL ?Glucose Management Indicator (GMI) ?Approximate A1C level based on average CGM glucose level. ?8.1% Goal: ?7.0%   ?Time in Ranges ??Very High? ??High? ??181 - 250 ?39% ??Target? ??70 - 180 ?43% ??>70%?  ??Low? ?54 - 69 ?0% ?Very Low?  ??Very High? ?>250 mg/dL ? 18% ? ? ? ?She got a treadmill and is ready to start working on exercising. ?BS reading from her Josephine Igo continue to show elevated BS in the evening. Tends to skip breakfast and eats lunch and dinner only. ?Drinking more water. Trying to buy healthier foods. ?Eating late at night may be effecting her BS at night. ?To see [name] FNP at [facility] today. ?TIR 32%. TAR 68% AVG BS 223 mg/dl. TIR has improved slightly. ? ?Lab Results  ?Component Value Date  ? HGBA1C 9.0 (A) 06/14/2021  ? ?Has been testing 4 times per day. Has been working on eating meals on time. ? ?Referral diagnosis: e11.8, e78.0 ?Preferred learning style:  no preference indicated ?Learning readiness: change in progress ? ?Anthropometrics  ?Wt Readings from Last 3 Encounters:  ?06/14/21 223 lb 12.8 oz (101.5 kg)  ?05/05/21 218 lb (98.9 kg)  ?04/29/21 223 lb (101.2 kg)  ? ?Ht Readings from Last 3 Encounters:  ?06/14/21 5' 4.5" (1.638 m)  ?05/05/21 5' 4.5" (1.638 m)  ?04/29/21 5' 4.5" (1.638 m)  ? ?There is no height or weight on file to calculate BMI. ?@BMIFA @ ?Facility age limit for growth percentiles is 20 years. ?Facility age limit for growth percentiles is 20 years. ?Clinical ?Medical Hx: HTN and  Hyperlipidemai ?Medications: Toujeo 100, Humalog 18 units with meals, Trulicity weekly, metformin 500 BID and Glipizide 10 mg ?Labs: FBS: 200-300's. ? ?Lab Results  ?Component Value Date  ? HGBA1C 9.0 (A) 06/14/2021  ? ? ?  Latest Ref Rng & Units 12/31/2020  ?  8:06 AM 09/25/2020  ? 10:16 AM 01/29/2020  ?  9:26 AM  ?CMP  ?Glucose 70 - 99 mg/dL 01/31/2020   811   572    ?BUN 6 - 20 mg/dL 13   14   8     ?Creatinine 0.57 - 1.00 mg/dL 620     3.55    ?Sodium 134 - 144 mmol/L 136   135   139    ?Potassium 3.5 - 5.2 mmol/L 3.9   4.5   3.8    ?Chloride 96 - 106 mmol/L 97   97   99    ?CO2 20 - 29 mmol/L 22   21   22     ?Calcium 8.7 - 10.2 mg/dL 9.3   9.5   9.4    ?Total Protein 6.0 - 8.5 g/dL 6.7   6.7   7.0    ?Total Bilirubin 0.0 - 1.2 mg/dL 9.74   0.4   0.2    ?Alkaline Phos 44 - 121 IU/L 82   73   77    ?AST 0 - 40 IU/L 19   26   25     ?ALT 0 - 32 IU/L 26  26   49    ? ?Lipid Panel  ?   ?Component Value Date/Time  ? CHOL 132 12/31/2020 0806  ? TRIG 205 (H) 12/31/2020 0806  ? HDL 35 (L) 12/31/2020 0806  ? CHOLHDL 3.8 12/31/2020 0806  ? CHOLHDL 5.1 07/10/2019 0506  ? VLDL 32 07/10/2019 0506  ? LDLCALC 63 12/31/2020 0806  ? LABVLDL 34 12/31/2020 0806  ? ?Notable Signs/Symptoms: Fatigue, increased thirst, frequent urination, blurry vision ? ?Lifestyle & Dietary Hx ?She works 1 job now and stress is getting better.  ?Estimated daily fluid intake: 100 oz ?Supplements: none ?Sleep: 4-7 hrs  ?Stress / self-care: jobs are stressing ?Current average weekly physical activity: 2 hours ? ? ? Estimated Energy Needs ?Calories: 1200-1400 ?Carbohydrate: 135g ?Protein: 90g ?Fat: 33g ? ? ?NUTRITION DIAGNOSIS  ?NB-1.1 Food and nutrition-related knowledge deficit As related to Diabetes Type 2.  As evidenced by A1C 10.4%. ? ?NUTRITION INTERVENTION  ?Meal planning, prevention of complications of DM. How insulin works and benefits of short acting insulin.  ? ?Reviewed emotional eating, need to focus on more nutrient dense food choices and not  skipping meals to lose weight.  ? ?Lifestyle Medicine ? ?- Whole Food, Plant Predominant Nutrition is highly recommended: Eat Plenty of vegetables, Mushrooms, fruits, Legumes, Whole Grains, Nuts, seeds in lieu of processed meats, processed snacks/pastries red meat, poultry, eggs.  ?  ?-It is better to avoid simple carbohydrates including: Cakes, Sweet Desserts, Ice Cream, Soda (diet and regular), Sweet Tea, Candies, Chips, Cookies, Store Bought Juices, Alcohol in Excess of  1-2 drinks a day, Lemonade,  Artificial Sweeteners, Doughnuts, Coffee Creamers, "Sugar-free" Products, etc, etc.  This is not a complete list..... ? ?Exercise: If you are able: 30 -60 minutes a day ,4 days a week, or 150 minutes a week.  The longer the better.  Combine stretch, strength, and aerobic activities.  If you were told in the past that you have high risk for cardiovascular diseases, you may seek evaluation by your heart doctor prior to initiating moderate to intense exercise programs.  ? ?Breakfast: skipped ?Lunch: healthy veggies at [employer], water ?Dinner: steak corn, turnip greens,biscuits, water ?  ? ? ?Handouts Provided Include  ?Emotional eating ? ?Learning Style & Readiness for Change ?Teaching method utilized: Visual & Auditory  ?Demonstrated degree of understanding via: Teach Back  ?Barriers to learning/adherence to lifestyle change: None ? ?Goals Established by Pt ? ? ?Increase low carb vegetables ?Drink 5 -6 bottles of water ?Keep exercise to 150 minutes a week. ?Get A1C down to 7% ?Increase meal time insulin to 22 units plus sliding scale ?Take Ozemipic weekly. ? ? ? ? ?MONITORING & EVALUATION ?Dietary intake, weekly physical activity, and blood sugars  in 3 month. ?  ?Next Steps  ?Will continue to work on meal planning. ? ?

## 2021-06-14 NOTE — Patient Instructions (Signed)
Goals ? ?Increase low carb vegetables ?Drink 5 -6 bottles of water ?Keep exercise to 150 minutes a week. ?Get A1C down to 7% ?Increase meal time insulin to 22 units plus sliding scale ?Take Ozemipic weekly. ? ?

## 2021-06-14 NOTE — Patient Instructions (Signed)

## 2021-06-16 ENCOUNTER — Ambulatory Visit: Payer: 59 | Admitting: Nutrition

## 2021-06-21 ENCOUNTER — Ambulatory Visit: Payer: 59 | Admitting: Nurse Practitioner

## 2021-07-14 ENCOUNTER — Other Ambulatory Visit: Payer: Self-pay | Admitting: Family Medicine

## 2021-07-19 ENCOUNTER — Encounter: Payer: Self-pay | Admitting: Nutrition

## 2021-09-13 ENCOUNTER — Encounter: Payer: 59 | Attending: Family Medicine | Admitting: Nutrition

## 2021-09-13 ENCOUNTER — Other Ambulatory Visit: Payer: Self-pay

## 2021-09-13 ENCOUNTER — Encounter: Payer: Self-pay | Admitting: Nutrition

## 2021-09-13 ENCOUNTER — Ambulatory Visit: Payer: 59 | Admitting: Nurse Practitioner

## 2021-09-13 ENCOUNTER — Encounter: Payer: Self-pay | Admitting: Nurse Practitioner

## 2021-09-13 VITALS — Ht 65.0 in | Wt 213.8 lb

## 2021-09-13 VITALS — BP 131/81 | HR 93 | Ht 65.5 in | Wt 213.0 lb

## 2021-09-13 DIAGNOSIS — E119 Type 2 diabetes mellitus without complications: Secondary | ICD-10-CM

## 2021-09-13 DIAGNOSIS — E785 Hyperlipidemia, unspecified: Secondary | ICD-10-CM

## 2021-09-13 DIAGNOSIS — Z713 Dietary counseling and surveillance: Secondary | ICD-10-CM | POA: Insufficient documentation

## 2021-09-13 DIAGNOSIS — Z794 Long term (current) use of insulin: Secondary | ICD-10-CM

## 2021-09-13 DIAGNOSIS — E782 Mixed hyperlipidemia: Secondary | ICD-10-CM

## 2021-09-13 DIAGNOSIS — I10 Essential (primary) hypertension: Secondary | ICD-10-CM

## 2021-09-13 DIAGNOSIS — E282 Polycystic ovarian syndrome: Secondary | ICD-10-CM

## 2021-09-13 DIAGNOSIS — E1169 Type 2 diabetes mellitus with other specified complication: Secondary | ICD-10-CM

## 2021-09-13 LAB — POCT GLYCOSYLATED HEMOGLOBIN (HGB A1C): HbA1c POC (<> result, manual entry): 9.2 % (ref 4.0–5.6)

## 2021-09-13 MED ORDER — TOUJEO MAX SOLOSTAR 300 UNIT/ML ~~LOC~~ SOPN
100.0000 [IU] | PEN_INJECTOR | Freq: Every evening | SUBCUTANEOUS | 3 refills | Status: DC
Start: 2021-09-13 — End: 2021-09-13

## 2021-09-13 MED ORDER — METFORMIN HCL ER 500 MG PO TB24: 1000.0000 mg | ORAL_TABLET | Freq: Every day | ORAL | 3 refills | Status: DC

## 2021-09-13 MED ORDER — LISINOPRIL 10 MG PO TABS: 10.0000 mg | ORAL_TABLET | Freq: Every day | ORAL | 3 refills | Status: DC

## 2021-09-13 MED ORDER — FIASP FLEXTOUCH 100 UNIT/ML ~~LOC~~ SOPN: 22.0000 [IU] | PEN_INJECTOR | Freq: Three times a day (TID) | SUBCUTANEOUS | 3 refills | Status: DC

## 2021-09-13 MED ORDER — TOUJEO MAX SOLOSTAR 300 UNIT/ML ~~LOC~~ SOPN: 100.0000 [IU] | PEN_INJECTOR | Freq: Every evening | SUBCUTANEOUS | 2 refills | Status: DC

## 2021-09-13 MED ORDER — ATORVASTATIN CALCIUM 10 MG PO TABS: ORAL_TABLET | ORAL | 3 refills | Status: DC

## 2021-09-13 MED ORDER — SEMAGLUTIDE (2 MG/DOSE) 8 MG/3ML ~~LOC~~ SOPN
2.0000 mg | PEN_INJECTOR | SUBCUTANEOUS | 3 refills | Status: DC
Start: 2021-09-13 — End: 2022-01-07

## 2021-09-13 MED ORDER — BD PEN NEEDLE NANO 2ND GEN 32G X 4 MM MISC: 3 refills | Status: DC

## 2021-09-13 NOTE — Progress Notes (Signed)
Medical Nutrition Therapy DM  Follow up 0950 1020 . Scheduled to see Melanie Reardron,FNP At [facility] today also. NUTRITION ASSESSMENT   This visit was completed via telephone due to the COVID-19 pandemic.   I spoke with Melanie Mccoy and verified that I was speaking with the correct person with two patient identifiers (full name and date of birth).   I discussed the limitations related to this kind of visit and the patient is willing to proceed.  Changes made: walking. Lost 13 lbs since last visit.  Not many food choice changes. She has had a lot of stress in her life with family issues recently. Doesn't like a lot of vegetables. Doesn't meal prep and end up skipping meals or eating on the run of fast foods or processed foods. A1C 9.2% . CGM shows improved blood sugars most recently according to her GGM.Marland Kitchen She tends to do [employer] at times or skips meals. Diet is low in fresh fruits, vegetables and whole grains. Saw Melanie Mccoy today.  Walking before lunch daily. Food choices: haven't been able to make many changes. Medications: metformin, Lantus 100 units at betdime and Fiasp 22 units with meals plus sliding scale. On  Ozempic. Has quit drinking.  Needs work on meal planning and food prepping.  Lab Results  Component Value Date   HGBA1C 9.2 09/13/2021  .  Referral diagnosis: e11.8, e78.0 Preferred learning style:  no preference indicated Learning readiness: change in progress  Anthropometrics  Wt Readings from Last 3 Encounters:  09/13/21 213 lb (96.6 kg)  09/13/21 213 lb 12.8 oz (97 kg)  06/14/21 223 lb 12.8 oz (101.5 kg)   Ht Readings from Last 3 Encounters:  09/13/21 5' 5.5" (1.664 m)  06/14/21 5' 4.5" (1.638 m)  05/05/21 5' 4.5" (1.638 m)   Body mass index is 35.04 kg/m. @BMIFA @ Facility age limit for growth %iles is 20 years. Facility age limit for growth %iles is 20 years. Clinical Medical Hx: HTN and Hyperlipidemai Medications: Toujeo 100,  Humalog 18 units with meals, Trulicity weekly, metformin 500 BID and Glipizide 10 mg Labs: FBS: 200-300's.  Lab Results  Component Value Date   HGBA1C 9.2 09/13/2021      Latest Ref Rng & Units 12/31/2020    8:06 AM 09/25/2020   10:16 AM 01/29/2020    9:26 AM  CMP  Glucose 70 - 99 mg/dL 01/31/2020  829  937   BUN 6 - 20 mg/dL 13  14  8    Creatinine 0.57 - 1.00 mg/dL 169   6.78   Sodium 134 - 144 mmol/L 136  135  139   Potassium 3.5 - 5.2 mmol/L 3.9  4.5  3.8   Chloride 96 - 106 mmol/L 97  97  99   CO2 20 - 29 mmol/L 22  21  22    Calcium 8.7 - 10.2 mg/dL 9.3  9.5  9.4   Total Protein 6.0 - 8.5 g/dL 6.7  6.7  7.0   Total Bilirubin 0.0 - 1.2 mg/dL 9.38  0.4  0.2   Alkaline Phos 44 - 121 IU/L 82  73  77   AST 0 - 40 IU/L 19  26  25    ALT 0 - 32 IU/L 26  26  49    Lipid Panel     Component Value Date/Time   CHOL 132 12/31/2020 0806   TRIG 205 (H) 12/31/2020 0806   HDL 35 (L) 12/31/2020 0806   CHOLHDL 3.8 12/31/2020 0806  CHOLHDL 5.1 07/10/2019 0506   VLDL 32 07/10/2019 0506   LDLCALC 63 12/31/2020 0806   LABVLDL 34 12/31/2020 0806   Notable Signs/Symptoms: Fatigue, increased thirst, frequent urination, blurry vision  Lifestyle & Dietary Hx She works 1 job now and stress is getting better.  Estimated daily fluid intake: 100 oz Supplements: none Sleep: 4-7 hrs  Stress / self-care: jobs are stressing Current average weekly physical activity: 2 hours    Estimated Energy Needs Calories: 1200-1400 Carbohydrate: 135g Protein: 90g Fat: 33g   NUTRITION DIAGNOSIS  NB-1.1 Food and nutrition-related knowledge deficit As related to Diabetes Type 2.  As evidenced by A1C 10.4%.  NUTRITION INTERVENTION  Meal planning, prevention of complications of DM. How insulin works and benefits of short acting insulin.   Reviewed emotional eating, need to focus on more nutrient dense food choices and not skipping meals to lose weight.   Lifestyle Medicine  - Whole Food, Plant  Predominant Nutrition is highly recommended: Eat Plenty of vegetables, Mushrooms, fruits, Legumes, Whole Grains, Nuts, seeds in lieu of processed meats, processed snacks/pastries red meat, poultry, eggs.    -It is better to avoid simple carbohydrates including: Cakes, Sweet Desserts, Ice Cream, Soda (diet and regular), Sweet Tea, Candies, Chips, Cookies, Store Bought Juices, Alcohol in Excess of  1-2 drinks a day, Lemonade,  Artificial Sweeteners, Doughnuts, Coffee Creamers, "Sugar-free" Products, etc, etc.  This is not a complete list.....  Exercise: If you are able: 30 -60 minutes a day ,4 days a week, or 150 minutes a week.  The longer the better.  Combine stretch, strength, and aerobic activities.  If you were told in the past that you have high risk for cardiovascular diseases, you may seek evaluation by your heart doctor prior to initiating moderate to intense exercise programs.   Breakfast: Location manager breakfast crissant, or adkins protein shake. Lunch: oradic,  Dinner: Rice lthy veggies at [employer], Firefighter: [employer], turnip greens,biscuits, water   5 bottles per day of water  Handouts Provided Include  Emotional eating  Learning Style & Readiness for Change Teaching method utilized: Visual & Auditory  Demonstrated degree of understanding via: Teach Back  Barriers to learning/adherence to lifestyle change: None  Goals Established by Pt   Increase low carb vegetables Drink 5 -6 bottles of water Keep exercise to 150 minutes a week. Get A1C down to 7% Increase meal time insulin to 22 units plus sliding scale Take Ozemipic weekly.     MONITORING & EVALUATION Dietary intake, weekly physical activity, and blood sugars  in 3 month.   Next Steps  Will continue to work on meal planning.

## 2021-09-13 NOTE — Patient Instructions (Addendum)
Goal  Choose protein shake and fruit or yogurt for breakfast. Stop the  [employer]. Work on [employer] walking daily daily. Get A1C down to 8%. Increase low carb vegetables and fruit with meals. Try using [employer] to log foods in and track food choices.

## 2021-09-13 NOTE — Progress Notes (Signed)
Endocrinology Follow Up Visit       09/13/2021, 9:16 AM   Subjective:    Patient ID: Melanie Mccoy, female    DOB: [birth year].  Melanie Mccoy is being seen in follow up after being seen in consultation for management of currently uncontrolled symptomatic diabetes requested by  Tommie Sams, DO.   Past Medical History:  Diagnosis Date   Anemia    slightly anemic, iron supplements every 2-3 days   Bartholin cyst    Chlamydia 01/2011   Diabetes mellitus without complication (HCC)    Family history of anesthesia complication    grandmother had spinal with nerve damage   No pertinent past medical history     Past Surgical History:  Procedure Laterality Date   CESAREAN SECTION N/A 11/22/2012   Procedure: PRIMARY CESAREAN SECTION;  Surgeon: Esmeralda Arthur, MD;  Location: [facility];  Service: Obstetrics;  Laterality: N/A;   CYST REMOVAL TRUNK  2005    Social History   Socioeconomic History   Marital status: Single    Spouse name: Not on file   Number of children: Not on file   Years of education: Not on file   Highest education level: Not on file  Occupational History   Not on file  Tobacco Use   Smoking status: Never   Smokeless tobacco: Never  Vaping Use   Vaping Use: Never used  Substance and Sexual Activity   Alcohol use: No    Comment: Occassionally   Drug use: No   Sexual activity: Yes  Other Topics Concern   Not on file  Social History Narrative   Not on file   Social Determinants of Health   Financial Resource Strain: Not on file  Food Insecurity: Not on file  Transportation Needs: Not on file  Physical Activity: Not on file  Stress: Not on file  Social Connections: Not on file    Family History  Problem Relation Age of Onset   Cancer Other    Heart disease Other    Diabetes Other    Diabetes Mother    Diabetes Father    Diabetes Maternal Grandfather     Outpatient  Encounter Medications as of 09/13/2021  Medication Sig   metFORMIN (GLUCOPHAGE-XR) 500 MG 24 hr tablet Take 2 tablets (1,000 mg total) by mouth daily with supper.   amLODipine (NORVASC) 5 MG tablet TAKE 1 TABLET(5 MG) BY MOUTH IN THE MORNING   atorvastatin (LIPITOR) 10 MG tablet TAKE 1 TABLET(10 MG) BY MOUTH DAILY   Continuous Blood Gluc Sensor (FREESTYLE LIBRE 3 SENSOR) MISC 1 application. by Does not apply route every 14 (fourteen) days. Place 1 sensor on the skin every 14 days. Use to check glucose continuously   fluconazole (DIFLUCAN) 150 MG tablet Take 2 tablets (300 mg total) by mouth once a week. For 2 weeks. (Patient not taking: Reported on 06/14/2021)   glucose blood test strip Use as instructed   insulin aspart (FIASP FLEXTOUCH) 100 UNIT/ML FlexTouch Pen Inject 22-28 Units into the skin 3 (three) times daily before meals.   insulin glargine, 2 Unit Dial, (TOUJEO MAX SOLOSTAR) 300 UNIT/ML Solostar Pen Inject 100 Units into the  skin at bedtime.   Insulin Pen Needle (BD PEN NEEDLE NANO 2ND GEN) 32G X 4 MM MISC Use to inject insulin 6 times daily.   ketorolac (TORADOL) 10 MG tablet Take 1 tablet (10 mg total) by mouth every 6 (six) hours as needed for moderate pain or severe pain. (Patient not taking: Reported on 06/14/2021)   Lancets (ACCU-CHEK SOFT TOUCH) lancets Use as instructed   lisinopril (ZESTRIL) 10 MG tablet Take 1 tablet (10 mg total) by mouth daily.   norgestimate-ethinyl estradiol (MILI) 0.25-35 MG-MCG tablet TAKE 1 TABLET BY MOUTH DAILY   Semaglutide, 2 MG/DOSE, 8 MG/3ML SOPN Inject 2 mg as directed once a week.   [DISCONTINUED] atorvastatin (LIPITOR) 10 MG tablet TAKE 1 TABLET(10 MG) BY MOUTH DAILY   [DISCONTINUED] glipiZIDE (GLUCOTROL XL) 10 MG 24 hr tablet Take 1 tablet (10 mg total) by mouth every morning.   [DISCONTINUED] insulin aspart (FIASP FLEXTOUCH) 100 UNIT/ML FlexTouch Pen Inject 22-28 Units into the skin 3 (three) times daily before meals.   [DISCONTINUED] insulin  glargine, 2 Unit Dial, (TOUJEO MAX SOLOSTAR) 300 UNIT/ML Solostar Pen Inject 100 Units into the skin at bedtime.   [DISCONTINUED] Insulin Pen Needle (BD PEN NEEDLE NANO 2ND GEN) 32G X 4 MM MISC Use to inject insulin 6 times daily.   [DISCONTINUED] lisinopril (ZESTRIL) 10 MG tablet TAKE 1 TABLET(10 MG) BY MOUTH DAILY   [DISCONTINUED] metFORMIN (GLUCOPHAGE) 500 MG tablet TAKE 1 TABLET(500 MG) BY MOUTH TWICE DAILY WITH A MEAL   [DISCONTINUED] oxyCODONE (ROXICODONE) 5 MG immediate release tablet Take 1 tablet (5 mg total) by mouth every 6 (six) hours as needed for severe pain. (Patient not taking: Reported on 06/14/2021)   [DISCONTINUED] Semaglutide, 2 MG/DOSE, 8 MG/3ML SOPN Inject 2 mg as directed once a week.   [DISCONTINUED] traMADol (ULTRAM) 50 MG tablet Take 1 tablet (50 mg total) by mouth every 12 (twelve) hours as needed. (Patient not taking: Reported on 06/14/2021)   No facility-administered encounter medications on file as of 09/13/2021.    ALLERGIES: Allergies  Allergen Reactions   Bee Venom Swelling    Progressively worse with each sting Other reaction(s): Other   Nickel Rash and Itching   Ibuprofen Hives    VACCINATION STATUS: Immunization History  Administered Date(s) Administered   Tdap 06/26/2006    Diabetes She presents for her follow-up diabetic visit. She has type 2 diabetes mellitus. Onset time: She was diagnosed at approx age of 86. Her disease course has been fluctuating. There are no hypoglycemic associated symptoms. Associated symptoms include fatigue and weight loss. Pertinent negatives for diabetes include no blurred vision, no polydipsia, no polyphagia and no polyuria. There are no hypoglycemic complications. Symptoms are improving. There are no diabetic complications. Risk factors for coronary artery disease include diabetes mellitus, dyslipidemia, hypertension, obesity, sedentary lifestyle, family history and stress. Current diabetic treatment includes oral agent  (dual therapy) and intensive insulin program (and Ozempic). She is compliant with treatment some of the time (forgets her Metformin and Glipizide routinely). Her weight is decreasing steadily. She is following a generally unhealthy diet. When asked about meal planning, she reported none. She has had a previous visit with a dietitian. She participates in exercise intermittently. Her home blood glucose trend is fluctuating minimally. Her overall blood glucose range is >200 mg/dl. (She presents today with her CGM showing improved yet still above target glycemic profile overall  Her POCT A1c today is 9.2%, increasing from last visit of 9%.  She has been stressed lately  due to family medical issues.  Analysis of her CGM shows TIR 41%, TAR 59%, TBR0% with a GMI of 8.3%.) An ACE inhibitor/angiotensin II receptor blocker is being taken. She does not see a podiatrist.Eye exam is current.  Hypertension This is a chronic problem. The current episode started more than 1 year ago. The problem has been gradually improving since onset. The problem is controlled. Pertinent negatives include no blurred vision. There are no associated agents to hypertension. Risk factors for coronary artery disease include diabetes mellitus, dyslipidemia, obesity, sedentary lifestyle and stress. Past treatments include calcium channel blockers and ACE inhibitors. The current treatment provides mild improvement. Compliance problems include exercise and diet.   Hyperlipidemia This is a chronic problem. The current episode started more than 1 year ago. The problem is uncontrolled. Recent lipid tests were reviewed and are high. Exacerbating diseases include diabetes and obesity. Factors aggravating her hyperlipidemia include fatty foods. Current antihyperlipidemic treatment includes statins. The current treatment provides mild improvement of lipids. Compliance problems include adherence to diet and adherence to exercise.  Risk factors for coronary  artery disease include diabetes mellitus, dyslipidemia, hypertension, obesity, a sedentary lifestyle and stress.    Review of systems  Constitutional: +decreasing body weight,  current Body mass index is 34.91 kg/m. , no fatigue, no subjective hyperthermia, no subjective hypothermia Eyes: no blurry vision, no xerophthalmia ENT: no sore throat, no nodules palpated in throat, no dysphagia/odynophagia, no hoarseness Cardiovascular: no chest pain, no shortness of breath, no palpitations, no leg swelling Respiratory: no cough, no shortness of breath Gastrointestinal: no nausea/vomiting/diarrhea Musculoskeletal: no muscle/joint aches Skin: no rashes, no hyperemia Neurological: no tremors, no numbness, no tingling, no dizziness Psychiatric: no depression, no anxiety, + increased stress levels  Objective:     BP 131/81   Pulse 93   Ht 5' 5.5" (1.664 m)   Wt 213 lb (96.6 kg)   BMI 34.91 kg/m   Wt Readings from Last 3 Encounters:  09/13/21 213 lb (96.6 kg)  06/14/21 223 lb 12.8 oz (101.5 kg)  05/05/21 218 lb (98.9 kg)    BP Readings from Last 3 Encounters:  09/13/21 131/81  06/14/21 122/86  05/05/21 (!) 141/96     Physical Exam- Limited  Constitutional:  Body mass index is 34.91 kg/m. , not in acute distress, normal state of mind Eyes:  EOMI, no exophthalmos Neck: Supple Cardiovascular: RRR, no murmurs, rubs, or gallops, no edema Respiratory: Adequate breathing efforts, no crackles, rales, rhonchi, or wheezing Musculoskeletal: no gross deformities, strength intact in all four extremities, no gross restriction of joint movements Skin:  no rashes, no hyperemia Neurological: no tremor with outstretched hands    CMP ( most recent) CMP     Component Value Date/Time   NA 136 12/31/2020 0806   K 3.9 12/31/2020 0806   CL 97 12/31/2020 0806   CO2 22 12/31/2020 0806   GLUCOSE 222 (H) 12/31/2020 0806   GLUCOSE 318 (H) 07/10/2019 0506   BUN 13 12/31/2020 0806   CREATININE  0.63 12/31/2020 0806   CREATININE 0.77 01/14/2016 1514   CALCIUM 9.3 12/31/2020 0806   PROT 6.7 12/31/2020 0806   ALBUMIN 3.9 12/31/2020 0806   AST 19 12/31/2020 0806   ALT 26 12/31/2020 0806   ALKPHOS 82 12/31/2020 0806   BILITOT <0.2 12/31/2020 0806   GFRNONAA 118 01/29/2020 0926   GFRNONAA >89 01/14/2016 1514   GFRAA 136 01/29/2020 0926   GFRAA >89 01/14/2016 1514     Diabetic Labs (most recent):  Lab Results  Component Value Date   HGBA1C 9.2 09/13/2021   HGBA1C 9.0 (A) 06/14/2021   HGBA1C 11.6 (A) 01/01/2021   MICROALBUR 80 01/01/2021     Lipid Panel ( most recent) Lipid Panel     Component Value Date/Time   CHOL 132 12/31/2020 0806   TRIG 205 (H) 12/31/2020 0806   HDL 35 (L) 12/31/2020 0806   CHOLHDL 3.8 12/31/2020 0806   CHOLHDL 5.1 07/10/2019 0506   VLDL 32 07/10/2019 0506   LDLCALC 63 12/31/2020 0806   LABVLDL 34 12/31/2020 0806      Lab Results  Component Value Date   TSH 3.180 12/31/2020   TSH 2.00 01/14/2016   FREET4 1.23 12/31/2020           Assessment & Plan:   1) Type 2 diabetes mellitus without complication, without long-term current use of insulin (HCC)  - Netanya B Jaylen Claude has currently uncontrolled symptomatic type 2 DM since 31 years of age.   Recent labs reviewed.  She presents today with her CGM showing improved yet still above target glycemic profile overall  Her POCT A1c today is 9.2%, increasing from last visit of 9%.  She has been stressed lately due to family medical issues.  Analysis of her CGM shows TIR 41%, TAR 59%, TBR0% with a GMI of 8.3%.  - I had a long discussion with her about the progressive nature of diabetes and the pathology behind its complications.  -She does not currently have any complications from diabetes but she remains at a high risk for more acute and chronic complications which include CAD, CVA, CKD, retinopathy, and neuropathy. These are all discussed in detail with her.  - Nutritional counseling  repeated at each appointment due to patients tendency to fall back in to old habits.  - The patient admits there is a room for improvement in their diet and drink choices. -  Suggestion is made for the patient to avoid simple carbohydrates from their diet including Cakes, Sweet Desserts / Pastries, Ice Cream, Soda (diet and regular), Sweet Tea, Candies, Chips, Cookies, Sweet Pastries, Store Bought Juices, Alcohol in Excess of 1-2 drinks a day, Artificial Sweeteners, Coffee Creamer, and "Sugar-free" Products. This will help patient to have stable blood glucose profile and potentially avoid unintended weight gain.   - I encouraged the patient to switch to unprocessed or minimally processed complex starch and increased protein intake (animal or plant source), fruits, and vegetables.   - Patient is advised to stick to a routine mealtimes to eat 3 meals a day and avoid unnecessary snacks (to snack only to correct hypoglycemia).  - she has been scheduled with Norm Salt, RDN, CDE for diabetes education- sees her after her visit with me today.  - I have approached her with the following individualized plan to manage  her diabetes and patient agrees:   -She is advised to continue her current dose of Toujeo 100 units SQ nightly, continue Fiasp 22-28 units TID with meals if glucose is above 90 and she is eating (Specific instructions on how to titrate insulin dosage based on glucose readings given to patient in writing), and Ozempic 2 mg SQ weekly (has tolerated well).  Will change her Metformin to 1000 mg ER daily at bedtime to increase compliance and stop her Glipizide altogether.  -she is encouraged to consistently monitor blood glucose 4 times daily (using her CGM), and to call the clinic if she has readings less than 70 or above 300 for  3 tests in a row.  - she is warned not to take insulin without proper monitoring per orders. - Adjustment parameters are given to her for hypo and hyperglycemia in  writing.  - Specific targets for  A1c;  LDL, HDL,  and Triglycerides were discussed with the patient.  2) Blood Pressure /Hypertension:  Her blood pressure is controlled to target.  She is advised to continue Amlodipine 5 mg po daily, and Lisinopril 10 mg po daily.   3) Lipids/Hyperlipidemia:    Her most recent lipid panel from 12/31/20 shows improved and controlled LDL of 63 and elevated but improved triglycerides of 205.   She is advised to continue Lipitor 10 mg po daily at bedtime.  Side effects and precautions discussed with her.  She was advised to avoid fried foods and butter.  Will recheck lipid panel prior to next visit.  4)  Weight/Diet:  her Body mass index is 34.91 kg/m.  - clearly complicating her diabetes care.   she is a candidate for weight loss. I discussed with her the fact that loss of 5 - 10% of her  current body weight will have the most impact on her diabetes management.  Exercise, and detailed carbohydrates information provided  -  detailed on discharge instructions.  5) Chronic Care/Health Maintenance: -she is on ACEI/ARB and Statin medications and is encouraged to initiate and continue to follow up with Ophthalmology, Dentist,  Podiatrist at least yearly or according to recommendations, and advised to stay away from smoking. I have recommended yearly flu vaccine and pneumonia vaccine at least every 5 years; moderate intensity exercise for up to 150 minutes weekly; and  sleep for at least 7 hours a day.  - she is advised to maintain close follow up with Tommie Sams, DO for primary care needs, as well as her other providers for optimal and coordinated care.      I spent 42 minutes in the care of the patient today including review of labs from CMP, Lipids, Thyroid Function, Hematology (current and previous including abstractions from other facilities); face-to-face time discussing  her blood glucose readings/logs, discussing hypoglycemia and hyperglycemia episodes and  symptoms, medications doses, her options of short and long term treatment based on the latest standards of care / guidelines;  discussion about incorporating lifestyle medicine;  and documenting the encounter. Risk reduction counseling performed per USPSTF guidelines to reduce obesity and cardiovascular risk factors.     Please refer to Patient Instructions for Blood Glucose Monitoring and Insulin/Medications Dosing Guide"  in media tab for additional information. Please  also refer to " Patient Self Inventory" in the Media  tab for reviewed elements of pertinent patient history.  Gladstone Lighter Sams participated in the discussions, expressed understanding, and voiced agreement with the above plans.  All questions were answered to her satisfaction. she is encouraged to contact clinic should she have any questions or concerns prior to her return visit.    Follow up plan: - Return in about 4 months (around 01/14/2022) for Diabetes F/U with A1c in office, Previsit labs, Bring meter and logs.  [name], [facility] [address] [place], [place] [address] Phone: [phone] Fax: [phone]  09/13/2021, 9:16 AM

## 2021-11-02 ENCOUNTER — Other Ambulatory Visit: Payer: Self-pay | Admitting: *Deleted

## 2021-11-02 MED ORDER — NORGESTIMATE-ETH ESTRADIOL 0.25-35 MG-MCG PO TABS: 1.0000 | ORAL_TABLET | Freq: Every day | ORAL | 0 refills | Status: DC

## 2021-11-09 ENCOUNTER — Telehealth: Payer: 59 | Admitting: Family Medicine

## 2021-11-09 DIAGNOSIS — R220 Localized swelling, mass and lump, head: Secondary | ICD-10-CM

## 2021-11-09 DIAGNOSIS — H9201 Otalgia, right ear: Secondary | ICD-10-CM | POA: Diagnosis not present

## 2021-11-09 MED ORDER — AMOXICILLIN-POT CLAVULANATE 875-125 MG PO TABS: 1.0000 | ORAL_TABLET | Freq: Two times a day (BID) | ORAL | 0 refills | Status: DC

## 2021-11-09 NOTE — Progress Notes (Signed)
Virtual Visit Consent   Melanie Mccoy, you are scheduled for a virtual visit with a [facility] provider today. Just as with appointments in the office, your consent must be obtained to participate. Your consent will be active for this visit and any virtual visit you may have with one of our providers in the next 365 days. If you have a MyChart account, a copy of this consent can be sent to you electronically.  As this is a virtual visit, video technology does not allow for your provider to perform a traditional examination. This may limit your provider's ability to fully assess your condition. If your provider identifies any concerns that need to be evaluated in person or the need to arrange testing (such as labs, EKG, etc.), we will make arrangements to do so. Although advances in technology are sophisticated, we cannot ensure that it will always work on either your end or our end. If the connection with a video visit is poor, the visit may have to be switched to a telephone visit. With either a video or telephone visit, we are not always able to ensure that we have a secure connection.  By engaging in this virtual visit, you consent to the provision of healthcare and authorize for your insurance to be billed (if applicable) for the services provided during this visit. Depending on your insurance coverage, you may receive a charge related to this service.  I need to obtain your verbal consent now. Are you willing to proceed with your visit today? Melanie Mccoy has provided verbal consent on 11/09/2021 for a virtual visit (video or telephone). Freddy Finner, NP  Date: 11/09/2021 9:32 AM  Virtual Visit via Video Note   I, Freddy Finner, connected with  Melanie Mccoy  ([id number], [birth year]) on 11/09/21 at  9:30 AM EDT by a video-enabled telemedicine application and verified that I am speaking with the correct person using two identifiers.  Location: Patient: Virtual Visit Location  Patient: Home Provider: Virtual Visit Location Provider: Home Office   I discussed the limitations of evaluation and management by telemedicine and the availability of in person appointments. The patient expressed understanding and agreed to proceed.    History of Present Illness: Melanie Mccoy is a 31 y.o. who identifies as a female who was assigned female at birth, and is being seen today for ear pain with mild facial swelling anterior to ear and pain at back of ear.   HPI: Otalgia  There is pain in the right ear. This is a new problem. The current episode started yesterday (last night). The problem occurs constantly. The problem has been gradually worsening. There has been no fever. The pain is at a severity of 4/10. The pain is mild. Pertinent negatives include no abdominal pain, coughing, diarrhea, ear discharge, headaches, hearing loss, neck pain, rash, rhinorrhea, sore throat or vomiting. She has tried nothing for the symptoms. The treatment provided no relief.    Problems:  Patient Active Problem List   Diagnosis Date Noted   Mixed hyperlipidemia 04/14/2021   Severe obesity (BMI 35.0-39.9) with comorbidity (HCC) 04/14/2021   Abscess, groin 12/31/2020   Essential hypertension 09/18/2019   Uncontrolled type 2 diabetes mellitus with hyperglycemia (HCC) 01/19/2016   PCOS (polycystic ovarian syndrome) 04/15/2014    Allergies:  Allergies  Allergen Reactions   Bee Venom Swelling    Progressively worse with each sting Other reaction(s): Other   Nickel Rash and Itching  Ibuprofen Hives   Medications:  Current Outpatient Medications:    amLODipine (NORVASC) 5 MG tablet, TAKE 1 TABLET(5 MG) BY MOUTH IN THE MORNING, Disp: 90 tablet, Rfl: 1   atorvastatin (LIPITOR) 10 MG tablet, TAKE 1 TABLET(10 MG) BY MOUTH DAILY, Disp: 90 tablet, Rfl: 3   Continuous Blood Gluc Sensor (FREESTYLE LIBRE 3 SENSOR) MISC, 1 application. by Does not apply route every 14 (fourteen) days. Place 1 sensor  on the skin every 14 days. Use to check glucose continuously, Disp: 2 each, Rfl: 6   fluconazole (DIFLUCAN) 150 MG tablet, Take 2 tablets (300 mg total) by mouth once a week. For 2 weeks. (Patient not taking: Reported on 06/14/2021), Disp: 4 tablet, Rfl: 0   glucose blood test strip, Use as instructed, Disp: 50 each, Rfl: 12   insulin aspart (FIASP FLEXTOUCH) 100 UNIT/ML FlexTouch Pen, Inject 22-28 Units into the skin 3 (three) times daily before meals., Disp: 60 mL, Rfl: 3   insulin glargine, 2 Unit Dial, (TOUJEO MAX SOLOSTAR) 300 UNIT/ML Solostar Pen, Inject 100 Units into the skin at bedtime., Disp: 30 mL, Rfl: 2   Insulin Pen Needle (BD PEN NEEDLE NANO 2ND GEN) 32G X 4 MM MISC, Use to inject insulin 6 times daily., Disp: 300 each, Rfl: 3   ketorolac (TORADOL) 10 MG tablet, Take 1 tablet (10 mg total) by mouth every 6 (six) hours as needed for moderate pain or severe pain. (Patient not taking: Reported on 06/14/2021), Disp: 20 tablet, Rfl: 0   Lancets (ACCU-CHEK SOFT TOUCH) lancets, Use as instructed, Disp: 50 each, Rfl: 12   lisinopril (ZESTRIL) 10 MG tablet, Take 1 tablet (10 mg total) by mouth daily., Disp: 90 tablet, Rfl: 3   metFORMIN (GLUCOPHAGE-XR) 500 MG 24 hr tablet, Take 2 tablets (1,000 mg total) by mouth daily with supper., Disp: 180 tablet, Rfl: 3   norgestimate-ethinyl estradiol (MILI) 0.25-35 MG-MCG tablet, Take 1 tablet by mouth daily., Disp: 28 tablet, Rfl: 0   Semaglutide, 2 MG/DOSE, 8 MG/3ML SOPN, Inject 2 mg as directed once a week., Disp: 6 mL, Rfl: 3  Observations/Objective: Patient is well-developed, well-nourished in no acute distress.  Resting comfortably  at home.  Head is normocephalic, atraumatic.  No labored breathing.  Speech is clear and coherent with logical content.  Patient is alert and oriented at baseline.  Mild right side facial swelling anterior to the ear  Assessment and Plan:   1. Ear pain, right  - amoxicillin-clavulanate (AUGMENTIN) 875-125 MG  tablet; Take 1 tablet by mouth 2 (two) times daily for 7 days.  Dispense: 14 tablet; Refill: 0  2. Swelling of right side of face  - amoxicillin-clavulanate (AUGMENTIN) 875-125 MG tablet; Take 1 tablet by mouth 2 (two) times daily for 7 days.  Dispense: 14 tablet; Refill: 0  Questionable wisdom teeth impaction vs middle ear infection. Will treat with Augmentin and advised strict in person precautions and follow up with Dentist if needed.  Ice swelling area as needed   Reviewed side effects, risks and benefits of medication.    Patient acknowledged agreement and understanding of the plan.   Past Medical, Surgical, Social History, Allergies, and Medications have been Reviewed.    Follow Up Instructions: I discussed the assessment and treatment plan with the patient. The patient was provided an opportunity to ask questions and all were answered. The patient agreed with the plan and demonstrated an understanding of the instructions.  A copy of instructions were sent to the patient via MyChart  unless otherwise noted below.     The patient was advised to call back or seek an in-person evaluation if the symptoms worsen or if the condition fails to improve as anticipated.  Time:  I spent 10 minutes with the patient via telehealth technology discussing the above problems/concerns.    Freddy Finner, NP

## 2021-11-09 NOTE — Patient Instructions (Signed)
Melanie Mccoy, thank you for joining Freddy Finner, NP for today's virtual visit.  While this provider is not your primary care provider (PCP), if your PCP is located in our provider database this encounter information will be shared with them immediately following your visit.  Consent: (Patient) Melanie Mccoy provided verbal consent for this virtual visit at the beginning of the encounter.  Current Medications:  Current Outpatient Medications:    amoxicillin-clavulanate (AUGMENTIN) 875-125 MG tablet, Take 1 tablet by mouth 2 (two) times daily for 7 days., Disp: 14 tablet, Rfl: 0   amLODipine (NORVASC) 5 MG tablet, TAKE 1 TABLET(5 MG) BY MOUTH IN THE MORNING, Disp: 90 tablet, Rfl: 1   atorvastatin (LIPITOR) 10 MG tablet, TAKE 1 TABLET(10 MG) BY MOUTH DAILY, Disp: 90 tablet, Rfl: 3   Continuous Blood Gluc Sensor (FREESTYLE LIBRE 3 SENSOR) MISC, 1 application. by Does not apply route every 14 (fourteen) days. Place 1 sensor on the skin every 14 days. Use to check glucose continuously, Disp: 2 each, Rfl: 6   fluconazole (DIFLUCAN) 150 MG tablet, Take 2 tablets (300 mg total) by mouth once a week. For 2 weeks. (Patient not taking: Reported on 06/14/2021), Disp: 4 tablet, Rfl: 0   glucose blood test strip, Use as instructed, Disp: 50 each, Rfl: 12   insulin aspart (FIASP FLEXTOUCH) 100 UNIT/ML FlexTouch Pen, Inject 22-28 Units into the skin 3 (three) times daily before meals., Disp: 60 mL, Rfl: 3   insulin glargine, 2 Unit Dial, (TOUJEO MAX SOLOSTAR) 300 UNIT/ML Solostar Pen, Inject 100 Units into the skin at bedtime., Disp: 30 mL, Rfl: 2   Insulin Pen Needle (BD PEN NEEDLE NANO 2ND GEN) 32G X 4 MM MISC, Use to inject insulin 6 times daily., Disp: 300 each, Rfl: 3   ketorolac (TORADOL) 10 MG tablet, Take 1 tablet (10 mg total) by mouth every 6 (six) hours as needed for moderate pain or severe pain. (Patient not taking: Reported on 06/14/2021), Disp: 20 tablet, Rfl: 0   Lancets (ACCU-CHEK SOFT  TOUCH) lancets, Use as instructed, Disp: 50 each, Rfl: 12   lisinopril (ZESTRIL) 10 MG tablet, Take 1 tablet (10 mg total) by mouth daily., Disp: 90 tablet, Rfl: 3   metFORMIN (GLUCOPHAGE-XR) 500 MG 24 hr tablet, Take 2 tablets (1,000 mg total) by mouth daily with supper., Disp: 180 tablet, Rfl: 3   norgestimate-ethinyl estradiol (MILI) 0.25-35 MG-MCG tablet, Take 1 tablet by mouth daily., Disp: 28 tablet, Rfl: 0   Semaglutide, 2 MG/DOSE, 8 MG/3ML SOPN, Inject 2 mg as directed once a week., Disp: 6 mL, Rfl: 3   Medications ordered in this encounter:  Meds ordered this encounter  Medications   amoxicillin-clavulanate (AUGMENTIN) 875-125 MG tablet    Sig: Take 1 tablet by mouth 2 (two) times daily for 7 days.    Dispense:  14 tablet    Refill:  0    Order Specific Question:   Supervising Provider    Answer:   Hyacinth Meeker, BRIAN [[id number]]     *If you need refills on other medications prior to your next appointment, please contact your pharmacy*  Follow-Up: Call back or seek an in-person evaluation if the symptoms worsen or if the condition fails to improve as anticipated.  Other Instructions  Questionable wisdom teeth vs middle ear infection  Take medication as ordered and use ice for swelling Will need to be seen in person if symptoms worsen    If you have been instructed to  have an in-person evaluation today at a local Urgent Care facility, please use the link below. It will take you to a list of all of our available [facility], including address, phone number and hours of operation. Please do not delay care.  [facility]  If you or a family member do not have a primary care provider, use the link below to schedule a visit and establish care. When you choose a [facility] primary care physician or advanced practice provider, you gain a long-term partner in health. Find a Primary Care Provider  Learn more about [facility]'s in-office and virtual care  options: [facility] - Get Care Now

## 2021-11-15 ENCOUNTER — Ambulatory Visit
Admission: RE | Admit: 2021-11-15 | Discharge: 2021-11-15 | Disposition: A | Discharge status: Home / Self Care | Payer: 59 | Source: Ambulatory Visit | Attending: Nurse Practitioner | Admitting: Nurse Practitioner

## 2021-11-15 VITALS — BP 127/88 | HR 96 | Temp 98.4°F | Resp 18

## 2021-11-15 DIAGNOSIS — R0982 Postnasal drip: Secondary | ICD-10-CM

## 2021-11-15 DIAGNOSIS — H9201 Otalgia, right ear: Secondary | ICD-10-CM

## 2021-11-15 DIAGNOSIS — J029 Acute pharyngitis, unspecified: Secondary | ICD-10-CM | POA: Diagnosis not present

## 2021-11-15 DIAGNOSIS — R220 Localized swelling, mass and lump, head: Secondary | ICD-10-CM

## 2021-11-15 MED ORDER — FLUTICASONE PROPIONATE 50 MCG/ACT NA SUSP: 2.0000 | Freq: Every day | NASAL | 0 refills | Status: DC

## 2021-11-15 MED ORDER — AMOXICILLIN-POT CLAVULANATE 875-125 MG PO TABS: 1.0000 | ORAL_TABLET | Freq: Two times a day (BID) | ORAL | 0 refills | Status: AC

## 2021-11-15 NOTE — Discharge Instructions (Addendum)
-   Please continue the Augmentin and take it for 10 days total to cover you for strep throat - Start Tylenol/ibuprofen as needed for pain - Also start flonase nasal spray to help with drainage going down the back of your throat

## 2021-11-15 NOTE — ED Triage Notes (Signed)
Pt presents with c/o sore throat that began over the weekend, pt is currently taking Augmentin from recent tele-visit that was started on Friday

## 2021-11-15 NOTE — ED Provider Notes (Signed)
[facility]    CSN: [id number] Arrival date & time: 11/15/21  1127      History   Chief Complaint Chief Complaint  Patient presents with   Sore Throat    Strep throat, covid, or something else. Throat sore. Treated for ear infection. Can't breathe good. - Entered by patient    HPI Melanie Mccoy Violia Knopf is a 31 y.o. female.   Patient presents for roughly 1 week of right facial swelling, and sore/scratchy throat that she developed over the weekend after drinking after her mother.  Reports she also had ear pain last week, did a virtual visit, and was treated with Augmentin for an ear infection of the right ear.  Reports the ear pain is now improved.  She denies fever, body aches, chills, significant cough, shortness of breath/wheezing, chest pain or tightness.  Denies known postnasal drainage, nasal congestion.  No headache or sinus pressure, ear drainage, abdominal pain, nausea/vomiting, and diarrhea.  Reports she is eating and drinking normally.  No fatigue or new rash.  No known sick contacts.     Past Medical History:  Diagnosis Date   Anemia    slightly anemic, iron supplements every 2-3 days   Bartholin cyst    Chlamydia 01/2011   Diabetes mellitus without complication (HCC)    Family history of anesthesia complication    grandmother had spinal with nerve damage   No pertinent past medical history     Patient Active Problem List   Diagnosis Date Noted   Mixed hyperlipidemia 04/14/2021   Severe obesity (BMI 35.0-39.9) with comorbidity (HCC) 04/14/2021   Abscess, groin 12/31/2020   Essential hypertension 09/18/2019   Uncontrolled type 2 diabetes mellitus with hyperglycemia (HCC) 01/19/2016   PCOS (polycystic ovarian syndrome) 04/15/2014    Past Surgical History:  Procedure Laterality Date   CESAREAN SECTION N/A 11/22/2012   Procedure: PRIMARY CESAREAN SECTION;  Surgeon: Esmeralda Arthur, MD;  Location: [facility];  Service: Obstetrics;  Laterality: N/A;   CYST  REMOVAL TRUNK  2005    OB History     Gravida  2   Para  2   Term      Preterm      AB      Living  1      SAB      IAB      Ectopic      Multiple      Live Births  1            Home Medications    Prior to Admission medications   Medication Sig Start Date End Date Taking? Authorizing Provider  fluticasone (FLONASE) 50 MCG/ACT nasal spray Place 2 sprays into both nostrils daily. 11/15/21  Yes Cathlean Marseilles A, NP  amLODipine (NORVASC) 5 MG tablet TAKE 1 TABLET(5 MG) BY MOUTH IN THE MORNING 06/14/21   Dani Gobble, NP  amoxicillin-clavulanate (AUGMENTIN) 875-125 MG tablet Take 1 tablet by mouth 2 (two) times daily for 3 days. 11/15/21 11/18/21  Valentino Nose, NP  atorvastatin (LIPITOR) 10 MG tablet TAKE 1 TABLET(10 MG) BY MOUTH DAILY 09/13/21   Dani Gobble, NP  Continuous Blood Gluc Sensor (FREESTYLE LIBRE 3 SENSOR) MISC 1 application. by Does not apply route every 14 (fourteen) days. Place 1 sensor on the skin every 14 days. Use to check glucose continuously 06/14/21   Dani Gobble, NP  fluconazole (DIFLUCAN) 150 MG tablet Take 2 tablets (300 mg total) by mouth once a  week. For 2 weeks. Patient not taking: Reported on 06/14/2021 03/19/21   Tommie Sams, DO  glucose blood test strip Use as instructed 01/14/19   Campbell Riches, NP  insulin aspart (FIASP FLEXTOUCH) 100 UNIT/ML FlexTouch Pen Inject 22-28 Units into the skin 3 (three) times daily before meals. 09/13/21   Dani Gobble, NP  insulin glargine, 2 Unit Dial, (TOUJEO MAX SOLOSTAR) 300 UNIT/ML Solostar Pen Inject 100 Units into the skin at bedtime. 09/13/21   Dani Gobble, NP  Insulin Pen Needle (BD PEN NEEDLE NANO 2ND GEN) 32G X 4 MM MISC Use to inject insulin 6 times daily. 09/13/21   Dani Gobble, NP  ketorolac (TORADOL) 10 MG tablet Take 1 tablet (10 mg total) by mouth every 6 (six) hours as needed for moderate pain or severe pain. Patient not taking: Reported on  06/14/2021 04/15/21   Tommie Sams, DO  Lancets (ACCU-CHEK SOFT TOUCH) lancets Use as instructed 01/14/19   Campbell Riches, NP  lisinopril (ZESTRIL) 10 MG tablet Take 1 tablet (10 mg total) by mouth daily. 09/13/21   Dani Gobble, NP  metFORMIN (GLUCOPHAGE-XR) 500 MG 24 hr tablet Take 2 tablets (1,000 mg total) by mouth daily with supper. 09/13/21   Dani Gobble, NP  norgestimate-ethinyl estradiol (MILI) 0.25-35 MG-MCG tablet Take 1 tablet by mouth daily. 11/02/21   Tommie Sams, DO  Semaglutide, 2 MG/DOSE, 8 MG/3ML SOPN Inject 2 mg as directed once a week. 09/13/21   Dani Gobble, NP    Family History Family History  Problem Relation Age of Onset   Cancer Other    Heart disease Other    Diabetes Other    Diabetes Mother    Diabetes Father    Diabetes Maternal Grandfather     Social History Social History   Tobacco Use   Smoking status: Never   Smokeless tobacco: Never  Vaping Use   Vaping Use: Never used  Substance Use Topics   Alcohol use: No    Comment: Occassionally   Drug use: No     Allergies   Bee venom, Nickel, and Ibuprofen   Review of Systems Review of Systems Per HPI  Physical Exam Triage Vital Signs ED Triage Vitals  Enc Vitals Group     BP 11/15/21 1150 127/88     Pulse Rate 11/15/21 1150 96     Resp 11/15/21 1150 18     Temp 11/15/21 1150 98.4 F (36.9 C)     Temp src --      SpO2 11/15/21 1150 97 %     Weight --      Height --      Head Circumference --      Peak Flow --      Pain Score 11/15/21 1148 4     Pain Loc --      Pain Edu? --      Excl. in GC? --    No data found.  Updated Vital Signs BP 127/88   Pulse 96   Temp 98.4 F (36.9 C)   Resp 18   LMP 11/08/2021   SpO2 97%   Visual Acuity Right Eye Distance:   Left Eye Distance:   Bilateral Distance:    Right Eye Near:   Left Eye Near:    Bilateral Near:     Physical Exam Vitals and nursing note reviewed.  Constitutional:      General: She is not in  acute  distress.    Appearance: She is well-developed. She is not toxic-appearing.  HENT:     Head: Normocephalic and atraumatic.     Right Ear: Tympanic membrane and ear canal normal. No drainage, swelling or tenderness. No middle ear effusion. Tympanic membrane is not erythematous.     Left Ear: Tympanic membrane and ear canal normal. No drainage, swelling or tenderness.  No middle ear effusion. Tympanic membrane is not erythematous.     Nose: No congestion or rhinorrhea.     Right Sinus: No maxillary sinus tenderness or frontal sinus tenderness.     Left Sinus: No maxillary sinus tenderness or frontal sinus tenderness.     Mouth/Throat:     Mouth: Mucous membranes are moist.     Pharynx: Oropharynx is clear. Uvula midline. Posterior oropharyngeal erythema present. No oropharyngeal exudate.     Tonsils: No tonsillar exudate. 1+ on the right. 1+ on the left.     Comments: Cobblestoning of posterior pharynx Eyes:     Extraocular Movements:     Right eye: Normal extraocular motion.     Left eye: Normal extraocular motion.  Cardiovascular:     Rate and Rhythm: Normal rate and regular rhythm.  Pulmonary:     Effort: Pulmonary effort is normal. No respiratory distress.     Breath sounds: Normal breath sounds. No wheezing, rhonchi or rales.  Musculoskeletal:     Cervical back: Normal range of motion and neck supple.  Lymphadenopathy:     Cervical: No cervical adenopathy.  Skin:    General: Skin is warm and dry.     Capillary Refill: Capillary refill takes less than 2 seconds.     Coloration: Skin is not pale.     Findings: No erythema or rash.  Neurological:     Mental Status: She is alert and oriented to person, place, and time.  Psychiatric:        Behavior: Behavior is cooperative.      UC Treatments / Results  Labs (all labs ordered are listed, but only abnormal results are displayed) Labs Reviewed - No data to display  EKG   Radiology No results  found.  Procedures Procedures (including critical care time)  Medications Ordered in UC Medications - No data to display  Initial Impression / Assessment and Plan / UC Course  I have reviewed the triage vital signs and the nursing notes.  Pertinent labs & imaging results that were available during my care of the patient were reviewed by me and considered in my medical decision making (see chart for details).    Patient is well-appearing, normotensive, afebrile, not tachycardic, not tachypneic, oxygenating well on room air.  Rapid strep throat test deferred today secondary to antibiotic use-she has been taking Augmentin for the past 4 days.  Reports she has missed a few doses.  Will extend Augmentin for 3 days to cover for strep pharyngitis.  Also recommended starting flonase nasal spray to help with post nasal drainage.  ER precautions and return precautions discussed.  The patient was given the opportunity to ask questions.  All questions answered to their satisfaction.  The patient is in agreement to this plan.   Final Clinical Impressions(s) / UC Diagnoses   Final diagnoses:  Acute pharyngitis, unspecified etiology  Post-nasal drainage     Discharge Instructions      - Please continue the Augmentin and take it for 10 days total to cover you for strep throat - Start Tylenol/ibuprofen as needed for pain -  Also start flonase nasal spray to help with drainage going down the back of your throat   ED Prescriptions     Medication Sig Dispense Auth. Provider   amoxicillin-clavulanate (AUGMENTIN) 875-125 MG tablet Take 1 tablet by mouth 2 (two) times daily for 3 days. 6 tablet Cathlean Marseilles A, NP   fluticasone (FLONASE) 50 MCG/ACT nasal spray Place 2 sprays into both nostrils daily. 16 g Valentino Nose, NP      PDMP not reviewed this encounter.   Valentino Nose, NP 11/15/21 [phone]

## 2021-11-28 ENCOUNTER — Other Ambulatory Visit: Payer: Self-pay | Admitting: Family Medicine

## 2021-12-07 ENCOUNTER — Ambulatory Visit: Payer: 59 | Admitting: Family Medicine

## 2021-12-07 ENCOUNTER — Telehealth: Payer: Self-pay | Admitting: Family Medicine

## 2021-12-07 ENCOUNTER — Encounter: Payer: Self-pay | Admitting: Family Medicine

## 2021-12-07 VITALS — BP 140/82 | HR 95 | Temp 98.3°F | Wt 213.2 lb

## 2021-12-07 DIAGNOSIS — J020 Streptococcal pharyngitis: Secondary | ICD-10-CM | POA: Diagnosis not present

## 2021-12-07 DIAGNOSIS — R059 Cough, unspecified: Secondary | ICD-10-CM | POA: Diagnosis not present

## 2021-12-07 LAB — POCT RAPID STREP A (OFFICE): Rapid Strep A Screen: POSITIVE — AB

## 2021-12-07 MED ORDER — AMOXICILLIN 500 MG PO TABS: 500.0000 mg | ORAL_TABLET | Freq: Two times a day (BID) | ORAL | 0 refills | Status: DC

## 2021-12-07 NOTE — Telephone Encounter (Signed)
Coral Spikes, DO     Wait to go to work. Please give note.

## 2021-12-07 NOTE — Telephone Encounter (Signed)
Patient notified- work note in my chart

## 2021-12-07 NOTE — Telephone Encounter (Signed)
Patient was seen this morning and wanting to know if she can go to work or wait on test results before returning to work if so needs work note. Please advise

## 2021-12-07 NOTE — Progress Notes (Signed)
Subjective:  Patient ID: Melanie Mccoy, female    DOB: 1990/07/17  Age: 31 y.o. MRN: [id number]  CC: Chief Complaint  Patient presents with   Cough    Pt arrives with chest pain, congestion at time, runny nose at time, fatigue, sore throat and cough. At home covid test negative yesterday. Symptoms began Friday.     HPI:  31 year old female presents with respiratory symptoms.  Patient states that she has been sick since Friday.  She reports runny nose, congestion, fatigue, cough, sore throat.  She is also has some chest discomfort.  Daughter has recently been sick.  Home COVID testing negative.  No documented fever.  No relieving factors.  No other complaints or concerns at this time.  Patient Active Problem List   Diagnosis Date Noted   Strep pharyngitis 12/07/2021   Mixed hyperlipidemia 04/14/2021   Severe obesity (BMI 35.0-39.9) with comorbidity ([facility]) 04/14/2021   Abscess, groin 12/31/2020   Essential hypertension 09/18/2019   Uncontrolled type 2 diabetes mellitus with hyperglycemia ([place]) 01/19/2016   PCOS (polycystic ovarian syndrome) 04/15/2014    Social Hx   Social History   Socioeconomic History   Marital status: Single    Spouse name: Not on file   Number of children: Not on file   Years of education: Not on file   Highest education level: Not on file  Occupational History   Not on file  Tobacco Use   Smoking status: Never   Smokeless tobacco: Never  Vaping Use   Vaping Use: Never used  Substance and Sexual Activity   Alcohol use: No    Comment: Occassionally   Drug use: No   Sexual activity: Yes  Other Topics Concern   Not on file  Social History Narrative   Not on file   Social Determinants of Health   Financial Resource Strain: Not on file  Food Insecurity: Not on file  Transportation Needs: Not on file  Physical Activity: Not on file  Stress: Not on file  Social Connections: Not on file    Review of Systems Per HPI  Objective:  BP  (!) 140/82   Pulse 95   Temp 98.3 F (36.8 C)   Wt 213 lb 3.2 oz (96.7 kg)   LMP 11/08/2021   SpO2 98%   BMI 35.48 kg/m      12/07/2021   11:25 AM 12/07/2021   11:20 AM 11/15/2021   11:50 AM  BP/Weight  Systolic BP 440 347 425  Diastolic BP 82 87 88  Wt. (Lbs)  213.2   BMI  35.48 kg/m2     Physical Exam Vitals and nursing note reviewed.  Constitutional:      General: She is not in acute distress.    Appearance: Normal appearance. She is obese.  HENT:     Head: Normocephalic and atraumatic.     Mouth/Throat:     Pharynx: Posterior oropharyngeal erythema present. No oropharyngeal exudate.  Eyes:     General:        Right eye: No discharge.        Left eye: No discharge.     Conjunctiva/sclera: Conjunctivae normal.  Cardiovascular:     Rate and Rhythm: Normal rate and regular rhythm.  Pulmonary:     Effort: Pulmonary effort is normal.     Breath sounds: Normal breath sounds. No wheezing, rhonchi or rales.  Neurological:     Mental Status: She is alert.  Psychiatric:  Mood and Affect: Mood normal.        Behavior: Behavior normal.     Lab Results  Component Value Date   WBC 8.7 01/29/2020   HGB 14.6 01/29/2020   HCT 42.5 01/29/2020   PLT 298 01/29/2020   GLUCOSE 222 (H) 12/31/2020   CHOL 132 12/31/2020   TRIG 205 (H) 12/31/2020   HDL 35 (L) 12/31/2020   LDLCALC 63 12/31/2020   ALT 26 12/31/2020   AST 19 12/31/2020   NA 136 12/31/2020   K 3.9 12/31/2020   CL 97 12/31/2020   CREATININE 0.63 12/31/2020   BUN 13 12/31/2020   CO2 22 12/31/2020   TSH 3.180 12/31/2020   INR 1.15 02/11/2011   HGBA1C 9.2 09/13/2021   MICROALBUR 80 01/01/2021     Assessment & Plan:   Problem List Items Addressed This Visit       Respiratory   Strep pharyngitis - Primary    Rapid strep positive today.  Treating with amoxicillin.      Relevant Orders   POCT rapid strep A (Completed)   Other Visit Diagnoses     Cough, unspecified type       Relevant Orders    COVID-19, Flu A+B and RSV       Meds ordered this encounter  Medications   amoxicillin (AMOXIL) 500 MG tablet    Sig: Take 1 tablet (500 mg total) by mouth 2 (two) times daily.    Dispense:  20 tablet    Refill:  0    Sanjit Mcmichael DO [facility]

## 2021-12-07 NOTE — Patient Instructions (Signed)
Medication as prescribed.  Hope you feel better soon.  Take care  Dr. Lacinda Axon

## 2021-12-07 NOTE — Assessment & Plan Note (Signed)
Rapid strep positive today.  Treating with amoxicillin.

## 2021-12-08 LAB — SPECIMEN STATUS REPORT

## 2021-12-08 LAB — COVID-19, FLU A+B AND RSV
Influenza A, NAA: NOT DETECTED
Influenza B, NAA: NOT DETECTED
RSV, NAA: NOT DETECTED
SARS-CoV-2, NAA: NOT DETECTED

## 2021-12-13 ENCOUNTER — Other Ambulatory Visit: Payer: Self-pay | Admitting: Nurse Practitioner

## 2021-12-30 ENCOUNTER — Other Ambulatory Visit: Payer: Self-pay | Admitting: Nurse Practitioner

## 2022-01-07 ENCOUNTER — Telehealth: Payer: Self-pay | Admitting: Nurse Practitioner

## 2022-01-07 DIAGNOSIS — E119 Type 2 diabetes mellitus without complications: Secondary | ICD-10-CM

## 2022-01-07 MED ORDER — SEMAGLUTIDE (2 MG/DOSE) 8 MG/3ML ~~LOC~~ SOPN: 2.0000 mg | PEN_INJECTOR | SUBCUTANEOUS | 3 refills | Status: DC

## 2022-01-07 NOTE — Telephone Encounter (Signed)
Pt called and has not been able to get her ozempic. I made her aware of the nationwide shortage. What do you want her to do in the interim?

## 2022-01-07 NOTE — Telephone Encounter (Signed)
I will try sending her script to [facility] out of [place].  They tend to have good supply and will deliver.  Looks like Dr. Lacinda Axon was taking over her birth control pill and he wants physical before refilling.

## 2022-01-07 NOTE — Telephone Encounter (Signed)
Also, she said she wanted to move this appt out 4 more months because she does not have ozempic and I told her to wait because I need to know what you want her to do first. Please advise   Also She said you have been refilling her birth control and said you denied it this time? Not sure of this. Please advise

## 2022-01-11 ENCOUNTER — Encounter: Payer: 59 | Attending: Family Medicine | Admitting: Nutrition

## 2022-01-11 ENCOUNTER — Encounter: Payer: Self-pay | Admitting: Nutrition

## 2022-01-11 DIAGNOSIS — E785 Hyperlipidemia, unspecified: Secondary | ICD-10-CM | POA: Insufficient documentation

## 2022-01-11 DIAGNOSIS — E282 Polycystic ovarian syndrome: Secondary | ICD-10-CM | POA: Insufficient documentation

## 2022-01-11 DIAGNOSIS — I10 Essential (primary) hypertension: Secondary | ICD-10-CM | POA: Insufficient documentation

## 2022-01-11 DIAGNOSIS — E1169 Type 2 diabetes mellitus with other specified complication: Secondary | ICD-10-CM | POA: Insufficient documentation

## 2022-01-11 DIAGNOSIS — E119 Type 2 diabetes mellitus without complications: Secondary | ICD-10-CM | POA: Insufficient documentation

## 2022-01-11 NOTE — Progress Notes (Signed)
This visit was completed via telephone. She is not able to come into the office right now due to her work schedule.   I spoke with Melanie Mccoy and verified that I was speaking with the correct person with two patient identifiers (full name and date of birth).   I discussed the limitations related to this kind of visit and the patient is willing to proceed.  She notes she remains under a lot of stress and working 2 jobs. She notes she is no longer able to take a lunch break at work.  8 am: FBS 202. Took 24 units. Ate a ham and cheese sandwich 11 am BS was 280's before lunch and she took 25 units. Ate Japanese of rice, chicken and vegetables. 830 pm  BS was 240's  Steak, Greens and corn Took her 100 units of Toujeo at night but didn't check her BS with her [name].  She notes she drinks 6-8 bottles of water per day. Stays Thirsty, frequent urination.  BS are still poorly controlled. She is at high risk for macro/microvascular complications.  Diet is still poor in plant based foods: vegetables, fruits and high fiber foods.   She r/s her appt with Melanie Mccoy til March 2024 due to not able to take any time off to make DR. Appts. Encouraged her to make a phone visit to review blood sugars and get advised for insulin adjustments. She verbalized understanding.  [name] shows that 99% of her BS are above range. Only 1% BS in range.  Avg BS is 300's. HIghest blood sugars are after lunch and remain high til in am.   Reviewed importance of taking proper amounts of her insulin as prescribed. Stressed the need to contact Melanie Mccoy for adjustments in her insulin regimen due to extremely high blood sugars.  Goals  Eat three meals per day; eat breakfast sandwich on way to work and then eat lunch at 12. Take sliding scale insulin with meals and take Toujeo 100 units at night. Don't skip insulin doses. Cut out alcohol if being consumed. Increase fresh fruits, vegetables.  Cut out rice and heavy pasta  dishes and eat more salads and vegetables. Call [name] office at [phone] to discuss insulin needs.  IF BS are over 400's, you need to go to ER.  Follow up in 2 months.

## 2022-01-11 NOTE — Patient Instructions (Signed)
Goals  Eat three meals per day; eat breakfast sandwich on way to work and then eat lunch at 12. Take sliding scale insulin with meals and take Toujeo 100 units at night. Don't skip insulin doses. Cut out alcohol if being consumed. Increase fresh fruits, vegetables.  Cut out rice and heavy pasta dishes and eat more salads and vegetables. Call [name] office at [phone] to discuss insulin needs.

## 2022-01-14 ENCOUNTER — Ambulatory Visit: Payer: 59 | Admitting: Nurse Practitioner

## 2022-01-17 ENCOUNTER — Telehealth: Payer: 59 | Admitting: Nutrition

## 2022-01-17 ENCOUNTER — Telehealth: Payer: Self-pay | Admitting: Nutrition

## 2022-01-17 NOTE — Telephone Encounter (Signed)
VM left on her phone to request her to call [facility] office to schedule an in office or mychart visit with [name] to go over blood sugars and make adjustments in insulin dosing. Stressed that only 1% of her BS are in range, with avg of 300-400's per her Josephine Igo and she needs medication adjustments as soon as possible.

## 2022-01-25 ENCOUNTER — Ambulatory Visit (INDEPENDENT_AMBULATORY_CARE_PROVIDER_SITE_OTHER): Payer: 59 | Admitting: Family Medicine

## 2022-01-25 ENCOUNTER — Encounter: Payer: Self-pay | Admitting: Family Medicine

## 2022-01-25 VITALS — BP 148/98 | HR 115 | Temp 98.8°F | Ht 64.96 in | Wt 216.2 lb

## 2022-01-25 DIAGNOSIS — E1165 Type 2 diabetes mellitus with hyperglycemia: Secondary | ICD-10-CM | POA: Diagnosis not present

## 2022-01-25 DIAGNOSIS — I10 Essential (primary) hypertension: Secondary | ICD-10-CM

## 2022-01-25 DIAGNOSIS — E782 Mixed hyperlipidemia: Secondary | ICD-10-CM | POA: Diagnosis not present

## 2022-01-25 DIAGNOSIS — Z0000 Encounter for general adult medical examination without abnormal findings: Secondary | ICD-10-CM

## 2022-01-25 DIAGNOSIS — Z0001 Encounter for general adult medical examination with abnormal findings: Secondary | ICD-10-CM

## 2022-01-25 DIAGNOSIS — Z130 Encounter for screening for diseases of the blood and blood-forming organs and certain disorders involving the immune mechanism: Secondary | ICD-10-CM

## 2022-01-25 DIAGNOSIS — E119 Type 2 diabetes mellitus without complications: Secondary | ICD-10-CM | POA: Diagnosis not present

## 2022-01-25 DIAGNOSIS — R748 Abnormal levels of other serum enzymes: Secondary | ICD-10-CM

## 2022-01-25 MED ORDER — NORGESTIMATE-ETH ESTRADIOL 0.25-35 MG-MCG PO TABS: 1.0000 | ORAL_TABLET | Freq: Every day | ORAL | 11 refills | Status: DC

## 2022-01-25 MED ORDER — AMLODIPINE BESYLATE 10 MG PO TABS: 10.0000 mg | ORAL_TABLET | Freq: Every day | ORAL | 3 refills | Status: DC

## 2022-01-25 NOTE — Patient Instructions (Signed)
Labs today.  I have increased your BP medication.  I have refilled your birth control.  Follow up in 3 months.  Take care  Dr. Adriana Simas

## 2022-01-26 LAB — CMP14+EGFR
ALT: 37 IU/L — ABNORMAL HIGH (ref 0–32)
AST: 50 IU/L — ABNORMAL HIGH (ref 0–40)
Albumin/Globulin Ratio: 1.6 (ref 1.2–2.2)
Albumin: 4.5 g/dL (ref 4.0–5.0)
Alkaline Phosphatase: 156 IU/L — ABNORMAL HIGH (ref 44–121)
BUN/Creatinine Ratio: 14 (ref 9–23)
BUN: 12 mg/dL (ref 6–20)
Bilirubin Total: 0.2 mg/dL (ref 0.0–1.2)
CO2: 23 mmol/L (ref 20–29)
Calcium: 10.4 mg/dL — ABNORMAL HIGH (ref 8.7–10.2)
Chloride: 98 mmol/L (ref 96–106)
Creatinine, Ser: 0.83 mg/dL (ref 0.57–1.00)
Globulin, Total: 2.8 g/dL (ref 1.5–4.5)
Glucose: 181 mg/dL — ABNORMAL HIGH (ref 70–99)
Potassium: 4 mmol/L (ref 3.5–5.2)
Sodium: 143 mmol/L (ref 134–144)
Total Protein: 7.3 g/dL (ref 6.0–8.5)
eGFR: 98 mL/min/{1.73_m2} (ref >59)

## 2022-01-26 LAB — CBC
Hematocrit: 48.8 % — ABNORMAL HIGH (ref 34.0–46.6)
Hemoglobin: 15.7 g/dL (ref 11.1–15.9)
MCH: 28.6 pg (ref 26.6–33.0)
MCHC: 32.2 g/dL (ref 31.5–35.7)
MCV: 89 fL (ref 79–97)
Platelets: 430 10*3/uL (ref 150–450)
RBC: 5.48 x10E6/uL — ABNORMAL HIGH (ref 3.77–5.28)
RDW: 14.5 % (ref 11.7–15.4)
WBC: 12.4 10*3/uL — ABNORMAL HIGH (ref 3.4–10.8)

## 2022-01-26 LAB — LIPID PANEL
Chol/HDL Ratio: 4.2 ratio (ref 0.0–4.4)
Cholesterol, Total: 158 mg/dL (ref 100–199)
HDL: 38 mg/dL — ABNORMAL LOW (ref >39)
LDL Chol Calc (NIH): 52 mg/dL (ref 0–99)
Triglycerides: 459 mg/dL — ABNORMAL HIGH (ref 0–149)
VLDL Cholesterol Cal: 68 mg/dL — ABNORMAL HIGH (ref 5–40)

## 2022-01-26 LAB — MICROALBUMIN / CREATININE URINE RATIO
Creatinine, Urine: 276 mg/dL
Microalb/Creat Ratio: 216 mg/g creat — ABNORMAL HIGH (ref 0–29)
Microalbumin, Urine: 597.2 ug/mL

## 2022-01-26 LAB — HEMOGLOBIN A1C
Est. average glucose Bld gHb Est-mCnc: 249 mg/dL
Hgb A1c MFr Bld: 10.3 % — ABNORMAL HIGH (ref 4.8–5.6)

## 2022-01-28 ENCOUNTER — Other Ambulatory Visit: Payer: Self-pay | Admitting: Family Medicine

## 2022-01-28 DIAGNOSIS — Z0000 Encounter for general adult medical examination without abnormal findings: Secondary | ICD-10-CM | POA: Insufficient documentation

## 2022-01-28 MED ORDER — AMLODIPINE BESYLATE 10 MG PO TABS: 10.0000 mg | ORAL_TABLET | Freq: Every day | ORAL | 3 refills | Status: DC

## 2022-01-28 NOTE — Progress Notes (Signed)
Subjective:  Patient ID: Melanie Mccoy, female    DOB: Dec 13, 1990  Age: 31 y.o. MRN: [id number]  CC: Chief Complaint  Patient presents with   Annual Exam    Last pap 05/01/20 repeat 3 years Refill on birth control    HPI:  31 year old female with Uncontrolled DM-2, HLD, Obesity, HTN presents for an annual exam.   Diabetes remains uncontrolled. Follows with Endo. Needs labs today. Needs foot exam.   HTN uncontrolled as well. Currently on Norvasc and Lisinopril.   Needs refill of OCP. Declines influenza vaccine. Pap up to date. Advised that she is overdue for exam exam.    Patient Active Problem List   Diagnosis Date Noted   Annual physical exam 01/28/2022   Mixed hyperlipidemia 04/14/2021   Severe obesity (BMI 35.0-39.9) with comorbidity ([place]) 04/14/2021   Essential hypertension 09/18/2019   Uncontrolled type 2 diabetes mellitus with hyperglycemia ([place]) 01/19/2016   PCOS (polycystic ovarian syndrome) 04/15/2014    Social Hx   Social History   Socioeconomic History   Marital status: Single    Spouse name: Not on file   Number of children: Not on file   Years of education: Not on file   Highest education level: Not on file  Occupational History   Not on file  Tobacco Use   Smoking status: Never   Smokeless tobacco: Never  Vaping Use   Vaping Use: Never used  Substance and Sexual Activity   Alcohol use: No    Comment: Occassionally   Drug use: No   Sexual activity: Yes  Other Topics Concern   Not on file  Social History Narrative   Not on file   Social Determinants of Health   Financial Resource Strain: Not on file  Food Insecurity: Not on file  Transportation Needs: Not on file  Physical Activity: Not on file  Stress: Not on file  Social Connections: Not on file    Review of Systems  Respiratory: Negative.    Cardiovascular: Negative.   Skin:  Positive for rash.   Objective:  BP (!) 148/98   Pulse (!) 115   Temp 98.8 F (37.1 C) (Oral)    Ht 5' 4.96" (1.65 m)   Wt 216 lb 3.2 oz (98.1 kg)   LMP 12/26/2021 (Exact Date)   SpO2 96%   BMI 36.02 kg/m      01/25/2022    2:50 PM 01/25/2022    2:36 PM 12/07/2021   11:25 AM  BP/Weight  Systolic BP 209 470 962  Diastolic BP 98 836 82  Wt. (Lbs)  216.2   BMI  36.02 kg/m2     Physical Exam Vitals and nursing note reviewed.  Constitutional:      General: She is not in acute distress.    Appearance: Normal appearance. She is obese.  HENT:     Head: Normocephalic and atraumatic.     Right Ear: Tympanic membrane normal.     Left Ear: Tympanic membrane normal.     Nose: Nose normal.     Mouth/Throat:     Pharynx: Oropharynx is clear.  Eyes:     General:        Right eye: No discharge.        Left eye: No discharge.     Conjunctiva/sclera: Conjunctivae normal.  Cardiovascular:     Rate and Rhythm: Normal rate and regular rhythm.  Pulmonary:     Effort: Pulmonary effort is normal.  Breath sounds: Normal breath sounds. No wheezing, rhonchi or rales.  Abdominal:     General: There is no distension.     Palpations: Abdomen is soft.     Tenderness: There is no abdominal tenderness.  Skin:    Findings: Rash present.  Neurological:     Mental Status: She is alert.  Psychiatric:        Mood and Affect: Mood normal.        Behavior: Behavior normal.     Lab Results  Component Value Date   WBC 12.4 (H) 01/25/2022   HGB 15.7 01/25/2022   HCT 48.8 (H) 01/25/2022   PLT 430 01/25/2022   GLUCOSE 181 (H) 01/25/2022   CHOL 158 01/25/2022   TRIG 459 (H) 01/25/2022   HDL 38 (L) 01/25/2022   LDLCALC 52 01/25/2022   ALT 37 (H) 01/25/2022   AST 50 (H) 01/25/2022   NA 143 01/25/2022   K 4.0 01/25/2022   CL 98 01/25/2022   CREATININE 0.83 01/25/2022   BUN 12 01/25/2022   CO2 23 01/25/2022   TSH 3.180 12/31/2020   INR 1.15 02/11/2011   HGBA1C 10.3 (H) 01/25/2022   MICROALBUR 80 01/01/2021     Assessment & Plan:   Problem List Items Addressed This Visit        Cardiovascular and Mediastinum   Essential hypertension    Uncontrolled. Norvasc increased today.       Relevant Medications   amLODipine (NORVASC) 10 MG tablet     Endocrine   Uncontrolled type 2 diabetes mellitus with hyperglycemia (HCC)    A1C today reveals poor control; needs follow up with Endo.       Relevant Orders   CMP14+EGFR (Completed)   Hemoglobin A1c (Completed)   Microalbumin / creatinine urine ratio (Completed)     Other   Annual physical exam - Primary    Labs today. Pap smear up to date. Foot exam performed today (see quality metrics section; normal). Advised to get eye exam.      Mixed hyperlipidemia   Relevant Medications   amLODipine (NORVASC) 10 MG tablet   Other Relevant Orders   Lipid panel (Completed)   Other Visit Diagnoses     Screening for deficiency anemia       Relevant Orders   CBC (Completed)   Diabetes mellitus without complication ([place])       Relevant Medications   amLODipine (NORVASC) 10 MG tablet       Meds ordered this encounter  Medications   DISCONTD: amLODipine (NORVASC) 10 MG tablet    Sig: Take 1 tablet (10 mg total) by mouth daily. TAKE 1 TABLET(5 MG) BY MOUTH IN THE MORNING    Dispense:  90 tablet    Refill:  3   norgestimate-ethinyl estradiol (MILI) 0.25-35 MG-MCG tablet    Sig: Take 1 tablet by mouth daily.    Dispense:  28 tablet    Refill:  11   amLODipine (NORVASC) 10 MG tablet    Sig: Take 1 tablet (10 mg total) by mouth daily.    Dispense:  90 tablet    Refill:  3    Follow-up:  Return in about 3 months (around 04/27/2022).  [place]

## 2022-01-28 NOTE — Assessment & Plan Note (Signed)
A1C today reveals poor control; needs follow up with Endo.

## 2022-01-28 NOTE — Assessment & Plan Note (Signed)
Uncontrolled. Norvasc increased today.

## 2022-01-28 NOTE — Assessment & Plan Note (Signed)
Labs today. Pap smear up to date. Foot exam performed today (see quality metrics section; normal). Advised to get eye exam.

## 2022-02-01 MED ORDER — LISINOPRIL 20 MG PO TABS: 20.0000 mg | ORAL_TABLET | Freq: Every day | ORAL | 0 refills | Status: DC

## 2022-02-01 NOTE — Addendum Note (Signed)
Addended by: Margaretha Sheffield on: 02/01/2022 05:00 PM   Modules accepted: Orders

## 2022-02-01 NOTE — Addendum Note (Signed)
Addended by: Margaretha Sheffield on: 02/01/2022 04:49 PM   Modules accepted: Orders

## 2022-02-10 ENCOUNTER — Ambulatory Visit (HOSPITAL_COMMUNITY)
Admission: RE | Admit: 2022-02-10 | Discharge: 2022-02-10 | Disposition: A | Discharge status: Home / Self Care | Payer: 59 | Source: Ambulatory Visit | Attending: Family Medicine | Admitting: Family Medicine

## 2022-02-10 ENCOUNTER — Other Ambulatory Visit: Payer: Self-pay | Admitting: Family Medicine

## 2022-02-10 DIAGNOSIS — R160 Hepatomegaly, not elsewhere classified: Secondary | ICD-10-CM

## 2022-02-10 DIAGNOSIS — R1900 Intra-abdominal and pelvic swelling, mass and lump, unspecified site: Secondary | ICD-10-CM

## 2022-02-10 DIAGNOSIS — R748 Abnormal levels of other serum enzymes: Secondary | ICD-10-CM | POA: Insufficient documentation

## 2022-02-11 ENCOUNTER — Ambulatory Visit (HOSPITAL_COMMUNITY)
Admission: RE | Admit: 2022-02-11 | Discharge: 2022-02-11 | Disposition: A | Discharge status: Home / Self Care | Payer: 59 | Source: Ambulatory Visit | Attending: Family Medicine | Admitting: Family Medicine

## 2022-02-11 DIAGNOSIS — R160 Hepatomegaly, not elsewhere classified: Secondary | ICD-10-CM | POA: Insufficient documentation

## 2022-02-11 LAB — BASIC METABOLIC PANEL
BUN/Creatinine Ratio: 23 (ref 9–23)
BUN: 17 mg/dL (ref 6–20)
CO2: 22 mmol/L (ref 20–29)
Calcium: 10 mg/dL (ref 8.7–10.2)
Chloride: 100 mmol/L (ref 96–106)
Creatinine, Ser: 0.73 mg/dL (ref 0.57–1.00)
Glucose: 141 mg/dL — ABNORMAL HIGH (ref 70–99)
Potassium: 4.5 mmol/L (ref 3.5–5.2)
Sodium: 139 mmol/L (ref 134–144)
eGFR: 113 mL/min/{1.73_m2} (ref >59)

## 2022-02-11 LAB — GAMMA GT: GGT: 181 IU/L — ABNORMAL HIGH (ref 0–60)

## 2022-02-11 MED ORDER — GADOBUTROL 1 MMOL/ML IV SOLN
10.0000 mL | Freq: Once | INTRAVENOUS | Status: AC | PRN
  Administered 2022-02-11: 10 mL via INTRAVENOUS

## 2022-02-15 ENCOUNTER — Telehealth: Payer: Self-pay | Admitting: *Deleted

## 2022-02-15 NOTE — Telephone Encounter (Signed)
Everlene Other G, DO   Could be from liver issue. Needs Urgent referral to GI ( I believe referral has already been placed). If pain worsens, will need ER evaluation.

## 2022-02-15 NOTE — Telephone Encounter (Signed)
Patient notified of provider's recommendations and patient stated that she took a nap and when she got up the pain was much better for now.

## 2022-02-15 NOTE — Telephone Encounter (Signed)
Patient states she has been having a sharp lingering pain in her upper right side under her breast since yesterday and didn't know if it could be a lingering effect from contrast from MRI or what you think it is or what she should do about it. Patient states she has never had sharp pain like this in that area

## 2022-02-18 ENCOUNTER — Encounter: Payer: Self-pay | Admitting: Internal Medicine

## 2022-03-14 ENCOUNTER — Ambulatory Visit: Payer: 59 | Admitting: Nutrition

## 2022-03-14 ENCOUNTER — Telehealth: Payer: Self-pay | Admitting: Nutrition

## 2022-03-14 NOTE — Telephone Encounter (Signed)
TC to patient based on missed appointment. Multiple canceled/missed appt. She notes she doesn't like to take her meal time insulin nor eat at 8 am because she thinks her BS are much higher the rest of the day and she tends to eat a lot more when she does that. Doesn't want to eat til 10 am and says her BS are better when she doesn't take insulin with that first meal. 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 She denies missing any insulin. Denies that it could be expired insulin. [employer] and BS remain poorly controlled. 11% TIR. 89% TAR.  Estimated ABG is 248 per CGM.  She says the meal time insulin isn't working. Denies consuming any alcohol.  Next appointment with Loree Fee is Feb 20th and she agrees to keep that appointment. GI appt for her elevated liver enzymes is in February. Discussed need for improve BS and eating habits to help with reducing fatty liver.

## 2022-03-22 ENCOUNTER — Telehealth: Payer: 59 | Admitting: Physician Assistant

## 2022-03-22 ENCOUNTER — Encounter: Payer: Self-pay | Admitting: Physician Assistant

## 2022-03-22 DIAGNOSIS — B349 Viral infection, unspecified: Secondary | ICD-10-CM

## 2022-03-22 MED ORDER — BENZONATATE 100 MG PO CAPS: 100.0000 mg | ORAL_CAPSULE | Freq: Three times a day (TID) | ORAL | 0 refills | Status: DC | PRN

## 2022-03-22 MED ORDER — ONDANSETRON 4 MG PO TBDP: 4.0000 mg | ORAL_TABLET | Freq: Three times a day (TID) | ORAL | 0 refills | Status: DC | PRN

## 2022-03-22 MED ORDER — FLUTICASONE PROPIONATE 50 MCG/ACT NA SUSP: 2.0000 | Freq: Every day | NASAL | 0 refills | Status: DC

## 2022-03-22 NOTE — Patient Instructions (Signed)
Melanie Mccoy, thank you for joining Leeanne Rio, PA-C for today's virtual visit.  While this provider is not your primary care provider (PCP), if your PCP is located in our provider database this encounter information will be shared with them immediately following your visit.   [facility] account gives you access to today's visit and all your visits, tests, and labs performed at [facility] " click here if you don't have a [facility] account or go to [email or web address]  Consent: (Patient) Melanie Mccoy provided verbal consent for this virtual visit at the beginning of the encounter.  Current Medications:  Current Outpatient Medications:    amLODipine (NORVASC) 10 MG tablet, Take 1 tablet (10 mg total) by mouth daily., Disp: 90 tablet, Rfl: 3   atorvastatin (LIPITOR) 10 MG tablet, TAKE 1 TABLET(10 MG) BY MOUTH DAILY, Disp: 90 tablet, Rfl: 3   Continuous Blood Gluc Sensor (FREESTYLE LIBRE 3 SENSOR) MISC, PLACE 1 SENSOR ON THE SKIN EVERY 14 DAYS TO CHECK CONTINUOUSLY., Disp: 2 each, Rfl: 2   insulin aspart (FIASP FLEXTOUCH) 100 UNIT/ML FlexTouch Pen, Inject 22-28 Units into the skin 3 (three) times daily before meals., Disp: 60 mL, Rfl: 3   insulin glargine, 2 Unit Dial, (TOUJEO MAX SOLOSTAR) 300 UNIT/ML Solostar Pen, Inject 100 Units into the skin at bedtime., Disp: 30 mL, Rfl: 2   Insulin Pen Needle (BD PEN NEEDLE NANO 2ND GEN) 32G X 4 MM MISC, Use to inject insulin 6 times daily., Disp: 300 each, Rfl: 3   Lancets (ACCU-CHEK SOFT TOUCH) lancets, Use as instructed, Disp: 50 each, Rfl: 12   lisinopril (ZESTRIL) 20 MG tablet, Take 1 tablet (20 mg total) by mouth daily., Disp: 90 tablet, Rfl: 0   metFORMIN (GLUCOPHAGE-XR) 500 MG 24 hr tablet, Take 2 tablets (1,000 mg total) by mouth daily with supper., Disp: 180 tablet, Rfl: 3   norgestimate-ethinyl estradiol (MILI) 0.25-35 MG-MCG tablet, Take 1 tablet by mouth daily., Disp: 28 tablet, Rfl:  11   Semaglutide, 2 MG/DOSE, 8 MG/3ML SOPN, Inject 2 mg as directed once a week., Disp: 6 mL, Rfl: 3   Medications ordered in this encounter:  No orders of the defined types were placed in this encounter.    *If you need refills on other medications prior to your next appointment, please contact your pharmacy*  Follow-Up: Call back or seek an in-person evaluation if the symptoms worsen or if the condition fails to improve as anticipated.  [facility] [phone]  Other Instructions Please keep well-hydrated and get plenty of rest.  Start OTC Vitamin D3 1000 units once daily, Vitamin C 1000 mg daily.  Use the prescribed medications as directed. If symptoms are not turning the corner in the next few days, or anything new/worsening, please let me know.    If you have been instructed to have an in-person evaluation today at a local Urgent Care facility, please use the link below. It will take you to a list of all of our available [facility], including address, phone number and hours of operation. Please do not delay care.  [facility]  If you or a family member do not have a primary care provider, use the link below to schedule a visit and establish care. When you choose a [facility] primary care physician or advanced practice provider, you gain a long-term partner in health. Find a Primary Care Provider  Learn more about [facility]'s in-office and virtual care options: [facility]

## 2022-03-22 NOTE — Progress Notes (Signed)
Virtual Visit Consent   Melanie Mccoy, you are scheduled for a virtual visit with a [facility] provider today. Just as with appointments in the office, your consent must be obtained to participate. Your consent will be active for this visit and any virtual visit you may have with one of our providers in the next 365 days. If you have a MyChart account, a copy of this consent can be sent to you electronically.  As this is a virtual visit, video technology does not allow for your provider to perform a traditional examination. This may limit your provider's ability to fully assess your condition. If your provider identifies any concerns that need to be evaluated in person or the need to arrange testing (such as labs, EKG, etc.), we will make arrangements to do so. Although advances in technology are sophisticated, we cannot ensure that it will always work on either your end or our end. If the connection with a video visit is poor, the visit may have to be switched to a telephone visit. With either a video or telephone visit, we are not always able to ensure that we have a secure connection.  By engaging in this virtual visit, you consent to the provision of healthcare and authorize for your insurance to be billed (if applicable) for the services provided during this visit. Depending on your insurance coverage, you may receive a charge related to this service.  I need to obtain your verbal consent now. Are you willing to proceed with your visit today? Melanie Mccoy has provided verbal consent on 03/22/2022 for a virtual visit (video or telephone). Leeanne Rio, [place]  Date: 03/22/2022 9:48 AM  Virtual Visit via Video Note   I, Leeanne Rio, connected with  Melanie Mccoy  ([id number], 1990/06/14) on 03/22/22 at  9:00 AM EST by a video-enabled telemedicine application and verified that I am speaking with the correct person using two identifiers.  Location: Patient: Virtual  Visit Location Patient: Home Provider: Virtual Visit Location Provider: Home Office   I discussed the limitations of evaluation and management by telemedicine and the availability of in person appointments. The patient expressed understanding and agreed to proceed.    History of Present Illness: Melanie Mccoy is a 32 y.o. who identifies as a female who was assigned female at birth, and is being seen today for symptoms starting last Friday. Notes initially with nasal congestion and rhinorrhea. Saturday around noon noted nausea and vomiting with diarrhea starting overnight. The vomiting stopped Sunday but then developed more nasal congestion and cough with fatigue. Testes for COVID and was negative. Today with sore throat, congestion and continued cough. Some residual nausea. Denies recent travel or known sick contact.   HPI: HPI  Problems:  Patient Active Problem List   Diagnosis Date Noted   Annual physical exam 01/28/2022   Mixed hyperlipidemia 04/14/2021   Severe obesity (BMI 35.0-39.9) with comorbidity ([place]) 04/14/2021   Essential hypertension 09/18/2019   Uncontrolled type 2 diabetes mellitus with hyperglycemia ([place]) 01/19/2016   PCOS (polycystic ovarian syndrome) 04/15/2014    Allergies:  Allergies  Allergen Reactions   Bee Venom Swelling    Progressively worse with each sting Other reaction(s): Other   Nickel Rash and Itching   Ibuprofen Hives   Medications:  Current Outpatient Medications:    benzonatate (TESSALON) 100 MG capsule, Take 1 capsule (100 mg total) by mouth 3 (three) times daily as needed for cough., Disp: 30  capsule, Rfl: 0   fluticasone (FLONASE) 50 MCG/ACT nasal spray, Place 2 sprays into both nostrils daily., Disp: 16 g, Rfl: 0   amLODipine (NORVASC) 10 MG tablet, Take 1 tablet (10 mg total) by mouth daily., Disp: 90 tablet, Rfl: 3   atorvastatin (LIPITOR) 10 MG tablet, TAKE 1 TABLET(10 MG) BY MOUTH DAILY, Disp: 90 tablet, Rfl: 3   Continuous Blood Gluc  Sensor (FREESTYLE LIBRE 3 SENSOR) MISC, PLACE 1 SENSOR ON THE SKIN EVERY 14 DAYS TO CHECK CONTINUOUSLY., Disp: 2 each, Rfl: 2   insulin aspart (FIASP FLEXTOUCH) 100 UNIT/ML FlexTouch Pen, Inject 22-28 Units into the skin 3 (three) times daily before meals., Disp: 60 mL, Rfl: 3   insulin glargine, 2 Unit Dial, (TOUJEO MAX SOLOSTAR) 300 UNIT/ML Solostar Pen, Inject 100 Units into the skin at bedtime., Disp: 30 mL, Rfl: 2   Insulin Pen Needle (BD PEN NEEDLE NANO 2ND GEN) 32G X 4 MM MISC, Use to inject insulin 6 times daily., Disp: 300 each, Rfl: 3   lisinopril (ZESTRIL) 20 MG tablet, Take 1 tablet (20 mg total) by mouth daily., Disp: 90 tablet, Rfl: 0   metFORMIN (GLUCOPHAGE-XR) 500 MG 24 hr tablet, Take 2 tablets (1,000 mg total) by mouth daily with supper., Disp: 180 tablet, Rfl: 3   norgestimate-ethinyl estradiol (MILI) 0.25-35 MG-MCG tablet, Take 1 tablet by mouth daily., Disp: 28 tablet, Rfl: 11   Semaglutide, 2 MG/DOSE, 8 MG/3ML SOPN, Inject 2 mg as directed once a week., Disp: 6 mL, Rfl: 3  Observations/Objective: Patient is well-developed, well-nourished in no acute distress.  Resting comfortably at home.  Head is normocephalic, atraumatic.  No labored breathing. Speech is clear and coherent with logical content.  Patient is alert and oriented at baseline.   Assessment and Plan: 1. Viral illness - fluticasone (FLONASE) 50 MCG/ACT nasal spray; Place 2 sprays into both nostrils daily.  Dispense: 16 g; Refill: 0 - benzonatate (TESSALON) 100 MG capsule; Take 1 capsule (100 mg total) by mouth 3 (three) times daily as needed for cough.  Dispense: 30 capsule; Refill: 0  COVID negative x 2 (retested today). Supportive measures and OTC medications reviewed. Vitamin recommendations discussed. Tessalon and Flonase per orders. Will put Zofran on file to help with lingering nausea.   Follow Up Instructions: I discussed the assessment and treatment plan with the patient. The patient was provided an  opportunity to ask questions and all were answered. The patient agreed with the plan and demonstrated an understanding of the instructions.  A copy of instructions were sent to the patient via MyChart unless otherwise noted below.   The patient was advised to call back or seek an in-person evaluation if the symptoms worsen or if the condition fails to improve as anticipated.  Time:  I spent 10 minutes with the patient via telehealth technology discussing the above problems/concerns.    Leeanne Rio, PA-C

## 2022-04-01 ENCOUNTER — Other Ambulatory Visit: Payer: Self-pay | Admitting: Nurse Practitioner

## 2022-04-07 ENCOUNTER — Ambulatory Visit: Payer: 59 | Admitting: Internal Medicine

## 2022-04-18 ENCOUNTER — Telehealth: Payer: Self-pay | Admitting: Nurse Practitioner

## 2022-04-18 DIAGNOSIS — E1169 Type 2 diabetes mellitus with other specified complication: Secondary | ICD-10-CM

## 2022-04-18 DIAGNOSIS — E119 Type 2 diabetes mellitus without complications: Secondary | ICD-10-CM

## 2022-04-18 NOTE — Telephone Encounter (Signed)
Pt needs labs updated

## 2022-04-18 NOTE — Telephone Encounter (Signed)
Orders updated and sent to [facility].

## 2022-04-19 LAB — LIPID PANEL
Chol/HDL Ratio: 4 ratio (ref 0.0–4.4)
Cholesterol, Total: 135 mg/dL (ref 100–199)
HDL: 34 mg/dL — ABNORMAL LOW (ref >39)
LDL Chol Calc (NIH): 58 mg/dL (ref 0–99)
Triglycerides: 272 mg/dL — ABNORMAL HIGH (ref 0–149)
VLDL Cholesterol Cal: 43 mg/dL — ABNORMAL HIGH (ref 5–40)

## 2022-04-19 LAB — COMPREHENSIVE METABOLIC PANEL
ALT: 25 IU/L (ref 0–32)
AST: 26 IU/L (ref 0–40)
Albumin/Globulin Ratio: 1.9 (ref 1.2–2.2)
Albumin: 4.4 g/dL (ref 4.0–5.0)
Alkaline Phosphatase: 145 IU/L — ABNORMAL HIGH (ref 44–121)
BUN/Creatinine Ratio: 16 (ref 9–23)
BUN: 11 mg/dL (ref 6–20)
Bilirubin Total: 0.3 mg/dL (ref 0.0–1.2)
CO2: 20 mmol/L (ref 20–29)
Calcium: 9.5 mg/dL (ref 8.7–10.2)
Chloride: 102 mmol/L (ref 96–106)
Creatinine, Ser: 0.7 mg/dL (ref 0.57–1.00)
Globulin, Total: 2.3 g/dL (ref 1.5–4.5)
Glucose: 139 mg/dL — ABNORMAL HIGH (ref 70–99)
Potassium: 4 mmol/L (ref 3.5–5.2)
Sodium: 138 mmol/L (ref 134–144)
Total Protein: 6.7 g/dL (ref 6.0–8.5)
eGFR: 119 mL/min/{1.73_m2} (ref >59)

## 2022-04-19 LAB — T4, FREE: Free T4: 1.07 ng/dL (ref 0.82–1.77)

## 2022-04-19 LAB — TSH: TSH: 0.863 u[IU]/mL (ref 0.450–4.500)

## 2022-04-19 LAB — VITAMIN D 25 HYDROXY (VIT D DEFICIENCY, FRACTURES): Vit D, 25-Hydroxy: 26.8 ng/mL — ABNORMAL LOW (ref 30.0–100.0)

## 2022-04-20 ENCOUNTER — Ambulatory Visit (INDEPENDENT_AMBULATORY_CARE_PROVIDER_SITE_OTHER): Payer: 59 | Admitting: Internal Medicine

## 2022-04-20 ENCOUNTER — Encounter: Payer: Self-pay | Admitting: Internal Medicine

## 2022-04-20 VITALS — BP 152/113 | HR 89 | Temp 97.9°F | Ht 64.0 in | Wt 212.6 lb

## 2022-04-20 DIAGNOSIS — K760 Fatty (change of) liver, not elsewhere classified: Secondary | ICD-10-CM

## 2022-04-20 DIAGNOSIS — R7989 Other specified abnormal findings of blood chemistry: Secondary | ICD-10-CM

## 2022-04-20 DIAGNOSIS — K769 Liver disease, unspecified: Secondary | ICD-10-CM | POA: Diagnosis not present

## 2022-04-20 DIAGNOSIS — E6609 Other obesity due to excess calories: Secondary | ICD-10-CM

## 2022-04-20 DIAGNOSIS — I10 Essential (primary) hypertension: Secondary | ICD-10-CM

## 2022-04-20 DIAGNOSIS — Z6836 Body mass index (BMI) 36.0-36.9, adult: Secondary | ICD-10-CM

## 2022-04-20 NOTE — Progress Notes (Signed)
Primary Care Physician:  Coral Spikes, DO Primary Gastroenterologist:  Dr. Abbey Chatters  Chief Complaint  Patient presents with   liver mass    New patient. Referred for liver mass found on MRI.     HPI:   Melanie Mccoy is a 32 y.o. female who presents to clinic today by referral from her PCP Dr. Lacinda Axon for evaluation.  Patient underwent abdominal ultrasound 02/10/2022 due to right upper quadrant pain and abnormal LFTs.  Found to have 3 hypoechoic masses within the liver, recommended MRI to further evaluate.  Subsequent MRI with numerous liver lesions of varying size and characteristic is scattered throughout both hepatic lobes.  Lesions without typical imaging characteristics for any specific etiology, dominant lesion in the inferior right liver demonstrates evidence of intralesional hemorrhage, recommended tissue sampling.  Also with diffuse fatty liver disease.  Risk factors include obesity, diabetes, dyslipidemia.  Patient does note an uncle with liver cancer related to alcohol cirrhosis.  Otherwise no other family history of liver disease.  Blood work 01/25/2022 with abnormal LFTs, AST 50, ALT 37, T. bili 0.2, alk phos 156.  Repeat 04/18/2022 showed normalization of LFTs monitor alk phos which remains elevated at 145.  Patient does note occasional itching on her feet.  Right upper quadrant pain has mostly resolved.  When it did happen, was mild to moderate in severity, sharp/burning.  Gallbladder unremarkable on MRI.  Denies any heartburn, reflux, dysphagia/odynophagia, epigastric pain.  Past Medical History:  Diagnosis Date   Anemia    slightly anemic, iron supplements every 2-3 days   Bartholin cyst    Chlamydia 01/2011   Diabetes mellitus without complication (HCC)    Family history of anesthesia complication    grandmother had spinal with nerve damage   No pertinent past medical history     Past Surgical History:  Procedure Laterality Date   CESAREAN SECTION N/A  11/22/2012   Procedure: PRIMARY CESAREAN SECTION;  Surgeon: Alwyn Pea, MD;  Location: [facility];  Service: Obstetrics;  Laterality: N/A;   CYST REMOVAL TRUNK  2005    Current Outpatient Medications  Medication Sig Dispense Refill   amLODipine (NORVASC) 10 MG tablet Take 1 tablet (10 mg total) by mouth daily. 90 tablet 3   atorvastatin (LIPITOR) 10 MG tablet TAKE 1 TABLET(10 MG) BY MOUTH DAILY 90 tablet 3   Continuous Blood Gluc Sensor (FREESTYLE LIBRE 3 SENSOR) MISC PLACE 1 SENSOR ON THE SKIN EVERY 14 DAYS TO CHECK CONTINUOUSLY. 2 each 0   insulin aspart (FIASP FLEXTOUCH) 100 UNIT/ML FlexTouch Pen Inject 22-28 Units into the skin 3 (three) times daily before meals. 60 mL 3   insulin glargine, 2 Unit Dial, (TOUJEO MAX SOLOSTAR) 300 UNIT/ML Solostar Pen Inject 100 Units into the skin at bedtime. 30 mL 2   Insulin Pen Needle (BD PEN NEEDLE NANO 2ND GEN) 32G X 4 MM MISC Use to inject insulin 6 times daily. 300 each 3   lisinopril (ZESTRIL) 20 MG tablet Take 1 tablet (20 mg total) by mouth daily. 90 tablet 0   metFORMIN (GLUCOPHAGE-XR) 500 MG 24 hr tablet Take 2 tablets (1,000 mg total) by mouth daily with supper. 180 tablet 3   norgestimate-ethinyl estradiol (MILI) 0.25-35 MG-MCG tablet Take 1 tablet by mouth daily. 28 tablet 11   Semaglutide, 2 MG/DOSE, 8 MG/3ML SOPN Inject 2 mg as directed once a week. 6 mL 3   No current facility-administered medications for this visit.    Allergies as of  04/20/2022 - Review Complete 04/20/2022  Allergen Reaction Noted   Bee venom Swelling 09/21/2013   Nickel Rash and Itching 02/11/2011   Ibuprofen Hives 03/21/2016    Family History  Problem Relation Age of Onset   Cancer Other    Heart disease Other    Diabetes Other    Diabetes Mother    Diabetes Father    Diabetes Maternal Grandfather     Social History   Socioeconomic History   Marital status: Married    Spouse name: Not on file   Number of children: Not on file   Years of education:  Not on file   Highest education level: Not on file  Occupational History   Not on file  Tobacco Use   Smoking status: Never    Passive exposure: Never   Smokeless tobacco: Never  Vaping Use   Vaping Use: Never used  Substance and Sexual Activity   Alcohol use: No    Comment: Occassionally   Drug use: No   Sexual activity: Yes  Other Topics Concern   Not on file  Social History Narrative   Not on file   Social Determinants of Health   Financial Resource Strain: Not on file  Food Insecurity: Not on file  Transportation Needs: Not on file  Physical Activity: Not on file  Stress: Not on file  Social Connections: Not on file  Intimate Partner Violence: Not on file    Subjective: Review of Systems  Constitutional:  Negative for chills and fever.  HENT:  Negative for congestion and hearing loss.   Eyes:  Negative for blurred vision and double vision.  Respiratory:  Negative for cough and shortness of breath.   Cardiovascular:  Negative for chest pain and palpitations.  Gastrointestinal:  Negative for abdominal pain, blood in stool, constipation, diarrhea, heartburn, melena and vomiting.  Genitourinary:  Negative for dysuria and urgency.  Musculoskeletal:  Negative for joint pain and myalgias.  Skin:  Negative for itching and rash.  Neurological:  Negative for dizziness and headaches.  Psychiatric/Behavioral:  Negative for depression. The patient is not nervous/anxious.        Objective: BP (!) 152/113 (BP Location: Right Arm, Patient Position: Sitting, Cuff Size: Large)   Pulse 89   Temp 97.9 F (36.6 C) (Oral)   Ht 5' 4"$  (1.626 m)   Wt 212 lb 9.6 oz (96.4 kg)   BMI 36.49 kg/m  Physical Exam Constitutional:      Appearance: Normal appearance. She is obese.  HENT:     Head: Normocephalic and atraumatic.  Eyes:     Extraocular Movements: Extraocular movements intact.     Conjunctiva/sclera: Conjunctivae normal.  Cardiovascular:     Rate and Rhythm: Normal rate  and regular rhythm.  Pulmonary:     Effort: Pulmonary effort is normal.     Breath sounds: Normal breath sounds.  Abdominal:     General: Bowel sounds are normal.     Palpations: Abdomen is soft.  Musculoskeletal:        General: No swelling. Normal range of motion.     Cervical back: Normal range of motion and neck supple.  Skin:    General: Skin is warm and dry.     Coloration: Skin is not jaundiced.  Neurological:     General: No focal deficit present.     Mental Status: She is alert and oriented to person, place, and time.  Psychiatric:        Mood and Affect:  Mood normal.        Behavior: Behavior normal.      Assessment: *Liver lesions-multiple, etiology unclear *Nonalcoholic steatohepatitis *Abnormal liver function test *Obesity *Hypertension  Plan: Etiology of patient's liver lesions unclear.  Radiology recommended biopsy which we will order today to further evaluate.  Patient at risk for adenoma's given her obesity and estrogen containing OCP x 9 years.  Consider switching to progesterone only containing OCP pending biopsy results.  Discussed fatty liver in depth with patient today.  Her risk factors include obesity, diabetes, cholesterol.  Recommend 1-2# weight loss per week until ideal body weight through exercise & diet. Low fat/cholesterol diet.   Avoid sweets, sodas, fruit juices, sweetened beverages like tea, etc. Gradually increase exercise from 15 min daily up to 1 hr per day 5 days/week. Limit alcohol use.  Will perform serological workup today of her abnormal LFTs.  Will also check AFP.  Call with results.  The patient was found to have elevated blood pressure when vital signs were checked in the office. The blood pressure was rechecked by the nursing staff, and it was found be persistently elevated >140/90 mmHg. I personally advised the patient to follow up closely with the PCP for hypertension control.  Thank you Dr. Lacinda Axon for the kind  referral.    04/20/2022 8:33 AM   Disclaimer: This note was dictated with voice recognition software. Similar sounding words can inadvertently be transcribed and may not be corrected upon review.

## 2022-04-20 NOTE — Patient Instructions (Signed)
I am going to check blood work today at [facility] to further evaluate your abnormal liver function test, liver lesions, fatty liver.  I am also going to order a liver biopsy today.  We will call with these results.  Recommend 1-2# weight loss per week until ideal body weight through exercise & diet. Low fat/cholesterol diet.   Avoid sweets, sodas, fruit juices, sweetened beverages like tea, etc. Gradually increase exercise from 15 min daily up to 1 hr per day 5 days/week. Limit alcohol use.  Keep diabetes and high cholesterol under good control.  Follow-up in 3 months.  It was very nice meeting you today.  Dr. Abbey Chatters

## 2022-04-21 ENCOUNTER — Encounter: Payer: Self-pay | Admitting: General Practice

## 2022-04-21 NOTE — Progress Notes (Signed)
Suttle, Rosanne Ashing, MD  Allen Kell, NT Approved for ultrasound guided liver mass biopsy.  Would recommend sampling enhancing mass in segment IV (MRI 02/11/22, series 14, image 44).  Contrast enhance ultrasound could be considered for increased diagnostic specificity.  Dylan       Previous Messages    ----- Message ----- From: Allen Kell, NT Sent: 04/20/2022   9:31 AM EST To: [facility] Procedure Requests Subject: US BIOPSY (LIVER)                              Procedure: US Biopsy Liver  Reason: Lesion of liver greater than 1 cm in diameter [K76.9 (ICD-10-CM)]; NAFLD (nonalcoholic fatty liver disease) [K76.0 (ICD-10-CM)]; Abnormal LFTs  History: MRI & [place] in chart  Provider: Eloise Harman, DO  Contact: [phone]

## 2022-04-24 LAB — CELIAC AB TTG DGP TIGA
Antigliadin Abs, IgA: 3 units (ref 0–19)
Gliadin IgG: 3 units (ref 0–19)
IgA/Immunoglobulin A, Serum: 153 mg/dL (ref 87–352)
Tissue Transglut Ab: 3 U/mL (ref 0–5)
Transglutaminase IgA: <2 U/mL (ref 0–3)

## 2022-04-24 LAB — IRON,TIBC AND FERRITIN PANEL
Ferritin: 26 ng/mL (ref 15–150)
Iron Saturation: 12 % — ABNORMAL LOW (ref 15–55)
Iron: 53 ug/dL (ref 27–159)
Total Iron Binding Capacity: 430 ug/dL (ref 250–450)
UIBC: 377 ug/dL (ref 131–425)

## 2022-04-24 LAB — ACUTE VIRAL HEPATITIS (HAV, HBV, HCV)
HCV Ab: NONREACTIVE
Hep A IgM: NEGATIVE
Hep B C IgM: NEGATIVE
Hepatitis B Surface Ag: NEGATIVE

## 2022-04-24 LAB — HCV INTERPRETATION

## 2022-04-24 LAB — ANA: ANA Titer 1: NEGATIVE

## 2022-04-24 LAB — IMMUNOGLOBULINS A/E/G/M, SERUM
IgE (Immunoglobulin E), Serum: 450 IU/mL (ref 6–495)
IgG (Immunoglobin G), Serum: 664 mg/dL (ref 586–1602)
IgM (Immunoglobulin M), Srm: 109 mg/dL (ref 26–217)

## 2022-04-24 LAB — AFP TUMOR MARKER: AFP, Serum, Tumor Marker: 2.4 ng/mL (ref 0.0–4.7)

## 2022-04-24 LAB — ALPHA-1-ANTITRYPSIN: A-1 Antitrypsin: 195 mg/dL — ABNORMAL HIGH (ref 100–188)

## 2022-04-24 LAB — MITOCHONDRIAL ANTIBODIES: Mitochondrial Ab: <20 Units (ref 0.0–20.0)

## 2022-04-24 LAB — ANTI-SMOOTH MUSCLE ANTIBODY, IGG: Smooth Muscle Ab: 3 Units (ref 0–19)

## 2022-04-25 ENCOUNTER — Encounter: Payer: Self-pay | Admitting: Internal Medicine

## 2022-04-25 ENCOUNTER — Ambulatory Visit: Payer: 59 | Admitting: Family Medicine

## 2022-04-25 ENCOUNTER — Ambulatory Visit: Payer: 59 | Admitting: Nurse Practitioner

## 2022-04-25 ENCOUNTER — Encounter: Payer: Self-pay | Admitting: Nurse Practitioner

## 2022-04-25 VITALS — BP 140/94 | HR 86 | Ht 64.0 in | Wt 214.0 lb

## 2022-04-25 DIAGNOSIS — K760 Fatty (change of) liver, not elsewhere classified: Secondary | ICD-10-CM | POA: Diagnosis not present

## 2022-04-25 DIAGNOSIS — E1169 Type 2 diabetes mellitus with other specified complication: Secondary | ICD-10-CM

## 2022-04-25 DIAGNOSIS — I10 Essential (primary) hypertension: Secondary | ICD-10-CM

## 2022-04-25 DIAGNOSIS — E119 Type 2 diabetes mellitus without complications: Secondary | ICD-10-CM | POA: Diagnosis not present

## 2022-04-25 DIAGNOSIS — E785 Hyperlipidemia, unspecified: Secondary | ICD-10-CM

## 2022-04-25 DIAGNOSIS — Z794 Long term (current) use of insulin: Secondary | ICD-10-CM | POA: Diagnosis not present

## 2022-04-25 DIAGNOSIS — E1165 Type 2 diabetes mellitus with hyperglycemia: Secondary | ICD-10-CM | POA: Diagnosis not present

## 2022-04-25 DIAGNOSIS — K769 Liver disease, unspecified: Secondary | ICD-10-CM | POA: Diagnosis not present

## 2022-04-25 MED ORDER — FIASP FLEXTOUCH 100 UNIT/ML ~~LOC~~ SOPN: 25.0000 [IU] | PEN_INJECTOR | Freq: Three times a day (TID) | SUBCUTANEOUS | 3 refills | Status: DC

## 2022-04-25 MED ORDER — FREESTYLE LIBRE 3 SENSOR MISC: 3 refills | Status: DC

## 2022-04-25 MED ORDER — TOUJEO MAX SOLOSTAR 300 UNIT/ML ~~LOC~~ SOPN: 100.0000 [IU] | PEN_INJECTOR | Freq: Every evening | SUBCUTANEOUS | 2 refills | Status: DC

## 2022-04-25 MED ORDER — SEMAGLUTIDE (2 MG/DOSE) 8 MG/3ML ~~LOC~~ SOPN: 2.0000 mg | PEN_INJECTOR | SUBCUTANEOUS | 3 refills | Status: DC

## 2022-04-25 MED ORDER — LISINOPRIL 40 MG PO TABS: 40.0000 mg | ORAL_TABLET | Freq: Every day | ORAL | 3 refills | Status: DC

## 2022-04-25 MED ORDER — BD PEN NEEDLE NANO 2ND GEN 32G X 4 MM MISC: 3 refills | Status: DC

## 2022-04-25 MED ORDER — METFORMIN HCL ER 500 MG PO TB24: 1000.0000 mg | ORAL_TABLET | Freq: Every day | ORAL | 3 refills | Status: DC

## 2022-04-25 NOTE — Patient Instructions (Signed)
I have increased your lisinopril.  You need 1 lab draw in 2 weeks.  Follow-up in 1 month for reevaluation of your hypertension.  Take care  Dr. Lacinda Axon

## 2022-04-25 NOTE — Assessment & Plan Note (Signed)
Has upcoming biopsy.

## 2022-04-25 NOTE — Assessment & Plan Note (Signed)
Uncontrolled/worsening.  Increasing lisinopril to 40 mg daily.  Metabolic panel in the next 7 to 10 days.

## 2022-04-25 NOTE — Progress Notes (Signed)
Subjective:  Patient ID: Melanie Mccoy, female    DOB: 12/10/1990  Age: 32 y.o. MRN: [id number]  CC: Chief Complaint  Patient presents with   Diabetes   Hypertension    HPI:  32 year old female with hypertension, uncontrolled type 2 diabetes, PCOS, severe obesity, hyperlipidemia presents for follow-up.  Diabetes remains uncontrolled.  She is following with endocrinology.  I believe that noncompliance and dietary indiscretion is primarily the reason why she has difficulty getting at goal.  Last A1c 10.3.  She is on basal and bolus insulin as well as semaglutide.  Patient's hypertension is uncontrolled.  BP 150/99 today.  She is currently on lisinopril 20 mg daily and amlodipine 10 mg daily.  She endorses compliance.  Patient's LDL is at goal.  Triglycerides are elevated.  This is likely due to underlying uncontrolled type 2 diabetes.  Patient has underlying hepatic steatosis.  She has had an abnormal ultrasound and abnormal MRI of the abdomen.  She is scheduled for a biopsy.  Patient Active Problem List   Diagnosis Date Noted   Hepatic steatosis 04/25/2022   Liver lesion 04/25/2022   Annual physical exam 01/28/2022   Mixed hyperlipidemia 04/14/2021   Severe obesity (BMI 35.0-39.9) with comorbidity ([place]) 04/14/2021   Essential hypertension 09/18/2019   Uncontrolled type 2 diabetes mellitus with hyperglycemia ([place]) 01/19/2016   PCOS (polycystic ovarian syndrome) 04/15/2014    Social Hx   Social History   Socioeconomic History   Marital status: Married    Spouse name: Not on file   Number of children: Not on file   Years of education: Not on file   Highest education level: Not on file  Occupational History   Not on file  Tobacco Use   Smoking status: Never    Passive exposure: Never   Smokeless tobacco: Never  Vaping Use   Vaping Use: Never used  Substance and Sexual Activity   Alcohol use: No    Comment: Occassionally   Drug use: No   Sexual activity: Yes   Other Topics Concern   Not on file  Social History Narrative   Not on file   Social Determinants of Health   Financial Resource Strain: Not on file  Food Insecurity: Not on file  Transportation Needs: Not on file  Physical Activity: Not on file  Stress: Not on file  Social Connections: Not on file    Review of Systems  Constitutional: Negative.   Respiratory: Negative.    Cardiovascular: Negative.    Objective:  BP (!) 150/99   Pulse 99   Temp 97.7 F (36.5 C)   Wt 214 lb (97.1 kg)   SpO2 99%   BMI 36.73 kg/m      04/25/2022    9:44 AM 04/25/2022    9:37 AM 04/25/2022    8:26 AM  BP/Weight  Systolic BP [id number]  Diastolic BP 94 89 99  Wt. (Lbs)  214 214  BMI  36.73 kg/m2 36.73 kg/m2    Physical Exam Vitals and nursing note reviewed.  Constitutional:      Appearance: Normal appearance. She is obese.  HENT:     Head: Normocephalic and atraumatic.  Cardiovascular:     Rate and Rhythm: Normal rate and regular rhythm.  Pulmonary:     Effort: Pulmonary effort is normal.     Breath sounds: Normal breath sounds. No wheezing or rales.  Neurological:     Mental Status: She is alert.  Psychiatric:  Mood and Affect: Mood normal.        Behavior: Behavior normal.     Lab Results  Component Value Date   WBC 12.4 (H) 01/25/2022   HGB 15.7 01/25/2022   HCT 48.8 (H) 01/25/2022   PLT 430 01/25/2022   GLUCOSE 139 (H) 04/18/2022   CHOL 135 04/18/2022   TRIG 272 (H) 04/18/2022   HDL 34 (L) 04/18/2022   LDLCALC 58 04/18/2022   ALT 25 04/18/2022   AST 26 04/18/2022   NA 138 04/18/2022   K 4.0 04/18/2022   CL 102 04/18/2022   CREATININE 0.70 04/18/2022   BUN 11 04/18/2022   CO2 20 04/18/2022   TSH 0.863 04/18/2022   INR 1.15 02/11/2011   HGBA1C 10.3 (H) 01/25/2022   MICROALBUR 80 01/01/2021     Assessment & Plan:   Problem List Items Addressed This Visit       Cardiovascular and Mediastinum   Essential hypertension     Uncontrolled/worsening.  Increasing lisinopril to 40 mg daily.  Metabolic panel in the next 7 to 10 days.      Relevant Medications   lisinopril (ZESTRIL) 40 MG tablet     Digestive   Liver lesion    Has upcoming biopsy.      Hepatic steatosis     Endocrine   Uncontrolled type 2 diabetes mellitus with hyperglycemia (HCC)    Remains uncontrolled.  Advised close follow-up with endocrinology.      Relevant Medications   lisinopril (ZESTRIL) 40 MG tablet   Other Relevant Orders   Basic Metabolic Panel    Meds ordered this encounter  Medications   lisinopril (ZESTRIL) 40 MG tablet    Sig: Take 1 tablet (40 mg total) by mouth daily.    Dispense:  90 tablet    Refill:  3    Change in dose -please d/c other script    Follow-up:  Return in about 1 month (around 05/24/2022).  [place]

## 2022-04-25 NOTE — Assessment & Plan Note (Signed)
Remains uncontrolled.  Advised close follow-up with endocrinology.

## 2022-04-25 NOTE — Progress Notes (Signed)
Endocrinology Follow Up Visit       04/25/2022, 12:02 PM   Subjective:    Patient ID: Melanie Mccoy, female    DOB: Sep 13, 1990.  Melanie Mccoy is being seen in follow up after being seen in consultation for management of currently uncontrolled symptomatic diabetes requested by  Coral Spikes, DO.   Past Medical History:  Diagnosis Date   Anemia    slightly anemic, iron supplements every 2-3 days   Bartholin cyst    Chlamydia 01/2011   Diabetes mellitus without complication (HCC)    Family history of anesthesia complication    grandmother had spinal with nerve damage   No pertinent past medical history     Past Surgical History:  Procedure Laterality Date   CESAREAN SECTION N/A 11/22/2012   Procedure: PRIMARY CESAREAN SECTION;  Surgeon: Alwyn Pea, MD;  Location: [facility];  Service: Obstetrics;  Laterality: N/A;   CYST REMOVAL TRUNK  2005    Social History   Socioeconomic History   Marital status: Married    Spouse name: Not on file   Number of children: Not on file   Years of education: Not on file   Highest education level: Not on file  Occupational History   Not on file  Tobacco Use   Smoking status: Never    Passive exposure: Never   Smokeless tobacco: Never  Vaping Use   Vaping Use: Never used  Substance and Sexual Activity   Alcohol use: No    Comment: Occassionally   Drug use: No   Sexual activity: Yes  Other Topics Concern   Not on file  Social History Narrative   Not on file   Social Determinants of Health   Financial Resource Strain: Not on file  Food Insecurity: Not on file  Transportation Needs: Not on file  Physical Activity: Not on file  Stress: Not on file  Social Connections: Not on file    Family History  Problem Relation Age of Onset   Cancer Other    Heart disease Other    Diabetes Other    Diabetes Mother    Diabetes Father    Diabetes Maternal  Grandfather     Outpatient Encounter Medications as of 04/25/2022  Medication Sig   amLODipine (NORVASC) 10 MG tablet Take 1 tablet (10 mg total) by mouth daily.   atorvastatin (LIPITOR) 10 MG tablet TAKE 1 TABLET(10 MG) BY MOUTH DAILY   lisinopril (ZESTRIL) 40 MG tablet Take 1 tablet (40 mg total) by mouth daily.   norgestimate-ethinyl estradiol (MILI) 0.25-35 MG-MCG tablet Take 1 tablet by mouth daily.   [DISCONTINUED] Continuous Blood Gluc Sensor (FREESTYLE LIBRE 3 SENSOR) MISC PLACE 1 SENSOR ON THE SKIN EVERY 14 DAYS TO CHECK CONTINUOUSLY.   [DISCONTINUED] insulin aspart (FIASP FLEXTOUCH) 100 UNIT/ML FlexTouch Pen Inject 22-28 Units into the skin 3 (three) times daily before meals.   [DISCONTINUED] insulin glargine, 2 Unit Dial, (TOUJEO MAX SOLOSTAR) 300 UNIT/ML Solostar Pen Inject 100 Units into the skin at bedtime.   [DISCONTINUED] Insulin Pen Needle (BD PEN NEEDLE NANO 2ND GEN) 32G X 4 MM MISC Use to inject insulin 6 times daily.   [DISCONTINUED]  metFORMIN (GLUCOPHAGE-XR) 500 MG 24 hr tablet Take 2 tablets (1,000 mg total) by mouth daily with supper.   [DISCONTINUED] Semaglutide, 2 MG/DOSE, 8 MG/3ML SOPN Inject 2 mg as directed once a week.   Continuous Blood Gluc Sensor (FREESTYLE LIBRE 3 SENSOR) MISC Use to check glucose continuously   insulin aspart (FIASP FLEXTOUCH) 100 UNIT/ML FlexTouch Pen Inject 25-31 Units into the skin 3 (three) times daily before meals.   insulin glargine, 2 Unit Dial, (TOUJEO MAX SOLOSTAR) 300 UNIT/ML Solostar Pen Inject 100 Units into the skin at bedtime.   Insulin Pen Needle (BD PEN NEEDLE NANO 2ND GEN) 32G X 4 MM MISC Use to inject insulin 6 times daily.   metFORMIN (GLUCOPHAGE-XR) 500 MG 24 hr tablet Take 2 tablets (1,000 mg total) by mouth daily with supper.   Semaglutide, 2 MG/DOSE, 8 MG/3ML SOPN Inject 2 mg as directed once a week.   [DISCONTINUED] lisinopril (ZESTRIL) 20 MG tablet Take 1 tablet (20 mg total) by mouth daily.   No facility-administered  encounter medications on file as of 04/25/2022.    ALLERGIES: Allergies  Allergen Reactions   Bee Venom Swelling    Progressively worse with each sting Other reaction(s): Other   Nickel Rash and Itching   Ibuprofen Hives    VACCINATION STATUS: Immunization History  Administered Date(s) Administered   Tdap 06/26/2006    Diabetes She presents for her follow-up diabetic visit. She has type 2 diabetes mellitus. Onset time: She was diagnosed at approx age of 61. Her disease course has been improving. There are no hypoglycemic associated symptoms. Associated symptoms include fatigue. Pertinent negatives for diabetes include no blurred vision, no polydipsia, no polyphagia and no polyuria. There are no hypoglycemic complications. Symptoms are stable. There are no diabetic complications. Risk factors for coronary artery disease include diabetes mellitus, dyslipidemia, hypertension, obesity, sedentary lifestyle, family history and stress. Current diabetic treatment includes oral agent (dual therapy) and intensive insulin program (and Ozempic). She is compliant with treatment most of the time (forgets her Metformin and Glipizide routinely). Her weight is stable. She is following a generally unhealthy diet. When asked about meal planning, she reported none. She has had a previous visit with a dietitian. She participates in exercise intermittently. Her home blood glucose trend is decreasing steadily. Her overall blood glucose range is >200 mg/dl. (She presents today with her CGM showing improved yet still above target glycemic profile overall  She was not quite due for A1c today but her CGM estimated GMI was 8.6% with TIR 30%, TAR 70%, TBR 0 %.  She admits that she tends to eat more greasy food when she works at [employer] on weekends.  She also says she has been trying intermittent fasting.) An ACE inhibitor/angiotensin II receptor blocker is being taken. She does not see a podiatrist.Eye exam is current.   Hypertension This is a chronic problem. The current episode started more than 1 year ago. The problem has been gradually improving since onset. The problem is uncontrolled. Pertinent negatives include no blurred vision. There are no associated agents to hypertension. Risk factors for coronary artery disease include diabetes mellitus, dyslipidemia, obesity, sedentary lifestyle and stress. Past treatments include calcium channel blockers and ACE inhibitors. The current treatment provides mild improvement. Compliance problems include exercise and diet.   Hyperlipidemia This is a chronic problem. The current episode started more than 1 year ago. The problem is uncontrolled. Recent lipid tests were reviewed and are high. Exacerbating diseases include diabetes and obesity. Factors aggravating  her hyperlipidemia include fatty foods. Current antihyperlipidemic treatment includes statins. The current treatment provides mild improvement of lipids. Compliance problems include adherence to diet and adherence to exercise.  Risk factors for coronary artery disease include diabetes mellitus, dyslipidemia, hypertension, obesity, a sedentary lifestyle and stress.    Review of systems  Constitutional: +stable body weight,  current Body mass index is 36.73 kg/m. , no fatigue, no subjective hyperthermia, no subjective hypothermia Eyes: no blurry vision, no xerophthalmia ENT: no sore throat, no nodules palpated in throat, no dysphagia/odynophagia, no hoarseness Cardiovascular: no chest pain, no shortness of breath, no palpitations, no leg swelling Respiratory: no cough, no shortness of breath Gastrointestinal: no nausea/vomiting/diarrhea Musculoskeletal: no muscle/joint aches Skin: no rashes, no hyperemia Neurological: no tremors, no numbness, no tingling, no dizziness Psychiatric: no depression, no anxiety  Objective:     BP (!) 140/94 (BP Location: Left Arm, Patient Position: Sitting, Cuff Size: Normal)  Comment: Retake Manuel Cuff - patient just left PCP and he increased her BP medicaiton  Pulse 86   Ht 5' 4"$  (1.626 m)   Wt 214 lb (97.1 kg)   BMI 36.73 kg/m   Wt Readings from Last 3 Encounters:  04/25/22 214 lb (97.1 kg)  04/25/22 214 lb (97.1 kg)  04/20/22 212 lb 9.6 oz (96.4 kg)    BP Readings from Last 3 Encounters:  04/25/22 (!) 140/94  04/25/22 (!) 150/99  04/20/22 (!) 152/113     Physical Exam- Limited  Constitutional:  Body mass index is 36.73 kg/m. , not in acute distress, normal state of mind Eyes:  EOMI, no exophthalmos Musculoskeletal: no gross deformities, strength intact in all four extremities, no gross restriction of joint movements Skin:  no rashes, no hyperemia Neurological: no tremor with outstretched hands    CMP ( most recent) CMP     Component Value Date/Time   NA 138 04/18/2022 1217   K 4.0 04/18/2022 1217   CL 102 04/18/2022 1217   CO2 20 04/18/2022 1217   GLUCOSE 139 (H) 04/18/2022 1217   GLUCOSE 318 (H) 07/10/2019 0506   BUN 11 04/18/2022 1217   CREATININE 0.70 04/18/2022 1217   CREATININE 0.77 01/14/2016 1514   CALCIUM 9.5 04/18/2022 1217   PROT 6.7 04/18/2022 1217   ALBUMIN 4.4 04/18/2022 1217   AST 26 04/18/2022 1217   ALT 25 04/18/2022 1217   ALKPHOS 145 (H) 04/18/2022 1217   BILITOT 0.3 04/18/2022 1217   GFRNONAA 118 01/29/2020 0926   GFRNONAA >89 01/14/2016 1514   GFRAA 136 01/29/2020 0926   GFRAA >89 01/14/2016 1514     Diabetic Labs (most recent): Lab Results  Component Value Date   HGBA1C 10.3 (H) 01/25/2022   HGBA1C 9.2 09/13/2021   HGBA1C 9.0 (A) 06/14/2021   MICROALBUR 80 01/01/2021     Lipid Panel ( most recent) Lipid Panel     Component Value Date/Time   CHOL 135 04/18/2022 1217   TRIG 272 (H) 04/18/2022 1217   HDL 34 (L) 04/18/2022 1217   CHOLHDL 4.0 04/18/2022 1217   CHOLHDL 5.1 07/10/2019 0506   VLDL 32 07/10/2019 0506   LDLCALC 58 04/18/2022 1217   LABVLDL 43 (H) 04/18/2022 1217      Lab  Results  Component Value Date   TSH 0.863 04/18/2022   TSH 3.180 12/31/2020   TSH 2.00 01/14/2016   FREET4 1.07 04/18/2022   FREET4 1.23 12/31/2020           Assessment & Plan:   1) Type  2 diabetes mellitus without complication, without long-term current use of insulin ([place])  - Mahum B Aviv Salz has currently uncontrolled symptomatic type 2 DM since 32 years of age.   Recent labs reviewed.  She presents today with her CGM showing improved yet still above target glycemic profile overall  She was not quite due for A1c today but her CGM estimated GMI was 8.6% with TIR 30%, TAR 70%, TBR 0 %.  She admits that she tends to eat more greasy food when she works at [employer] on weekends.  She also says she has been trying intermittent fasting.  - I had a long discussion with her about the progressive nature of diabetes and the pathology behind its complications.  -She does not currently have any complications from diabetes but she remains at a high risk for more acute and chronic complications which include CAD, CVA, CKD, retinopathy, and neuropathy. These are all discussed in detail with her.  - Nutritional counseling repeated at each appointment due to patients tendency to fall back in to old habits.  - The patient admits there is a room for improvement in their diet and drink choices. -  Suggestion is made for the patient to avoid simple carbohydrates from their diet including Cakes, Sweet Desserts / Pastries, Ice Cream, Soda (diet and regular), Sweet Tea, Candies, Chips, Cookies, Sweet Pastries, Store Bought Juices, Alcohol in Excess of 1-2 drinks a day, Artificial Sweeteners, Coffee Creamer, and "Sugar-free" Products. This will help patient to have stable blood glucose profile and potentially avoid unintended weight gain.   - I encouraged the patient to switch to unprocessed or minimally processed complex starch and increased protein intake (animal or plant source), fruits, and  vegetables.   - Patient is advised to stick to a routine mealtimes to eat 3 meals a day and avoid unnecessary snacks (to snack only to correct hypoglycemia).  - she has been scheduled with Jearld Fenton, RDN, CDE for diabetes education- sees her after her visit with me today.  - I have approached her with the following individualized plan to manage  her diabetes and patient agrees:   -She is advised to continue her current dose of Toujeo 100 units SQ nightly, increase Fiasp to 25-31 units TID with meals if glucose is above 90 and she is eating (Specific instructions on how to titrate insulin dosage based on glucose readings given to patient in writing).  She can also continue Ozempic 2 mg SQ weekly and Metformin 500 mg ER twice daily after meals (to avoid GI upset).  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  -she is encouraged to consistently monitor blood glucose 4 times daily (using her CGM), and to call the clinic if she has readings less than 70 or above 300 for 3 tests in a row.  - she is warned not to take insulin without proper monitoring per orders. - Adjustment parameters are given to her for hypo and hyperglycemia in writing.  - Specific targets for  A1c;  LDL, HDL,  and Triglycerides were discussed with the patient.  2) Blood Pressure /Hypertension:  Her blood pressure is not controlled to target.  She is advised to continue Amlodipine 10 mg po daily, and Lisinopril 40 mg po daily.   3) Lipids/Hyperlipidemia:    Her most recent lipid panel from 04/18/22 shows improved and controlled LDL of 58 and  elevated but improved triglycerides of 272.   She is advised to continue Lipitor 10 mg po daily at bedtime.  Side effects and precautions discussed with her.  She was advised to avoid fried foods and butter.    4)  Weight/Diet:  her Body mass index is 36.73 kg/m.  - clearly  complicating her diabetes care.   she is a candidate for weight loss. I discussed with her the fact that loss of 5 - 10% of her  current body weight will have the most impact on her diabetes management.  Exercise, and detailed carbohydrates information provided  -  detailed on discharge instructions.  5) Chronic Care/Health Maintenance: -she is on ACEI/ARB and Statin medications and is encouraged to initiate and continue to follow up with Ophthalmology, Dentist,  Podiatrist at least yearly or according to recommendations, and advised to stay away from smoking. I have recommended yearly flu vaccine and pneumonia vaccine at least every 5 years; moderate intensity exercise for up to 150 minutes weekly; and  sleep for at least 7 hours a day.  - she is advised to maintain close follow up with Coral Spikes, DO for primary care needs, as well as her other providers for optimal and coordinated care.      I spent  41  minutes in the care of the patient today including review of labs from [facility], Lipids, Thyroid Function, Hematology (current and previous including abstractions from other facilities); face-to-face time discussing  her blood glucose readings/logs, discussing hypoglycemia and hyperglycemia episodes and symptoms, medications doses, her options of short and long term treatment based on the latest standards of care / guidelines;  discussion about incorporating lifestyle medicine;  and documenting the encounter. Risk reduction counseling performed per USPSTF guidelines to reduce obesity and cardiovascular risk factors.     Please refer to Patient Instructions for Blood Glucose Monitoring and Insulin/Medications Dosing Guide"  in media tab for additional information. Please  also refer to " Patient Self Inventory" in the Media  tab for reviewed elements of pertinent patient history.  Netta Cedars Sams participated in the discussions, expressed understanding, and voiced agreement with the above plans.   All questions were answered to her satisfaction. she is encouraged to contact clinic should she have any questions or concerns prior to her return visit.     Follow up plan: - Return in about 4 months (around 08/24/2022) for Diabetes F/U with A1c in office, No previsit labs, Bring meter and logs.  Rayetta Pigg, [facility] [address] [place], [place] [address] Phone: [phone] Fax: [phone]  04/25/2022, 12:02 PM

## 2022-04-26 ENCOUNTER — Ambulatory Visit: Payer: 59 | Admitting: Family Medicine

## 2022-04-26 ENCOUNTER — Ambulatory Visit: Payer: 59 | Admitting: Nurse Practitioner

## 2022-05-03 ENCOUNTER — Ambulatory Visit (HOSPITAL_COMMUNITY): Payer: 59

## 2022-05-03 ENCOUNTER — Other Ambulatory Visit: Payer: Self-pay | Admitting: Radiology

## 2022-05-03 DIAGNOSIS — R160 Hepatomegaly, not elsewhere classified: Secondary | ICD-10-CM

## 2022-05-03 NOTE — Progress Notes (Incomplete)
Chief Complaint: Patient was seen in consultation today for image guided liver mass biopsy  Referring Physician(s): Skidmore K  Supervising Physician: Aletta Edouard  Patient Status: [facility] - Out-pt  History of Present Illness: Melanie Mccoy is a 32 y.o. female with past medical history of anemia, diabetes, hypertension, hyperlipidemia, obesity, fatty liver who underwent abdominal ultrasound 02/10/2022 due to right upper quadrant pain and abnormal LFTs.  She was found to have 3 hypoechoic masses within the liver, recommended MRI to further evaluate. Subsequent MRI abd on 02/11/22 revealed :   1. Marked hepatomegaly with diffuse hepatic steatosis. 2. Numerous liver lesions of varying size and signal characteristics are identified scattered through both hepatic lobes. These are new since the prior CT of 01/14/2016 which showed no focal liver abnormality. Lesions on today's study do not show typical imaging characteristics for any specific etiology and multiple lesion show differential signal characteristics from each other. Given patient age, body habitus, and lack of cancer history, these may represent atypical imaging characteristics of lipid poor adenomas in the setting of background fatty liver, especially given the patient history of OCP use. Imaging characteristics are not typical for [place] or hemangiomas. Dominant lesion in the inferior right liver demonstrates evidence of intralesional hemorrhage. Given that these are new since 2017 and many are relatively large in size, tissue sampling likely warranted as metastatic disease is not excluded.  She presents today for image guided liver mass biopsy for further evaluation.  Normal AFP, neg hepatitis panel.   Past Medical History:  Diagnosis Date   Anemia    slightly anemic, iron supplements every 2-3 days   Bartholin cyst    Chlamydia 01/2011   Diabetes mellitus without complication (HCC)    Family history of  anesthesia complication    grandmother had spinal with nerve damage   No pertinent past medical history     Past Surgical History:  Procedure Laterality Date   CESAREAN SECTION N/A 11/22/2012   Procedure: PRIMARY CESAREAN SECTION;  Surgeon: Alwyn Pea, MD;  Location: [facility];  Service: Obstetrics;  Laterality: N/A;   CYST REMOVAL TRUNK  2005    Allergies: Bee venom, Nickel, and Ibuprofen  Medications: Prior to Admission medications   Medication Sig Start Date End Date Taking? Authorizing Provider  amLODipine (NORVASC) 10 MG tablet Take 1 tablet (10 mg total) by mouth daily. 01/28/22   Coral Spikes, DO  atorvastatin (LIPITOR) 10 MG tablet TAKE 1 TABLET(10 MG) BY MOUTH DAILY 09/13/21   Brita Romp, NP  Continuous Blood Gluc Sensor (FREESTYLE LIBRE 3 SENSOR) MISC Use to check glucose continuously 04/25/22   Brita Romp, NP  insulin aspart (FIASP FLEXTOUCH) 100 UNIT/ML FlexTouch Pen Inject 25-31 Units into the skin 3 (three) times daily before meals. 04/25/22   Brita Romp, NP  insulin glargine, 2 Unit Dial, (TOUJEO MAX SOLOSTAR) 300 UNIT/ML Solostar Pen Inject 100 Units into the skin at bedtime. 04/25/22   Brita Romp, NP  Insulin Pen Needle (BD PEN NEEDLE NANO 2ND GEN) 32G X 4 MM MISC Use to inject insulin 6 times daily. 04/25/22   Brita Romp, NP  lisinopril (ZESTRIL) 40 MG tablet Take 1 tablet (40 mg total) by mouth daily. 04/25/22   Coral Spikes, DO  metFORMIN (GLUCOPHAGE-XR) 500 MG 24 hr tablet Take 2 tablets (1,000 mg total) by mouth daily with supper. 04/25/22   Brita Romp, NP  norgestimate-ethinyl estradiol (MILI) 0.25-35 MG-MCG tablet Take 1 tablet  by mouth daily. 01/25/22   Coral Spikes, DO  Semaglutide, 2 MG/DOSE, 8 MG/3ML SOPN Inject 2 mg as directed once a week. 04/25/22   Brita Romp, NP     Family History  Problem Relation Age of Onset   Cancer Other    Heart disease Other    Diabetes Other    Diabetes Mother    Diabetes  Father    Diabetes Maternal Grandfather     Social History   Socioeconomic History   Marital status: Married    Spouse name: Not on file   Number of children: Not on file   Years of education: Not on file   Highest education level: Not on file  Occupational History   Not on file  Tobacco Use   Smoking status: Never    Passive exposure: Never   Smokeless tobacco: Never  Vaping Use   Vaping Use: Never used  Substance and Sexual Activity   Alcohol use: No    Comment: Occassionally   Drug use: No   Sexual activity: Yes  Other Topics Concern   Not on file  Social History Narrative   Not on file   Social Determinants of Health   Financial Resource Strain: Not on file  Food Insecurity: Not on file  Transportation Needs: Not on file  Physical Activity: Not on file  Stress: Not on file  Social Connections: Not on file      Review of Systems  Vital Signs:     Code Status:      Physical Exam  Imaging: No results found.  Labs:  CBC: Recent Labs    01/25/22 1519  WBC 12.4*  HGB 15.7  HCT 48.8*  PLT 430    COAGS: No results for input(s): "INR", "APTT" in the last 8760 hours.  BMP: Recent Labs    01/25/22 1519 02/10/22 0847 04/18/22 1217  NA 143 139 138  K 4.0 4.5 4.0  CL 98 100 102  CO2 '23 22 20  '$ GLUCOSE 181* 141* 139*  BUN '12 17 11  '$ CALCIUM 10.4* 10.0 9.5  CREATININE 0.83 0.73 0.70    LIVER FUNCTION TESTS: Recent Labs    01/25/22 1519 04/18/22 1217  BILITOT 0.2 0.3  AST 50* 26  ALT 37* 25  ALKPHOS 156* 145*  PROT 7.3 6.7  ALBUMIN 4.5 4.4    TUMOR MARKERS: No results for input(s): "AFPTM", "CEA", "CA199", "CHROMGRNA" in the last 8760 hours.  Assessment and Plan: 32 y.o. female with past medical history of anemia, diabetes, hypertension, hyperlipidemia, obesity, fatty liver who underwent abdominal ultrasound 02/10/2022 due to right upper quadrant pain and abnormal LFTs.  She was found to have 3 hypoechoic masses within the  liver, recommended MRI to further evaluate. Subsequent MRI abd on 02/11/22 revealed :   1. Marked hepatomegaly with diffuse hepatic steatosis. 2. Numerous liver lesions of varying size and signal characteristics are identified scattered through both hepatic lobes. These are new since the prior CT of 01/14/2016 which showed no focal liver abnormality. Lesions on today's study do not show typical imaging characteristics for any specific etiology and multiple lesion show differential signal characteristics from each other. Given patient age, body habitus, and lack of cancer history, these may represent atypical imaging characteristics of lipid poor adenomas in the setting of background fatty liver, especially given the patient history of OCP use. Imaging characteristics are not typical for [place] or hemangiomas. Dominant lesion in the inferior right liver demonstrates evidence of intralesional  hemorrhage. Given that these are new since 2017 and many are relatively large in size, tissue sampling likely warranted as metastatic disease is not excluded.  She presents today for image guided liver mass biopsy for further evaluation.  Normal AFP, neg hepatitis panel.Risks and benefits of procedure was discussed with the patient  including, but not limited to bleeding, infection, damage to adjacent structures or low yield requiring additional tests.  All of the questions were answered and there is agreement to proceed.  Consent signed and in chart.    Thank you for this interesting consult.  I greatly enjoyed meeting Melanie Mccoy and look forward to participating in their care.  A copy of this report was sent to the requesting provider on this date.  Electronically Signed: D. Rowe Robert, PA-C 05/03/2022, 2:30 PM   I spent a total of  25 minutes   in face to face in clinical consultation, greater than 50% of which was counseling/coordinating care for image guided liver mass biopsy

## 2022-05-04 ENCOUNTER — Ambulatory Visit (HOSPITAL_COMMUNITY)
Admission: RE | Admit: 2022-05-04 | Discharge: 2022-05-04 | Disposition: A | Discharge status: Home / Self Care | Payer: 59 | Source: Ambulatory Visit | Attending: Internal Medicine | Admitting: Internal Medicine

## 2022-05-04 DIAGNOSIS — K760 Fatty (change of) liver, not elsewhere classified: Secondary | ICD-10-CM | POA: Diagnosis present

## 2022-05-04 DIAGNOSIS — R160 Hepatomegaly, not elsewhere classified: Secondary | ICD-10-CM

## 2022-05-04 DIAGNOSIS — R7989 Other specified abnormal findings of blood chemistry: Secondary | ICD-10-CM

## 2022-05-04 DIAGNOSIS — K769 Liver disease, unspecified: Secondary | ICD-10-CM

## 2022-05-04 LAB — COMPREHENSIVE METABOLIC PANEL
ALT: 31 U/L (ref 0–44)
AST: 26 U/L (ref 15–41)
Albumin: 3.9 g/dL (ref 3.5–5.0)
Alkaline Phosphatase: 125 U/L (ref 38–126)
Anion gap: 10 (ref 5–15)
BUN: 12 mg/dL (ref 6–20)
CO2: 23 mmol/L (ref 22–32)
Calcium: 9 mg/dL (ref 8.9–10.3)
Chloride: 104 mmol/L (ref 98–111)
Creatinine, Ser: 0.71 mg/dL (ref 0.44–1.00)
GFR, Estimated: >60 mL/min (ref >60)
Glucose, Bld: 211 mg/dL — ABNORMAL HIGH (ref 70–99)
Potassium: 3.8 mmol/L (ref 3.5–5.1)
Sodium: 137 mmol/L (ref 135–145)
Total Bilirubin: 0.5 mg/dL (ref 0.3–1.2)
Total Protein: 7.3 g/dL (ref 6.5–8.1)

## 2022-05-04 LAB — GLUCOSE, CAPILLARY
Glucose-Capillary: 208 mg/dL — ABNORMAL HIGH (ref 70–99)
Glucose-Capillary: 155 mg/dL — ABNORMAL HIGH (ref 70–99)

## 2022-05-04 LAB — CBC WITH DIFFERENTIAL/PLATELET
Abs Immature Granulocytes: 0.09 10*3/uL — ABNORMAL HIGH (ref 0.00–0.07)
Basophils Absolute: 0 10*3/uL (ref 0.0–0.1)
Basophils Relative: 0 %
Eosinophils Absolute: 0.1 10*3/uL (ref 0.0–0.5)
Eosinophils Relative: 0 %
HCT: 39.1 % (ref 36.0–46.0)
Hemoglobin: 12.1 g/dL (ref 12.0–15.0)
Immature Granulocytes: 1 %
Lymphocytes Relative: 17 %
Lymphs Abs: 2 10*3/uL (ref 0.7–4.0)
MCH: 27.7 pg (ref 26.0–34.0)
MCHC: 30.9 g/dL (ref 30.0–36.0)
MCV: 89.5 fL (ref 80.0–100.0)
Monocytes Absolute: 0.9 10*3/uL (ref 0.1–1.0)
Monocytes Relative: 8 %
Neutro Abs: 8.6 10*3/uL — ABNORMAL HIGH (ref 1.7–7.7)
Neutrophils Relative %: 74 %
Platelets: 377 10*3/uL (ref 150–400)
RBC: 4.37 MIL/uL (ref 3.87–5.11)
RDW: 14.8 % (ref 11.5–15.5)
WBC: 11.7 10*3/uL — ABNORMAL HIGH (ref 4.0–10.5)
nRBC: 0 % (ref 0.0–0.2)

## 2022-05-04 LAB — PROTIME-INR
INR: 1 (ref 0.8–1.2)
Prothrombin Time: 13.5 seconds (ref 11.4–15.2)

## 2022-05-04 MED ORDER — MIDAZOLAM HCL 2 MG/2ML IJ SOLN
INTRAMUSCULAR | Status: AC
  Filled 2022-05-04: qty 2

## 2022-05-04 MED ORDER — GELATIN ABSORBABLE 12-7 MM EX MISC
CUTANEOUS | Status: AC
  Filled 2022-05-04: qty 1

## 2022-05-04 MED ORDER — FENTANYL CITRATE (PF) 100 MCG/2ML IJ SOLN
INTRAMUSCULAR | Status: AC
  Filled 2022-05-04: qty 2

## 2022-05-04 MED ORDER — MIDAZOLAM HCL 2 MG/2ML IJ SOLN
INTRAMUSCULAR | Status: AC | PRN
  Administered 2022-05-04 (×3): 1 mg via INTRAVENOUS

## 2022-05-04 MED ORDER — FENTANYL CITRATE (PF) 100 MCG/2ML IJ SOLN
INTRAMUSCULAR | Status: AC | PRN
  Administered 2022-05-04 (×3): 50 ug via INTRAVENOUS

## 2022-05-04 MED ORDER — HYDRALAZINE HCL 20 MG/ML IJ SOLN
10.0000 mg | INTRAMUSCULAR | Status: AC
  Administered 2022-05-04: 10 mg via INTRAVENOUS
  Filled 2022-05-04: qty 1

## 2022-05-04 MED ORDER — LIDOCAINE HCL 1 % IJ SOLN
INTRAMUSCULAR | Status: AC
  Administered 2022-05-04: 10 mL
  Filled 2022-05-04: qty 20

## 2022-05-04 MED ORDER — SODIUM CHLORIDE 0.9 % IV SOLN: INTRAVENOUS | Status: DC

## 2022-05-04 NOTE — Sedation Documentation (Signed)
Sample #1 obtained

## 2022-05-04 NOTE — Discharge Instructions (Addendum)
Liver Biopsy, Care After  May remove dressing or bandaid and shower tomorrow.  Keep site clean and dry. Replace with clean dressing or bandaid as necessary.   Urgent needs - Interventional Radiology clinic [phone]  Follow up with primary care MD regarding high blood pressure  After a liver biopsy, it is common to have these things in the area where the biopsy was done. You may: Have pain. Feel sore. Have bruising. You may also feel tired for a few days. Follow these instructions at home: Medicines Take over-the-counter and prescription medicines only as told by your doctor. If you were prescribed an antibiotic medicine, take it as told by your doctor. Do not stop taking the antibiotic, even if you start to feel better. Do not take medicines that may thin your blood. These medicines include aspirin and ibuprofen. Take them only if your doctor tells you to. If told, take steps to prevent problems with pooping (constipation). You may need to: Drink enough fluid to keep your pee (urine) pale yellow. Take medicines. You will be told what medicines to take. Eat foods that are high in fiber. These include beans, whole grains, and fresh fruits and vegetables. Limit foods that are high in fat and sugar. These include fried or sweet foods. Ask your doctor if you should avoid driving or using machines while you are taking your medicine. Caring for your incision Follow instructions from your doctor about how to take care of your cut from surgery (incisions). Make sure you: Wash your hands with soap and water for at least 20 seconds before and after you change your bandage. If you cannot use soap and water, use hand sanitizer. Change your bandage. Leavestitches or skin glue in place for at least two weeks. Leave tape strips alone unless you are told to take them off. You may trim the edges of the tape strips if they curl up. Check your incision every day for signs of infection. Check  for: Redness, swelling, or more pain. Fluid or blood. Warmth. Pus or a bad smell. Do not take baths, swim, or use a hot tub. Ask your doctor about taking showers or sponge baths. Activity Rest at home for 1-2 days, or as told by your doctor. Get up to take short walks every 1 to 2 hours. Ask for help if you feel weak or unsteady. Do not lift anything that is heavier than 10 lb (4.5 kg), or the limit that you are told. Do not play contact sports for 2 weeks after the procedure. Return to your normal activities as told by your doctor. Ask what activities are safe for you. General instructions Do not drink alcohol in the first week after the procedure. Plan to have a responsible adult care for you for the time you are told after you leave the hospital or clinic. This is important. It is up to you to get the results of your procedure. Ask how to get your results when they are ready. Keep all follow-up visits.   Contact a doctor if: You have more bleeding in your incision. Your incision swells, or is red and more painful. You have fluid that comes from your incision. You develop a rash. You have fever or chills. Get help right away if: You have swelling, bloating, or pain in your belly (abdomen). You get dizzy or faint. You vomit or you feel like vomiting. You have trouble breathing or feel short of breath. You have chest pain. You have problems talking or seeing.  You have trouble with your balance or moving your arms or legs. These symptoms may be an emergency. Get help right away. Call your local emergency services (911 in the [place] Do not wait to see if the symptoms will go away. Do not drive yourself to the hospital. Summary After the procedure, it is common to have pain, soreness, bruising, and tiredness. Your doctor will tell you how to take care of yourself at home. Change your bandage, take your medicines, and limit your activities as told by your doctor. Call your doctor if  you have symptoms of infection. Get help right away if your belly swells, your cut bleeds a lot, or you have trouble talking or breathing. This information is not intended to replace advice given to you by your health care provider. Make sure you discuss any questions you have with your healthcare provider. Document Revised: 01/06/2020 Document Reviewed: 01/06/2020 Elsevier Patient Education  2022 [place].  Moderate Conscious Sedation, Adult, Care After This sheet gives you information about how to care for yourself after your procedure. Your health care provider may also give you more specific instructions. If you have problems or questions, contact your health careprovider. What can I expect after the procedure? After the procedure, it is common to have: Sleepiness for several hours. Impaired judgment for several hours. Difficulty with balance. Vomiting if you eat too soon. Follow these instructions at home: For the time period you were told by your health care provider: Rest. Do not participate in activities where you could fall or become injured. Do not drive or use machinery. Do not drink alcohol. Do not take sleeping pills or medicines that cause drowsiness. Do not make important decisions or sign legal documents. Do not take care of children on your own. Eating and drinking  Follow the diet recommended by your health care provider. Drink enough fluid to keep your urine pale yellow. If you vomit: Drink water, juice, or soup when you can drink without vomiting. Make sure you have little or no nausea before eating solid foods.  General instructions Take over-the-counter and prescription medicines only as told by your health care provider. Have a responsible adult stay with you for the time you are told. It is important to have someone help care for you until you are awake and alert. Do not smoke. Keep all follow-up visits as told by your health care provider. This is  important. Contact a health care provider if: You are still sleepy or having trouble with balance after 24 hours. You feel light-headed. You keep feeling nauseous or you keep vomiting. You develop a rash. You have a fever. You have redness or swelling around the IV site. Get help right away if: You have trouble breathing. You have new-onset confusion at home. Summary After the procedure, it is common to feel sleepy, have impaired judgment, or feel nauseous if you eat too soon. Rest after you get home. Know the things you should not do after the procedure. Follow the diet recommended by your health care provider and drink enough fluid to keep your urine pale yellow. Get help right away if you have trouble breathing or new-onset confusion at home. This information is not intended to replace advice given to you by your health care provider. Make sure you discuss any questions you have with your healthcare provider. Document Revised: 06/21/2019 Document Reviewed: 01/17/2019 Elsevier Patient Education  2022 [employer].

## 2022-05-04 NOTE — Procedures (Signed)
Interventional Radiology Procedure Note  Procedure: US Guided Biopsy of right lobe liver mass  Complications: None  Estimated Blood Loss: < 10 mL  Findings: 18 G core biopsy of 6 cm right lobe liver lesion performed under US guidance.  3 core samples obtained and sent to Pathology.  Venetia Night. Kathlene Cote, M.D Pager:  [phone]

## 2022-05-04 NOTE — Sedation Documentation (Signed)
Sample #3 obtained

## 2022-05-04 NOTE — Sedation Documentation (Signed)
Sample #2 obtained

## 2022-05-05 ENCOUNTER — Ambulatory Visit: Payer: 59 | Admitting: Family Medicine

## 2022-05-06 LAB — SURGICAL PATHOLOGY

## 2022-05-10 LAB — BASIC METABOLIC PANEL
BUN/Creatinine Ratio: 21 (ref 9–23)
BUN: 16 mg/dL (ref 6–20)
CO2: 22 mmol/L (ref 20–29)
Calcium: 9.4 mg/dL (ref 8.7–10.2)
Chloride: 100 mmol/L (ref 96–106)
Creatinine, Ser: 0.77 mg/dL (ref 0.57–1.00)
Glucose: 255 mg/dL — ABNORMAL HIGH (ref 70–99)
Potassium: 4.5 mmol/L (ref 3.5–5.2)
Sodium: 140 mmol/L (ref 134–144)
eGFR: 106 mL/min/{1.73_m2} (ref >59)

## 2022-05-10 LAB — HEMOGLOBIN A1C
Est. average glucose Bld gHb Est-mCnc: 220 mg/dL
Hgb A1c MFr Bld: 9.3 % — ABNORMAL HIGH (ref 4.8–5.6)

## 2022-05-13 ENCOUNTER — Ambulatory Visit: Payer: 59 | Admitting: Nurse Practitioner

## 2022-05-13 NOTE — Progress Notes (Unsigned)
Referring Provider: Coral Spikes, DO Primary Care Physician:  Coral Spikes, DO Primary GI Physician: Dr. Abbey Chatters  No chief complaint on file.   HPI:   Melanie Mccoy is a 32 y.o. female presenting today with a history of   Abdominal ultrasound 02/10/2022 due to right upper quadrant pain and abnormal LFTs. Found to have 3 hypoechoic masses within the liver, recommended MRI to further evaluate.   Subsequent MRI with numerous liver lesions of varying size and characteristic is scattered throughout both hepatic lobes. Lesions without typical imaging characteristics for any specific etiology, dominant lesion in the inferior right liver demonstrates evidence of intralesional hemorrhage, recommended tissue sampling.   Also with evidence of fatty liver.   Last seen in our office 04/20/2022 for evaluation of liver lesions.  Serologic workup as well as liver biopsy was ordered.  Labs 04/20/2022: Acute viral hepatitis panel negative, AFP normal, no alpha-1 antitrypsin deficiency, celiac screen negative, ANA, ASMA, AMA all negative, immunoglobulins within normal limits, iron panel within normal limits aside from mildly low iron saturation of 12%.  Most recent LFTs 04/26/2022 entirely normal.  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    Today:     Past Medical History:  Diagnosis Date   Anemia    slightly anemic, iron supplements every 2-3 days   Bartholin cyst    Chlamydia 01/2011   Diabetes mellitus without complication (HCC)    Family history of anesthesia complication    grandmother had spinal with nerve damage   No pertinent past medical history     Past Surgical History:  Procedure Laterality Date   CESAREAN SECTION N/A 11/22/2012   Procedure: PRIMARY CESAREAN SECTION;  Surgeon: Alwyn Pea, MD;  Location: [facility];  Service: Obstetrics;  Laterality: N/A;   CYST REMOVAL TRUNK  2005    Current Outpatient Medications  Medication Sig Dispense  Refill   amLODipine (NORVASC) 10 MG tablet Take 1 tablet (10 mg total) by mouth daily. 90 tablet 3   atorvastatin (LIPITOR) 10 MG tablet TAKE 1 TABLET(10 MG) BY MOUTH DAILY 90 tablet 3   Continuous Blood Gluc Sensor (FREESTYLE LIBRE 3 SENSOR) MISC Use to check glucose continuously 6 each 3   insulin aspart (FIASP FLEXTOUCH) 100 UNIT/ML FlexTouch Pen Inject 25-31 Units into the skin 3 (three) times daily before meals. 75 mL 3   insulin glargine, 2 Unit Dial, (TOUJEO MAX SOLOSTAR) 300 UNIT/ML Solostar Pen Inject 100 Units into the skin at bedtime. 36 mL 2   Insulin Pen Needle (BD PEN NEEDLE NANO 2ND GEN) 32G X 4 MM MISC Use to inject insulin 6 times daily. 300 each 3   lisinopril (ZESTRIL) 40 MG tablet Take 1 tablet (40 mg total) by mouth daily. 90 tablet 3   metFORMIN (GLUCOPHAGE-XR) 500 MG 24 hr tablet Take 2 tablets (1,000 mg total) by mouth daily with supper. 180 tablet 3   norgestimate-ethinyl estradiol (MILI) 0.25-35 MG-MCG tablet Take 1 tablet by mouth daily. 28 tablet 11   Semaglutide, 2 MG/DOSE, 8 MG/3ML SOPN Inject 2 mg as directed once a week. 6 mL 3   No current facility-administered medications for this visit.    Allergies as of 05/18/2022 - Review Complete 05/04/2022  Allergen Reaction Noted   Bee venom Swelling 09/21/2013   Nickel Rash and Itching 02/11/2011   Ibuprofen Hives 03/21/2016    Family History  Problem Relation Age of Onset   Cancer Other    Heart disease Other  Diabetes Other    Diabetes Mother    Diabetes Father    Diabetes Maternal Grandfather     Social History   Socioeconomic History   Marital status: Married    Spouse name: Not on file   Number of children: Not on file   Years of education: Not on file   Highest education level: Not on file  Occupational History   Not on file  Tobacco Use   Smoking status: Never    Passive exposure: Never   Smokeless tobacco: Never  Vaping Use   Vaping Use: Never used  Substance and Sexual Activity    Alcohol use: No    Comment: Occassionally   Drug use: No   Sexual activity: Yes  Other Topics Concern   Not on file  Social History Narrative   Not on file   Social Determinants of Health   Financial Resource Strain: Not on file  Food Insecurity: Not on file  Transportation Needs: Not on file  Physical Activity: Not on file  Stress: Not on file  Social Connections: Not on file    Review of Systems: Gen: Denies fever, chills, anorexia. Denies fatigue, weakness, weight loss.  CV: Denies chest pain, palpitations, syncope, peripheral edema, and claudication. Resp: Denies dyspnea at rest, cough, wheezing, coughing up blood, and pleurisy. GI: Denies vomiting blood, jaundice, and fecal incontinence.   Denies dysphagia or odynophagia. Derm: Denies rash, itching, dry skin Psych: Denies depression, anxiety, memory loss, confusion. No homicidal or suicidal ideation.  Heme: Denies bruising, bleeding, and enlarged lymph nodes.  Physical Exam: LMP 04/16/2022  General:   Alert and oriented. No distress noted. Pleasant and cooperative.  Head:  Normocephalic and atraumatic. Eyes:  Conjuctiva clear without scleral icterus. Heart:  S1, S2 present without murmurs appreciated. Lungs:  Clear to auscultation bilaterally. No wheezes, rales, or rhonchi. No distress.  Abdomen:  +BS, soft, non-tender and non-distended. No rebound or guarding. No HSM or masses noted. Msk:  Symmetrical without gross deformities. Normal posture. Extremities:  Without edema. Neurologic:  Alert and  oriented x4 Psych:  Normal mood and affect.    Assessment:     Plan:  ***   Aliene Altes, PA-C [facility]astroenter[facility] 05/18/2022

## 2022-05-18 ENCOUNTER — Telehealth: Payer: Self-pay | Admitting: *Deleted

## 2022-05-18 ENCOUNTER — Other Ambulatory Visit: Payer: Self-pay | Admitting: *Deleted

## 2022-05-18 ENCOUNTER — Encounter: Payer: Self-pay | Admitting: Gastroenterology

## 2022-05-18 ENCOUNTER — Ambulatory Visit: Payer: 59 | Admitting: Gastroenterology

## 2022-05-18 VITALS — BP 122/81 | HR 83 | Temp 97.4°F | Ht 63.0 in | Wt 213.0 lb

## 2022-05-18 DIAGNOSIS — D134 Benign neoplasm of liver: Secondary | ICD-10-CM

## 2022-05-18 DIAGNOSIS — R1013 Epigastric pain: Secondary | ICD-10-CM | POA: Insufficient documentation

## 2022-05-18 DIAGNOSIS — R1011 Right upper quadrant pain: Secondary | ICD-10-CM | POA: Insufficient documentation

## 2022-05-18 NOTE — Patient Instructions (Addendum)
Please have H. pylori breath test completed at [facility].  We will arrange to have an ultrasound of your liver at [facility] due to the discomfort you are having in your right upper quadrant.  We are referring you to [facility] for further evaluation/management of liver lesions.  Continue to avoid all hormonal contraceptives.  Follow-up with your primary care doctor on your elevated blood pressure.  We will follow-up with you in 3 months.  Do not hesitate to call sooner if you have questions or concerns.  It was nice to meet you today!   Aliene Altes, PA-C [facility] Gastroenterology

## 2022-05-18 NOTE — Telephone Encounter (Signed)
Pt informed that RUQ [place] scheduled for 05/31/22, arrive at 10:15 am to check in and NPO after midnight. Pt took number to central scheduling because she may have to reschedule.

## 2022-05-20 ENCOUNTER — Other Ambulatory Visit: Payer: Self-pay | Admitting: Gastroenterology

## 2022-05-20 DIAGNOSIS — R1013 Epigastric pain: Secondary | ICD-10-CM

## 2022-05-20 LAB — H. PYLORI BREATH TEST: H pylori Breath Test: NEGATIVE

## 2022-05-20 MED ORDER — PANTOPRAZOLE SODIUM 40 MG PO TBEC: 40.0000 mg | DELAYED_RELEASE_TABLET | Freq: Every day | ORAL | 1 refills | Status: DC

## 2022-05-25 ENCOUNTER — Ambulatory Visit (INDEPENDENT_AMBULATORY_CARE_PROVIDER_SITE_OTHER): Payer: 59 | Admitting: Family Medicine

## 2022-05-25 VITALS — BP 120/82 | HR 96 | Temp 98.1°F | Ht 63.0 in | Wt 212.0 lb

## 2022-05-25 DIAGNOSIS — I10 Essential (primary) hypertension: Secondary | ICD-10-CM

## 2022-05-25 DIAGNOSIS — R1011 Right upper quadrant pain: Secondary | ICD-10-CM | POA: Diagnosis not present

## 2022-05-25 NOTE — Assessment & Plan Note (Signed)
Likely just from recent biopsy.  Awaiting upcoming ultrasound.

## 2022-05-25 NOTE — Patient Instructions (Signed)
You can discontinue the protonix.  Monitor BP regularly at home.  If pain worsens, please let us know. I believe this is benign and just from recent biopsy.  Follow up in 3 months.

## 2022-05-25 NOTE — Progress Notes (Signed)
Subjective:  Patient ID: Melanie Mccoy, female    DOB: 1990-05-17  Age: 32 y.o. MRN: [id number]  CC: Chief Complaint  Patient presents with   Hypertension   GI follow up questions    HPI:  32 year old female with the below mentioned medical problems presents for follow-up regarding hypertension.  At last visit, lisinopril was increased to 40 mg daily.  She remains on amlodipine 10 mg daily.  Recent visit with the GI office reflected a good blood pressure but her blood pressure is elevated here today.  Patient reports that she has had a recent liver biopsy.  She is having some pain in the right upper quadrant.  She states that she was recently placed on Protonix.  She states that she is unsure why and would like to discuss this today.  Recent testing for H. pylori was negative.  Patient has upcoming right upper quadrant ultrasound.  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   RUQ pain 05/18/2022   Hepatic steatosis 04/25/2022   Mixed hyperlipidemia 04/14/2021   Severe obesity (BMI 35.0-39.9) with comorbidity ([place]) 04/14/2021   Essential hypertension 09/18/2019   Uncontrolled type 2 diabetes mellitus with hyperglycemia ([place]) 01/19/2016   PCOS (polycystic ovarian syndrome) 04/15/2014    Social Hx   Social History   Socioeconomic History   Marital status: Married    Spouse name: Not on file   Number of children: Not on file   Years of education: Not on file   Highest education level: Not on file  Occupational History   Not on file  Tobacco Use   Smoking status: Never    Passive exposure: Never   Smokeless tobacco: Never  Vaping Use   Vaping Use: Never used  Substance and Sexual Activity   Alcohol use: No    Comment: Occassionally   Drug use: No   Sexual activity: Yes  Other Topics Concern   Not on file  Social History Narrative   Not on file   Social Determinants of Health   Financial Resource Strain: Not on file   Food Insecurity: Not on file  Transportation Needs: Not on file  Physical Activity: Not on file  Stress: Not on file  Social Connections: Not on file    Review of Systems Per HPI  Objective:  BP 120/82   Pulse 96   Temp 98.1 F (36.7 C)   Ht 5\' 3"  (1.6 m)   Wt 212 lb (96.2 kg)   LMP 05/16/2022 (Approximate)   SpO2 98%   BMI 37.55 kg/m      05/25/2022    8:51 AM 05/25/2022    8:30 AM 05/18/2022    9:24 AM  BP/Weight  Systolic BP 123456 [id number]  Diastolic BP 82 98 81  Wt. (Lbs)  212   BMI  37.55 kg/m2     Physical Exam Vitals and nursing note reviewed.  Constitutional:      Appearance: Normal appearance. She is obese.  HENT:     Head: Normocephalic and atraumatic.  Cardiovascular:     Rate and Rhythm: Normal rate and regular rhythm.  Pulmonary:     Effort: Pulmonary effort is normal.     Breath sounds: Normal breath sounds. No wheezing, rhonchi or rales.  Abdominal:     General: There is no distension.     Palpations: Abdomen is soft.     Comments: Mild tenderness in the right upper quadrant.  Neurological:  Mental Status: She is alert.  Psychiatric:        Mood and Affect: Mood normal.        Behavior: Behavior normal.     Lab Results  Component Value Date   WBC 11.7 (H) 05/04/2022   HGB 12.1 05/04/2022   HCT 39.1 05/04/2022   PLT 377 05/04/2022   GLUCOSE 255 (H) 05/09/2022   CHOL 135 04/18/2022   TRIG 272 (H) 04/18/2022   HDL 34 (L) 04/18/2022   LDLCALC 58 04/18/2022   ALT 31 05/04/2022   AST 26 05/04/2022   NA 140 05/09/2022   K 4.5 05/09/2022   CL 100 05/09/2022   CREATININE 0.77 05/09/2022   BUN 16 05/09/2022   CO2 22 05/09/2022   TSH 0.863 04/18/2022   INR 1.0 05/04/2022   HGBA1C 9.3 (H) 05/09/2022   MICROALBUR 80 01/01/2021     Assessment & Plan:   Problem List Items Addressed This Visit       Cardiovascular and Mediastinum   Essential hypertension - Primary    Recheck blood pressure revealed blood pressure of 120/82.   Patient to check blood pressure regularly at home.  Continue lisinopril.  Continue amlodipine.        Other   RUQ pain    Likely just from recent biopsy.  Awaiting upcoming ultrasound.       Follow-up:  3 months  [place]

## 2022-05-25 NOTE — Assessment & Plan Note (Signed)
Recheck blood pressure revealed blood pressure of 120/82.  Patient to check blood pressure regularly at home.  Continue lisinopril.  Continue amlodipine.

## 2022-05-27 ENCOUNTER — Ambulatory Visit (HOSPITAL_COMMUNITY)
Admission: RE | Admit: 2022-05-27 | Discharge: 2022-05-27 | Disposition: A | Discharge status: Home / Self Care | Payer: 59 | Source: Ambulatory Visit | Attending: Gastroenterology | Admitting: Gastroenterology

## 2022-05-27 DIAGNOSIS — D134 Benign neoplasm of liver: Secondary | ICD-10-CM | POA: Insufficient documentation

## 2022-05-31 ENCOUNTER — Other Ambulatory Visit (HOSPITAL_COMMUNITY): Payer: 59

## 2022-06-16 ENCOUNTER — Encounter: Payer: Self-pay | Admitting: Emergency Medicine

## 2022-06-16 ENCOUNTER — Ambulatory Visit
Admission: EM | Admit: 2022-06-16 | Discharge: 2022-06-16 | Disposition: A | Discharge status: Home / Self Care | Payer: 59 | Attending: Nurse Practitioner | Admitting: Nurse Practitioner

## 2022-06-16 DIAGNOSIS — L02214 Cutaneous abscess of groin: Secondary | ICD-10-CM | POA: Diagnosis not present

## 2022-06-16 MED ORDER — CHLORHEXIDINE GLUCONATE 4 % EX LIQD: Freq: Two times a day (BID) | CUTANEOUS | 0 refills | Status: DC | PRN

## 2022-06-16 MED ORDER — SULFAMETHOXAZOLE-TRIMETHOPRIM 800-160 MG PO TABS: 1.0000 | ORAL_TABLET | Freq: Two times a day (BID) | ORAL | 0 refills | Status: AC

## 2022-06-16 NOTE — ED Provider Notes (Signed)
[facility]    CSN: [id number] Arrival date & time: 06/16/22  0815      History   Chief Complaint No chief complaint on file.   HPI Melanie Mccoy is a 32 y.o. female.   Patient presents today for recurrent abscess to right groin that began Monday.  Reports area is swollen, tender to touch, and slightly red.  She reports a history of similar abscesses, has had this abscess drained by general surgeon about 1 year ago.  Reports she has been trying warm compresses without relief of symptoms.  No fever, bodyaches or chills.  No new nausea or vomiting.  Patient denies antibiotic use in the past 90 days.    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   RUQ pain 05/18/2022   Hepatic steatosis 04/25/2022   Mixed hyperlipidemia 04/14/2021   Severe obesity (BMI 35.0-39.9) with comorbidity 04/14/2021   Essential hypertension 09/18/2019   Uncontrolled type 2 diabetes mellitus with hyperglycemia 01/19/2016   PCOS (polycystic ovarian syndrome) 04/15/2014    Past Surgical History:  Procedure Laterality Date   CESAREAN SECTION N/A 11/22/2012   Procedure: PRIMARY CESAREAN SECTION;  Surgeon: Esmeralda Arthur, MD;  Location: [facility];  Service: Obstetrics;  Laterality: N/A;   CYST REMOVAL TRUNK  2005    OB History     Gravida  2   Para  2   Term      Preterm      AB      Living  1      SAB      IAB      Ectopic      Multiple      Live Births  1            Home Medications    Prior to Admission medications   Medication Sig Start Date End Date Taking? Authorizing Provider   amLODipine (NORVASC) 10 MG tablet Take 1 tablet (10 mg total) by mouth daily. 01/28/22   Tommie Sams, DO  atorvastatin (LIPITOR) 10 MG tablet TAKE 1 TABLET(10 MG) BY MOUTH DAILY 09/13/21   Dani Gobble, NP  chlorhexidine (HIBICLENS) 4 % external liquid Apply topically 2 (two) times daily as needed. 06/16/22  Yes Valentino Nose, NP  Continuous Blood Gluc Sensor (FREESTYLE LIBRE 3 SENSOR) MISC Use to check glucose continuously 04/25/22   Dani Gobble, NP  insulin aspart (FIASP FLEXTOUCH) 100 UNIT/ML FlexTouch Pen Inject 25-31 Units into the skin 3 (three) times daily before meals. 04/25/22   Dani Gobble, NP  insulin glargine, 2 Unit Dial, (TOUJEO MAX SOLOSTAR) 300 UNIT/ML Solostar Pen Inject 100 Units into the skin at bedtime. 04/25/22   Dani Gobble, NP  Insulin Pen Needle (BD PEN NEEDLE NANO 2ND GEN) 32G X 4 MM MISC Use to inject insulin 6 times daily. 04/25/22   Dani Gobble, NP  lisinopril (ZESTRIL) 40 MG tablet Take 1 tablet (40 mg total) by mouth daily. 04/25/22   Tommie Sams, DO  metFORMIN (GLUCOPHAGE-XR) 500 MG 24  hr tablet Take 2 tablets (1,000 mg total) by mouth daily with supper. 04/25/22   Dani Gobble, NP  Semaglutide, 2 MG/DOSE, 8 MG/3ML SOPN Inject 2 mg as directed once a week. 04/25/22   Dani Gobble, NP  sulfamethoxazole-trimethoprim (BACTRIM DS) 800-160 MG tablet Take 1 tablet by mouth 2 (two) times daily for 7 days. 06/16/22 06/23/22 Yes Valentino Nose, NP    Family History Family History  Problem Relation Age of Onset   Cancer Other    Heart disease Other    Diabetes Other    Diabetes Mother    Diabetes Father    Diabetes Maternal Grandfather     Social History Social History   Tobacco Use   Smoking status: Never    Passive exposure: Never   Smokeless tobacco: Never  Vaping Use   Vaping Use: Never used  Substance Use Topics   Alcohol use: No    Comment: Occassionally   Drug use: No     Allergies   Bee venom,  Nickel, and Ibuprofen   Review of Systems Review of Systems Per HPI  Physical Exam Triage Vital Signs ED Triage Vitals  Enc Vitals Group     BP 06/16/22 0857 120/81     Pulse Rate 06/16/22 0857 82     Resp 06/16/22 0857 18     Temp 06/16/22 0857 98.1 F (36.7 C)     Temp Source 06/16/22 0857 Oral     SpO2 06/16/22 0857 95 %     Weight --      Height --      Head Circumference --      Peak Flow --      Pain Score 06/16/22 0859 8     Pain Loc --      Pain Edu? --      Excl. in GC? --    No data found.  Updated Vital Signs BP 120/81 (BP Location: Right Arm)   Pulse 82   Temp 98.1 F (36.7 C) (Oral)   Resp 18   LMP 05/31/2022   SpO2 95%   Visual Acuity Right Eye Distance:   Left Eye Distance:   Bilateral Distance:    Right Eye Near:   Left Eye Near:    Bilateral Near:     Physical Exam Vitals and nursing note reviewed.  Constitutional:      General: She is not in acute distress.    Appearance: Normal appearance. She is not toxic-appearing.  HENT:     Mouth/Throat:     Mouth: Mucous membranes are moist.     Pharynx: Oropharynx is clear.  Pulmonary:     Effort: Pulmonary effort is normal. No respiratory distress.  Skin:    General: Skin is warm and dry.     Capillary Refill: Capillary refill takes less than 2 seconds.     Findings: Abscess present.          Comments: Approximately 4 cm x 3 cm fluctuant abscess to right groin in area marked; area is tender to palpation and slightly erythematous.  No drainage, warmth.    Neurological:     Mental Status: She is alert and oriented to person, place, and time.  Psychiatric:        Behavior: Behavior is cooperative.      UC Treatments / Results  Labs (all labs ordered are listed, but only abnormal results are displayed) Labs Reviewed - No data to display  EKG   Radiology  No results found.  Procedures Incision and Drainage  Date/Time: 06/16/2022 10:28 AM  Performed by: Valentino NoseMartinez, Jahmir Salo A,  NP Authorized by: Valentino NoseMartinez, Renesmae Donahey A, NP   Consent:    Consent obtained:  Verbal   Consent given by:  Patient   Risks, benefits, and alternatives were discussed: yes     Risks discussed:  Bleeding, incomplete drainage, pain and infection   Alternatives discussed:  Alternative treatment Universal protocol:    Procedure explained and questions answered to patient or proxy's satisfaction: yes     Patient identity confirmed:  Verbally with patient Location:    Type:  Abscess   Size:  4 cm x 3 cm   Location: right groin. Pre-procedure details:    Skin preparation:  Antiseptic wash and povidone-iodine Anesthesia:    Anesthesia method:  Local infiltration   Local anesthetic:  Lidocaine 2% w/o epi Procedure type:    Complexity:  Simple Procedure details:    Ultrasound guidance: no     Needle aspiration: no     Incision types:  Stab incision   Incision depth:  Dermal   Drainage:  Purulent   Drainage amount:  Copious   Wound treatment:  Wound left open   Packing materials:  None Post-procedure details:    Procedure completion:  Tolerated well, no immediate complications  (including critical care time)  Medications Ordered in UC Medications - No data to display  Initial Impression / Assessment and Plan / UC Course  I have reviewed the triage vital signs and the nursing notes.  Pertinent labs & imaging results that were available during my care of the patient were reviewed by me and considered in my medical decision making (see chart for details).   Patient is well-appearing, normotensive, afebrile, not tachycardic, not tachypneic, oxygenating well on room air.    1. Abscess of right groin I&D of abscess as above Wound care discussed - start Hibiclens rinses tonight Start Bactrim DS twice daily for 7 days Recommended follow-up with general surgery Note given for work  The patient was given the opportunity to ask questions.  All questions answered to their satisfaction.  The  patient is in agreement to this plan.    Final Clinical Impressions(s) / UC Diagnoses   Final diagnoses:  Abscess of right groin     Discharge Instructions      Please begin cleaning the area twice daily with Hibiclens solution starting tonight.  Keep the area covered with a gauze and tape to allow it to drain.  He can continue warm compresses.  Please also start the Bactrim DS twice daily for 7 days.  Recommend following up with general surgery for further evaluation and management of this recurrent abscess.    ED Prescriptions     Medication Sig Dispense Auth. Provider   chlorhexidine (HIBICLENS) 4 % external liquid Apply topically 2 (two) times daily as needed. 120 mL Cathlean MarseillesMartinez, Calieb Lichtman A, NP   sulfamethoxazole-trimethoprim (BACTRIM DS) 800-160 MG tablet Take 1 tablet by mouth 2 (two) times daily for 7 days. 14 tablet Valentino NoseMartinez, Gloria Ricardo A, NP      PDMP not reviewed this encounter.   Valentino NoseMartinez, Jehan Ranganathan A, NP 06/16/22 1030

## 2022-06-16 NOTE — ED Notes (Signed)
Applied antibiotic ointment to the open abscess along with a non adherent dressing and a Tegaderm seal to patient's vagina.

## 2022-06-16 NOTE — Discharge Instructions (Addendum)
Please begin cleaning the area twice daily with Hibiclens solution starting tonight.  Keep the area covered with a gauze and tape to allow it to drain.  He can continue warm compresses.  Please also start the Bactrim DS twice daily for 7 days.  Recommend following up with general surgery for further evaluation and management of this recurrent abscess.

## 2022-06-16 NOTE — ED Triage Notes (Signed)
Abscess in right groin area since Monday.   has been using warm compress without relief.

## 2022-07-18 ENCOUNTER — Telehealth: Payer: Self-pay | Admitting: *Deleted

## 2022-07-18 NOTE — Telephone Encounter (Signed)
Patient was called and made aware. 

## 2022-07-18 NOTE — Telephone Encounter (Signed)
Patient states that her Blood Sugars have been running high for the last 2 -3 weeks. She is eating the same, exercising more.  The last of March early April she was not feeling well , nausea and diarrhea. From April 5 th-19 th the patient was not using her sliding scale insulins. She had eaten a glutin free pizza and feels that this caused her sickness, because after eating this is when she noticed the high blood sugars. They are running in the 300 - 400 range. She states that she is eating low carbs. She is taking Toujeo 100 units at bedtime , Fiasp 25-31 units TID, Ozempic 2 mg weekly , Metformin 500 mg twice a day. Resumed her Fiasp on 04/22.

## 2022-07-18 NOTE — Telephone Encounter (Signed)
Have her increase her Toujeo to 120 units SQ nightly and continue her other meds as prescribed.  That may help her get back on track and we can reduce back down in the future if needed.

## 2022-07-22 ENCOUNTER — Ambulatory Visit: Payer: 59 | Admitting: Gastroenterology

## 2022-08-08 ENCOUNTER — Telehealth: Payer: Self-pay | Admitting: "Endocrinology

## 2022-08-08 DIAGNOSIS — E119 Type 2 diabetes mellitus without complications: Secondary | ICD-10-CM

## 2022-08-08 NOTE — Telephone Encounter (Signed)
Patient is asking for a refill on her [facility], and [address] to [facility] on [address]

## 2022-08-09 MED ORDER — TOUJEO MAX SOLOSTAR 300 UNIT/ML ~~LOC~~ SOPN: 100.0000 [IU] | PEN_INJECTOR | Freq: Every evening | SUBCUTANEOUS | 0 refills | Status: DC

## 2022-08-09 MED ORDER — FIASP FLEXTOUCH 100 UNIT/ML ~~LOC~~ SOPN: 25.0000 [IU] | PEN_INJECTOR | Freq: Three times a day (TID) | SUBCUTANEOUS | 0 refills | Status: DC

## 2022-08-09 MED ORDER — SEMAGLUTIDE (2 MG/DOSE) 8 MG/3ML ~~LOC~~ SOPN: 2.0000 mg | PEN_INJECTOR | SUBCUTANEOUS | 0 refills | Status: DC

## 2022-08-09 NOTE — Telephone Encounter (Signed)
Rx Sent  

## 2022-08-23 NOTE — Progress Notes (Deleted)
Referring Provider: Tommie Sams, DO Primary Care Physician:  Tommie Sams, DO Primary GI Physician: Dr. Marletta Lor  No chief complaint on file.   HPI:   Melanie Mccoy is a 32 y.o. female with a history of diabetes, multiple liver lesions, found to have benign hepatic adenoma on biopsy in February 2024, presenting today for follow-up. ***  Last seen in our office 05/18/2022.  She reported intermittent epigastric burning has been present for quite some time and occurred more so when her stomach was empty.  Denied NSAIDs, reflux, nausea, vomiting, dysphagia.  Thought she had history of H. pylori in 2019.  Recommended checking H. pylori breath test and if negative, consider 8-week course of PPI.  If persistent, would need to consider EGD.  She also reported some intermittent RUQ abdominal cramping since her liver biopsy.  No postprandial symptoms.  Overall, suspected this was secondary to recovering from biopsy itself, but recommended checking RUQ ultrasound.  Regarding hepatic adenoma, though biopsy in February was benign, noted that adenomas greater than 5 cm have increased risk of malignancy and rupture and guidelines recommended considering intervention for surgical or nonsurgical modalities.  She was referred to [facility] for management.  She had previously been on OCPs, but discontinued these in February.  H. pylori breath test was negative.  Recommend starting pantoprazole 40 mg daily for at least 8 weeks.   RUQ ultrasound with numerous liver masses again identified with no significant change.  Initial consultation with Sherlynn Stalls, MD (general surgery at [facility]).  Complete note is not yet available. ***  Today:     Past Medical History:  Diagnosis Date   Anemia    slightly anemic, iron supplements every 2-3 days   Bartholin cyst    Chlamydia 01/2011   Diabetes mellitus without complication (HCC)    Family history of anesthesia complication    grandmother had  spinal with nerve damage   Hepatic adenoma    No pertinent past medical history     Past Surgical History:  Procedure Laterality Date   CESAREAN SECTION N/A 11/22/2012   Procedure: PRIMARY CESAREAN SECTION;  Surgeon: Esmeralda Arthur, MD;  Location: [facility];  Service: Obstetrics;  Laterality: N/A;   CYST REMOVAL TRUNK  2005    Current Outpatient Medications  Medication Sig Dispense Refill   amLODipine (NORVASC) 10 MG tablet Take 1 tablet (10 mg total) by mouth daily. 90 tablet 3   atorvastatin (LIPITOR) 10 MG tablet TAKE 1 TABLET(10 MG) BY MOUTH DAILY 90 tablet 3   chlorhexidine (HIBICLENS) 4 % external liquid Apply topically 2 (two) times daily as needed. 120 mL 0   Continuous Blood Gluc Sensor (FREESTYLE LIBRE 3 SENSOR) MISC Use to check glucose continuously 6 each 3   insulin aspart (FIASP FLEXTOUCH) 100 UNIT/ML FlexTouch Pen Inject 25-31 Units into the skin 3 (three) times daily before meals. 75 mL 0   insulin glargine, 2 Unit Dial, (TOUJEO MAX SOLOSTAR) 300 UNIT/ML Solostar Pen Inject 100 Units into the skin at bedtime. 36 mL 0   Insulin Pen Needle (BD PEN NEEDLE NANO 2ND GEN) 32G X 4 MM MISC Use to inject insulin 6 times daily. 300 each 3   lisinopril (ZESTRIL) 40 MG tablet Take 1 tablet (40 mg total) by mouth daily. 90 tablet 3   metFORMIN (GLUCOPHAGE-XR) 500 MG 24 hr tablet Take 2 tablets (1,000 mg total) by mouth daily with supper. 180 tablet 3   Semaglutide, 2 MG/DOSE,  8 MG/3ML SOPN Inject 2 mg as directed once a week. 6 mL 0   No current facility-administered medications for this visit.    Allergies as of 08/25/2022 - Review Complete 06/16/2022  Allergen Reaction Noted   Bee venom Swelling 09/21/2013   Nickel Rash and Itching 02/11/2011   Ibuprofen Hives 03/21/2016    Family History  Problem Relation Age of Onset   Cancer Other    Heart disease Other    Diabetes Other    Diabetes Mother    Diabetes Father    Diabetes Maternal Grandfather     Social History    Socioeconomic History   Marital status: Married    Spouse name: Not on file   Number of children: Not on file   Years of education: Not on file   Highest education level: Associate degree: academic program  Occupational History   Not on file  Tobacco Use   Smoking status: Never    Passive exposure: Never   Smokeless tobacco: Never  Vaping Use   Vaping Use: Never used  Substance and Sexual Activity   Alcohol use: No    Comment: Occassionally   Drug use: No   Sexual activity: Yes  Other Topics Concern   Not on file  Social History Narrative   Not on file   Social Determinants of Health   Financial Resource Strain: Low Risk  (08/22/2022)   Overall Financial Resource Strain (CARDIA)    Difficulty of Paying Living Expenses: Not very hard  Food Insecurity: No Food Insecurity (08/22/2022)   Hunger Vital Sign    Worried About Running Out of Food in the Last Year: Never true    Ran Out of Food in the Last Year: Never true  Transportation Needs: No Transportation Needs (08/22/2022)   PRAPARE - Administrator, Civil Service (Medical): No    Lack of Transportation (Non-Medical): No  Physical Activity: Insufficiently Active (08/22/2022)   Exercise Vital Sign    Days of Exercise per Week: 1 day    Minutes of Exercise per Session: 40 min  Stress: Stress Concern Present (08/22/2022)   [employer] of Occupational Health - Occupational Stress Questionnaire    Feeling of Stress : Very much  Social Connections: Moderately Integrated (08/22/2022)   Social Connection and Isolation Panel [NHANES]    Frequency of Communication with Friends and Family: Once a week    Frequency of Social Gatherings with Friends and Family: Once a week    Attends Religious Services: More than 4 times per year    Active Member of [employer] or Organizations: Yes    Attends Banker Meetings: 1 to 4 times per year    Marital Status: Married    Review of Systems: Gen: Denies fever,  chills, cold or flu like symptoms, pre-syncope or syncope.  CV: Denies chest pain, palpitations. Resp: Denies dyspnea at rest, cough. GI: See HPI Heme: See HPI  Physical Exam: There were no vitals taken for this visit. General:   Alert and oriented. No distress noted. Pleasant and cooperative.  Head:  Normocephalic and atraumatic. Eyes:  Conjuctiva clear without scleral icterus. Heart:  S1, S2 present without murmurs appreciated. Lungs:  Clear to auscultation bilaterally. No wheezes, rales, or rhonchi. No distress.  Abdomen:  +BS, soft, non-tender and non-distended. No rebound or guarding. No HSM or masses noted. Msk:  Symmetrical without gross deformities. Normal posture. Extremities:  Without edema. Neurologic:  Alert and  oriented x4 Psych:  Normal  mood and affect.    Assessment:     Plan:  ***   Ermalinda Memos, PA-C [facility] 08/25/2022

## 2022-08-24 ENCOUNTER — Ambulatory Visit: Payer: 59 | Admitting: Nurse Practitioner

## 2022-08-25 ENCOUNTER — Ambulatory Visit: Payer: 59 | Admitting: Family Medicine

## 2022-08-25 ENCOUNTER — Ambulatory Visit: Payer: 59 | Admitting: Gastroenterology

## 2022-08-26 ENCOUNTER — Ambulatory Visit (INDEPENDENT_AMBULATORY_CARE_PROVIDER_SITE_OTHER): Payer: 59 | Admitting: Family Medicine

## 2022-08-26 VITALS — BP 127/89 | HR 94 | Temp 98.9°F | Wt 211.6 lb

## 2022-08-26 DIAGNOSIS — E1165 Type 2 diabetes mellitus with hyperglycemia: Secondary | ICD-10-CM | POA: Diagnosis not present

## 2022-08-26 DIAGNOSIS — E782 Mixed hyperlipidemia: Secondary | ICD-10-CM | POA: Diagnosis not present

## 2022-08-26 DIAGNOSIS — R002 Palpitations: Secondary | ICD-10-CM

## 2022-08-26 DIAGNOSIS — Z7984 Long term (current) use of oral hypoglycemic drugs: Secondary | ICD-10-CM

## 2022-08-26 DIAGNOSIS — I10 Essential (primary) hypertension: Secondary | ICD-10-CM

## 2022-08-26 NOTE — Patient Instructions (Signed)
Continue your medications.  Referral placed.  Follow up in 3-6 months.

## 2022-08-28 DIAGNOSIS — R002 Palpitations: Secondary | ICD-10-CM | POA: Insufficient documentation

## 2022-08-28 NOTE — Assessment & Plan Note (Signed)
Stable. Continue Amlodipine and Lisinopril.

## 2022-08-28 NOTE — Assessment & Plan Note (Signed)
Has upcoming follow up with Endo. Continue current meds.

## 2022-08-28 NOTE — Assessment & Plan Note (Signed)
LDL at goal. Continue lipitor.  

## 2022-08-28 NOTE — Assessment & Plan Note (Signed)
Referring to cardiology for holter.

## 2022-08-28 NOTE — Progress Notes (Signed)
Subjective:  Patient ID: Melanie Mccoy, female    DOB: 02-27-1991  Age: 32 y.o. MRN: [id number]  CC: Chief Complaint  Patient presents with   Follow-up    HPI:  32 year old female with the below mentioned medical history presents for follow up.  Patient reports that she has had a few episodes of palpitations. Heart races. Associated chest discomfort. No SOB.   Hypertension stable on Norvasc and Lisinopril.  Diabetes remains uncontrolled. Follows with Endo. No hypoglycemia. Compliant with Metformin, Ozempic, and Insulin.   LDL at goal on Lipitor.   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/18/2022   Hepatic steatosis 04/25/2022   Mixed hyperlipidemia 04/14/2021   Severe obesity (BMI 35.0-39.9) with comorbidity (HCC) 04/14/2021   Essential hypertension 09/18/2019   Uncontrolled type 2 diabetes mellitus with hyperglycemia (HCC) 01/19/2016   PCOS (polycystic ovarian syndrome) 04/15/2014    Social Hx   Social History   Socioeconomic History   Marital status: Married    Spouse name: Not on file   Number of children: Not on file   Years of education: Not on file   Highest education level: Associate degree: academic program  Occupational History   Not on file  Tobacco Use   Smoking status: Never    Passive exposure: Never   Smokeless tobacco: Never  Vaping Use   Vaping Use: Never used  Substance and Sexual Activity   Alcohol use: No    Comment: Occassionally   Drug use: No   Sexual activity: Yes  Other Topics Concern   Not on file  Social History Narrative   Not on file   Social Determinants of Health   Financial Resource Strain: Low Risk  (08/22/2022)   Overall Financial Resource Strain (CARDIA)    Difficulty of Paying Living Expenses: Not very hard  Food Insecurity: No Food Insecurity (08/22/2022)   Hunger Vital Sign    Worried About Running Out of Food in the Last Year: Never true    Ran Out  of Food in the Last Year: Never true  Transportation Needs: No Transportation Needs (08/22/2022)   PRAPARE - Administrator, Civil Service (Medical): No    Lack of Transportation (Non-Medical): No  Physical Activity: Insufficiently Active (08/22/2022)   Exercise Vital Sign    Days of Exercise per Week: 1 day    Minutes of Exercise per Session: 40 min  Stress: Stress Concern Present (08/22/2022)   [employer] of Occupational Health - Occupational Stress Questionnaire    Feeling of Stress : Very much  Social Connections: Moderately Integrated (08/22/2022)   Social Connection and Isolation Panel [NHANES]    Frequency of Communication with Friends and Family: Once a week    Frequency of Social Gatherings with Friends and Family: Once a week    Attends Religious Services: More than 4 times per year    Active Member of [employer] or Organizations: Yes    Attends Banker Meetings: 1 to 4 times per year    Marital Status: Married    Review of Systems  Constitutional: Negative.   Respiratory: Negative.    Cardiovascular:  Positive for palpitations.   Objective:  BP 127/89   Pulse 94   Temp 98.9 F (37.2 C)   Wt 211 lb 9.6 oz (96 kg)   SpO2 98%   BMI 37.48 kg/m      08/26/2022  3:28 PM 06/16/2022    8:57 AM 05/25/2022    8:51 AM  BP/Weight  Systolic BP 127 120 120  Diastolic BP 89 81 82  Wt. (Lbs) 211.6    BMI 37.48 kg/m2      Physical Exam Vitals and nursing note reviewed.  Constitutional:      General: She is not in acute distress.    Appearance: Normal appearance.  HENT:     Head: Normocephalic and atraumatic.  Eyes:     General:        Right eye: No discharge.        Left eye: No discharge.     Conjunctiva/sclera: Conjunctivae normal.  Cardiovascular:     Rate and Rhythm: Normal rate and regular rhythm.  Pulmonary:     Effort: Pulmonary effort is normal.     Breath sounds: Normal breath sounds. No wheezing, rhonchi or rales.   Neurological:     Mental Status: She is alert.  Psychiatric:        Mood and Affect: Mood normal.        Behavior: Behavior normal.     Lab Results  Component Value Date   WBC 11.7 (H) 05/04/2022   HGB 12.1 05/04/2022   HCT 39.1 05/04/2022   PLT 377 05/04/2022   GLUCOSE 255 (H) 05/09/2022   CHOL 135 04/18/2022   TRIG 272 (H) 04/18/2022   HDL 34 (L) 04/18/2022   LDLCALC 58 04/18/2022   ALT 31 05/04/2022   AST 26 05/04/2022   NA 140 05/09/2022   K 4.5 05/09/2022   CL 100 05/09/2022   CREATININE 0.77 05/09/2022   BUN 16 05/09/2022   CO2 22 05/09/2022   TSH 0.863 04/18/2022   INR 1.0 05/04/2022   HGBA1C 9.3 (H) 05/09/2022   MICROALBUR 80 01/01/2021     Assessment & Plan:   Problem List Items Addressed This Visit       Cardiovascular and Mediastinum   Essential hypertension    Stable. Continue Amlodipine and Lisinopril.        Endocrine   Uncontrolled type 2 diabetes mellitus with hyperglycemia (HCC)    Has upcoming follow up with Endo. Continue current meds.        Other   Palpitations - Primary    Referring to cardiology for holter.       Relevant Orders   Ambulatory referral to Cardiology   Mixed hyperlipidemia    LDL at goal. Continue lipitor.       Follow-up:  3-6 months.  Everlene Other DO [facility]

## 2022-08-30 ENCOUNTER — Encounter: Payer: Self-pay | Admitting: Gastroenterology

## 2022-08-31 ENCOUNTER — Telehealth: Payer: Self-pay | Admitting: Nurse Practitioner

## 2022-08-31 ENCOUNTER — Other Ambulatory Visit: Payer: Self-pay | Admitting: *Deleted

## 2022-08-31 DIAGNOSIS — E119 Type 2 diabetes mellitus without complications: Secondary | ICD-10-CM

## 2022-08-31 NOTE — Telephone Encounter (Signed)
Pt states her meal time insulin (fiasp) is out of stock everywhere that she has called ( even the warehouse ). What can she be changed too in the mean time?  [facility] on [address]

## 2022-08-31 NOTE — Telephone Encounter (Signed)
Any contraindication to switching her to Novolog or Humalog?

## 2022-09-02 MED ORDER — NOVOLOG FLEXPEN 100 UNIT/ML ~~LOC~~ SOPN
25.0000 [IU] | PEN_INJECTOR | Freq: Three times a day (TID) | SUBCUTANEOUS | 0 refills | Status: DC
Start: 2022-09-02 — End: 2022-09-22

## 2022-09-02 NOTE — Telephone Encounter (Signed)
We can switch her to any other alternative, no contraindications in her case.

## 2022-09-02 NOTE — Telephone Encounter (Signed)
Rx for novolog flexpen inject 25-31 units tid with meals sent to [facility] in [place] on [address] to replace fiasp per [name], FNP.

## 2022-09-14 ENCOUNTER — Other Ambulatory Visit: Payer: Self-pay | Admitting: Nurse Practitioner

## 2022-09-22 ENCOUNTER — Ambulatory Visit: Payer: 59 | Admitting: Nurse Practitioner

## 2022-09-22 ENCOUNTER — Encounter: Payer: Self-pay | Admitting: Nurse Practitioner

## 2022-09-22 VITALS — BP 115/80 | HR 75 | Ht 63.0 in | Wt 208.8 lb

## 2022-09-22 DIAGNOSIS — Z794 Long term (current) use of insulin: Secondary | ICD-10-CM | POA: Diagnosis not present

## 2022-09-22 DIAGNOSIS — E1169 Type 2 diabetes mellitus with other specified complication: Secondary | ICD-10-CM | POA: Diagnosis not present

## 2022-09-22 DIAGNOSIS — Z7984 Long term (current) use of oral hypoglycemic drugs: Secondary | ICD-10-CM | POA: Diagnosis not present

## 2022-09-22 DIAGNOSIS — Z7985 Long-term (current) use of injectable non-insulin antidiabetic drugs: Secondary | ICD-10-CM

## 2022-09-22 DIAGNOSIS — I10 Essential (primary) hypertension: Secondary | ICD-10-CM

## 2022-09-22 DIAGNOSIS — E785 Hyperlipidemia, unspecified: Secondary | ICD-10-CM

## 2022-09-22 LAB — POCT GLYCOSYLATED HEMOGLOBIN (HGB A1C): Hemoglobin A1C: 11.2 % — AB (ref 4.0–5.6)

## 2022-09-22 MED ORDER — PEN NEEDLES 31G X 8 MM MISC: 6 refills | Status: DC

## 2022-09-22 MED ORDER — TOUJEO MAX SOLOSTAR 300 UNIT/ML ~~LOC~~ SOPN: 120.0000 [IU] | PEN_INJECTOR | Freq: Every day | SUBCUTANEOUS | 3 refills | Status: DC

## 2022-09-22 MED ORDER — FREESTYLE LIBRE 3 SENSOR MISC: 3 refills | Status: DC

## 2022-09-22 MED ORDER — NOVOLOG FLEXPEN 100 UNIT/ML ~~LOC~~ SOPN
30.0000 [IU] | PEN_INJECTOR | Freq: Three times a day (TID) | SUBCUTANEOUS | 3 refills | Status: DC
Start: 2022-09-22 — End: 2023-01-12

## 2022-09-22 MED ORDER — SEMAGLUTIDE (2 MG/DOSE) 8 MG/3ML ~~LOC~~ SOPN
2.0000 mg | PEN_INJECTOR | SUBCUTANEOUS | 3 refills | Status: DC
Start: 2022-09-22 — End: 2023-06-07

## 2022-09-22 NOTE — Progress Notes (Signed)
Endocrinology Follow Up Visit       09/22/2022, 4:26 PM   Subjective:    Patient ID: Melanie Mccoy, female    DOB: Aug 07, 1990.  Melanie Mccoy is being seen in follow up after being seen in consultation for management of currently uncontrolled symptomatic diabetes requested by  Tommie Sams, DO.   Past Medical History:  Diagnosis Date   Anemia    slightly anemic, iron supplements every 2-3 days   Bartholin cyst    Chlamydia 01/2011   Diabetes mellitus without complication (HCC)    Family history of anesthesia complication    grandmother had spinal with nerve damage   Hepatic adenoma    No pertinent past medical history     Past Surgical History:  Procedure Laterality Date   CESAREAN SECTION N/A 11/22/2012   Procedure: PRIMARY CESAREAN SECTION;  Surgeon: Esmeralda Arthur, MD;  Location: [facility];  Service: Obstetrics;  Laterality: N/A;   CYST REMOVAL TRUNK  2005    Social History   Socioeconomic History   Marital status: Married    Spouse name: Not on file   Number of children: Not on file   Years of education: Not on file   Highest education level: Associate degree: academic program  Occupational History   Not on file  Tobacco Use   Smoking status: Never    Passive exposure: Never   Smokeless tobacco: Never  Vaping Use   Vaping status: Never Used  Substance and Sexual Activity   Alcohol use: No    Comment: Occassionally   Drug use: No   Sexual activity: Yes  Other Topics Concern   Not on file  Social History Narrative   Not on file   Social Determinants of Health   Financial Resource Strain: Low Risk  (08/22/2022)   Overall Financial Resource Strain (CARDIA)    Difficulty of Paying Living Expenses: Not very hard  Food Insecurity: No Food Insecurity (08/22/2022)   Hunger Vital Sign    Worried About Running Out of Food in the Last Year: Never true    Ran Out of Food in the Last  Year: Never true  Transportation Needs: No Transportation Needs (08/22/2022)   PRAPARE - Administrator, Civil Service (Medical): No    Lack of Transportation (Non-Medical): No  Physical Activity: Insufficiently Active (08/22/2022)   Exercise Vital Sign    Days of Exercise per Week: 1 day    Minutes of Exercise per Session: 40 min  Stress: Stress Concern Present (08/22/2022)   [employer] of Occupational Health - Occupational Stress Questionnaire    Feeling of Stress : Very much  Social Connections: Moderately Integrated (08/22/2022)   Social Connection and Isolation Panel [NHANES]    Frequency of Communication with Friends and Family: Once a week    Frequency of Social Gatherings with Friends and Family: Once a week    Attends Religious Services: More than 4 times per year    Active Member of [employer] or Organizations: Yes    Attends Banker Meetings: 1 to 4 times per year    Marital Status: Married    Family History  Problem Relation  Age of Onset   Cancer Other    Heart disease Other    Diabetes Other    Diabetes Mother    Diabetes Father    Diabetes Maternal Grandfather     Outpatient Encounter Medications as of 09/22/2022  Medication Sig   amLODipine (NORVASC) 10 MG tablet Take 1 tablet (10 mg total) by mouth daily.   atorvastatin (LIPITOR) 10 MG tablet TAKE 1 TABLET(10 MG) BY MOUTH DAILY   chlorhexidine (HIBICLENS) 4 % external liquid Apply topically 2 (two) times daily as needed.   Insulin Pen Needle (PEN NEEDLES) 31G X 8 MM MISC Use to inject insulin 4 times daily   lisinopril (ZESTRIL) 40 MG tablet Take 1 tablet (40 mg total) by mouth daily.   metFORMIN (GLUCOPHAGE-XR) 500 MG 24 hr tablet Take 2 tablets (1,000 mg total) by mouth daily with supper.   [DISCONTINUED] Continuous Blood Gluc Sensor (FREESTYLE LIBRE 3 SENSOR) MISC Use to check glucose continuously   [DISCONTINUED] insulin aspart (NOVOLOG FLEXPEN) 100 UNIT/ML FlexPen Inject 25-31 Units  into the skin 3 (three) times daily with meals.   [DISCONTINUED] insulin glargine, 2 Unit Dial, (TOUJEO MAX SOLOSTAR) 300 UNIT/ML Solostar Pen ADMINISTER 100 UNITS UNDER THE SKIN AT BEDTIME   [DISCONTINUED] Insulin Pen Needle (BD PEN NEEDLE NANO 2ND GEN) 32G X 4 MM MISC Use to inject insulin 6 times daily.   [DISCONTINUED] Semaglutide, 2 MG/DOSE, 8 MG/3ML SOPN Inject 2 mg as directed once a week.   Continuous Glucose Sensor (FREESTYLE LIBRE 3 SENSOR) MISC Use to check glucose continuously   insulin aspart (NOVOLOG FLEXPEN) 100 UNIT/ML FlexPen Inject 30-36 Units into the skin 3 (three) times daily with meals.   insulin glargine, 2 Unit Dial, (TOUJEO MAX SOLOSTAR) 300 UNIT/ML Solostar Pen Inject 120 Units into the skin daily.   Semaglutide, 2 MG/DOSE, 8 MG/3ML SOPN Inject 2 mg as directed once a week.   No facility-administered encounter medications on file as of 09/22/2022.    ALLERGIES: Allergies  Allergen Reactions   Bee Venom Swelling    Progressively worse with each sting Other reaction(s): Other   Nickel Rash and Itching   Ibuprofen Hives    VACCINATION STATUS: Immunization History  Administered Date(s) Administered   Tdap 06/26/2006    Diabetes She presents for her follow-up diabetic visit. She has type 2 diabetes mellitus. Onset time: She was diagnosed at approx age of 59. Her disease course has been worsening. There are no hypoglycemic associated symptoms. Associated symptoms include fatigue. Pertinent negatives for diabetes include no blurred vision, no polydipsia, no polyphagia and no polyuria. There are no hypoglycemic complications. Symptoms are stable. There are no diabetic complications. Risk factors for coronary artery disease include diabetes mellitus, dyslipidemia, hypertension, obesity, sedentary lifestyle, family history and stress. Current diabetic treatment includes oral agent (dual therapy) and intensive insulin program (and Ozempic). She is compliant with treatment  some of the time (had a period of time where was out of short acting insulin). Her weight is stable. She is following a generally unhealthy diet. When asked about meal planning, she reported none. She has had a previous visit with a dietitian. She participates in exercise intermittently. Her home blood glucose trend is increasing steadily. Her breakfast blood glucose range is generally >200 mg/dl. Her lunch blood glucose range is generally >200 mg/dl. Her dinner blood glucose range is generally >200 mg/dl. Her bedtime blood glucose range is generally >200 mg/dl. Her overall blood glucose range is >200 mg/dl. (She presents today with her  CGM showing gross hyperglycemia overall.  Her POCT A1c today is 11.2%, increasing form last visit of 9.3%.  she notes she was sick between visits and ran out of her prandial insulin for a period of 2 weeks or so.  Analysis of her CGM shows TIR 0%, TAR 100%, TBR 0% with a GMI of 11%.) An ACE inhibitor/angiotensin II receptor blocker is being taken. She does not see a podiatrist.Eye exam is current.  Hypertension This is a chronic problem. The current episode started more than 1 year ago. The problem has been gradually improving since onset. The problem is uncontrolled. Pertinent negatives include no blurred vision. There are no associated agents to hypertension. Risk factors for coronary artery disease include diabetes mellitus, dyslipidemia, obesity, sedentary lifestyle and stress. Past treatments include calcium channel blockers and ACE inhibitors. The current treatment provides mild improvement. Compliance problems include exercise and diet.   Hyperlipidemia This is a chronic problem. The current episode started more than 1 year ago. The problem is uncontrolled. Recent lipid tests were reviewed and are high. Exacerbating diseases include diabetes and obesity. Factors aggravating her hyperlipidemia include fatty foods. Current antihyperlipidemic treatment includes statins. The  current treatment provides mild improvement of lipids. Compliance problems include adherence to diet and adherence to exercise.  Risk factors for coronary artery disease include diabetes mellitus, dyslipidemia, hypertension, obesity, a sedentary lifestyle and stress.    Review of systems  Constitutional: + Minimally fluctuating body weight,  current Body mass index is 36.99 kg/m. , no fatigue, no subjective hyperthermia, no subjective hypothermia Eyes: no blurry vision, no xerophthalmia ENT: no sore throat, no nodules palpated in throat, no dysphagia/odynophagia, no hoarseness Cardiovascular: no chest pain, no shortness of breath, no palpitations, no leg swelling Respiratory: no cough, no shortness of breath Gastrointestinal: no nausea/vomiting/diarrhea Musculoskeletal: no muscle/joint aches Skin: no rashes, no hyperemia Neurological: no tremors, no numbness, no tingling, no dizziness Psychiatric: no depression, no anxiety  Objective:     BP 115/80 (BP Location: Right Arm, Patient Position: Sitting, Cuff Size: Large)   Pulse 75   Ht 5\' 3"  (1.6 m)   Wt 208 lb 12.8 oz (94.7 kg)   BMI 36.99 kg/m   Wt Readings from Last 3 Encounters:  09/22/22 208 lb 12.8 oz (94.7 kg)  08/26/22 211 lb 9.6 oz (96 kg)  05/25/22 212 lb (96.2 kg)    BP Readings from Last 3 Encounters:  09/22/22 115/80  08/26/22 127/89  06/16/22 120/81     Physical Exam- Limited  Constitutional:  Body mass index is 36.99 kg/m. , not in acute distress, normal state of mind Eyes:  EOMI, no exophthalmos Musculoskeletal: no gross deformities, strength intact in all four extremities, no gross restriction of joint movements Skin:  no rashes, no hyperemia Neurological: no tremor with outstretched hands   Diabetic Foot Exam - Simple   No data filed     CMP ( most recent) CMP     Component Value Date/Time   NA 140 05/09/2022 1448   K 4.5 05/09/2022 1448   CL 100 05/09/2022 1448   CO2 22 05/09/2022 1448    GLUCOSE 255 (H) 05/09/2022 1448   GLUCOSE 211 (H) 05/04/2022 1125   BUN 16 05/09/2022 1448   CREATININE 0.77 05/09/2022 1448   CREATININE 0.77 01/14/2016 1514   CALCIUM 9.4 05/09/2022 1448   PROT 7.3 05/04/2022 1125   PROT 6.7 04/18/2022 1217   ALBUMIN 3.9 05/04/2022 1125   ALBUMIN 4.4 04/18/2022 1217   AST 26  05/04/2022 1125   ALT 31 05/04/2022 1125   ALKPHOS 125 05/04/2022 1125   BILITOT 0.5 05/04/2022 1125   BILITOT 0.3 04/18/2022 1217   GFRNONAA >60 05/04/2022 1125   GFRNONAA >89 01/14/2016 1514   GFRAA 136 01/29/2020 0926   GFRAA >89 01/14/2016 1514     Diabetic Labs (most recent): Lab Results  Component Value Date   HGBA1C 11.2 (A) 09/22/2022   HGBA1C 9.3 (H) 05/09/2022   HGBA1C 10.3 (H) 01/25/2022   MICROALBUR 80 01/01/2021     Lipid Panel ( most recent) Lipid Panel     Component Value Date/Time   CHOL 135 04/18/2022 1217   TRIG 272 (H) 04/18/2022 1217   HDL 34 (L) 04/18/2022 1217   CHOLHDL 4.0 04/18/2022 1217   CHOLHDL 5.1 07/10/2019 0506   VLDL 32 07/10/2019 0506   LDLCALC 58 04/18/2022 1217   LABVLDL 43 (H) 04/18/2022 1217      Lab Results  Component Value Date   TSH 0.863 04/18/2022   TSH 3.180 12/31/2020   TSH 2.00 01/14/2016   FREET4 1.07 04/18/2022   FREET4 1.23 12/31/2020           Assessment & Plan:   1) Type 2 diabetes mellitus without complication, without long-term current use of insulin (HCC)  - Rhen B Lili Harts has currently uncontrolled symptomatic type 2 DM since 32 years of age.   Recent labs reviewed.  She presents today with her CGM showing gross hyperglycemia overall.  Her POCT A1c today is 11.2%, increasing form last visit of 9.3%.  she notes she was sick between visits and ran out of her prandial insulin for a period of 2 weeks or so.  Analysis of her CGM shows TIR 0%, TAR 100%, TBR 0% with a GMI of 11%.  - I had a long discussion with her about the progressive nature of diabetes and the pathology behind its  complications.  -She does not currently have any complications from diabetes but she remains at a high risk for more acute and chronic complications which include CAD, CVA, CKD, retinopathy, and neuropathy. These are all discussed in detail with her.  - Nutritional counseling repeated at each appointment due to patients tendency to fall back in to old habits.  - The patient admits there is a room for improvement in their diet and drink choices. -  Suggestion is made for the patient to avoid simple carbohydrates from their diet including Cakes, Sweet Desserts / Pastries, Ice Cream, Soda (diet and regular), Sweet Tea, Candies, Chips, Cookies, Sweet Pastries, Store Bought Juices, Alcohol in Excess of 1-2 drinks a day, Artificial Sweeteners, Coffee Creamer, and "Sugar-free" Products. This will help patient to have stable blood glucose profile and potentially avoid unintended weight gain.   - I encouraged the patient to switch to unprocessed or minimally processed complex starch and increased protein intake (animal or plant source), fruits, and vegetables.   - Patient is advised to stick to a routine mealtimes to eat 3 meals a day and avoid unnecessary snacks (to snack only to correct hypoglycemia).  - I have approached her with the following individualized plan to manage  her diabetes and patient agrees:   -She is advised to continue her current dose of Toujeo 120 units SQ nightly, continue Novolog 30-36 units TID with meals if glucose is above 90 and she is eating (Specific instructions on how to titrate insulin dosage based on glucose readings given to patient in writing).  She can also  continue Ozempic 2 mg SQ weekly and Metformin 500 mg ER twice daily after meals (to avoid GI upset).  She still admits to dietary indiscretions which we discussed about avoiding today.   -she is encouraged to consistently monitor blood glucose 4 times daily (using her CGM), and to call the clinic if she has readings  less than 70 or above 300 for 3 tests in a row.  - she is warned not to take insulin without proper monitoring per orders. - Adjustment parameters are given to her for hypo and hyperglycemia in writing.  - Specific targets for  A1c;  LDL, HDL,  and Triglycerides were discussed with the patient.  2) Blood Pressure /Hypertension:  Her blood pressure is not controlled to target.  She is advised to continue Amlodipine 10 mg po daily, and Lisinopril 40 mg po daily.   3) Lipids/Hyperlipidemia:    Her most recent lipid panel from 04/18/22 shows improved and controlled LDL of 58 and elevated but improved triglycerides of 272.   She is advised to continue Lipitor 10 mg po daily at bedtime.  Side effects and precautions discussed with her.  She was advised to avoid fried foods and butter.    4)  Weight/Diet:  her Body mass index is 36.99 kg/m.  - clearly complicating her diabetes care.   she is a candidate for weight loss. I discussed with her the fact that loss of 5 - 10% of her  current body weight will have the most impact on her diabetes management.  Exercise, and detailed carbohydrates information provided  -  detailed on discharge instructions.  5) Chronic Care/Health Maintenance: -she is on ACEI/ARB and Statin medications and is encouraged to initiate and continue to follow up with Ophthalmology, Dentist,  Podiatrist at least yearly or according to recommendations, and advised to stay away from smoking. I have recommended yearly flu vaccine and pneumonia vaccine at least every 5 years; moderate intensity exercise for up to 150 minutes weekly; and  sleep for at least 7 hours a day.  - she is advised to maintain close follow up with Tommie Sams, DO for primary care needs, as well as her other providers for optimal and coordinated care.      I spent  43  minutes in the care of the patient today including review of labs from CMP, Lipids, Thyroid Function, Hematology (current and previous  including abstractions from other facilities); face-to-face time discussing  her blood glucose readings/logs, discussing hypoglycemia and hyperglycemia episodes and symptoms, medications doses, her options of short and long term treatment based on the latest standards of care / guidelines;  discussion about incorporating lifestyle medicine;  and documenting the encounter. Risk reduction counseling performed per USPSTF guidelines to reduce obesity and cardiovascular risk factors.     Please refer to Patient Instructions for Blood Glucose Monitoring and Insulin/Medications Dosing Guide"  in media tab for additional information. Please  also refer to " Patient Self Inventory" in the Media  tab for reviewed elements of pertinent patient history.  Gladstone Lighter Sams participated in the discussions, expressed understanding, and voiced agreement with the above plans.  All questions were answered to her satisfaction. she is encouraged to contact clinic should she have any questions or concerns prior to her return visit.     Follow up plan: - Return in about 3 months (around 12/23/2022) for Diabetes F/U with A1c in office, No previsit labs, Bring meter and logs.  [name], FNP-BC [facility] [address] [place], [place] [address] Phone: [phone] Fax: [phone]  09/22/2022, 4:26 PM

## 2022-10-11 ENCOUNTER — Other Ambulatory Visit: Payer: Self-pay | Admitting: Nurse Practitioner

## 2022-10-11 DIAGNOSIS — E1169 Type 2 diabetes mellitus with other specified complication: Secondary | ICD-10-CM

## 2022-10-21 ENCOUNTER — Encounter: Payer: Self-pay | Admitting: Internal Medicine

## 2022-10-21 ENCOUNTER — Ambulatory Visit: Payer: 59 | Attending: Internal Medicine | Admitting: Internal Medicine

## 2022-10-21 VITALS — BP 118/78 | HR 98 | Ht 63.5 in | Wt 208.0 lb

## 2022-10-21 DIAGNOSIS — I10 Essential (primary) hypertension: Secondary | ICD-10-CM | POA: Diagnosis not present

## 2022-10-21 NOTE — Progress Notes (Signed)
Cardiology Office Note  Date: 10/21/2022   ID: Melanie Mccoy, DOB Mar 18, 1990, MRN [id number]  PCP:  Tommie Sams, DO  Cardiologist:  Marjo Bicker, MD Electrophysiologist:  None   History of Present Illness: Melanie Mccoy is a 32 y.o. female known to have HTN, DM 2, HLD was referred to cardiology clinic for evaluation of palpitations.  Patient had SOB, chest pain and palpitations associated with severe fatigue occurring almost daily in May and June 2024 which gradually resolved and the last episode of the above symptoms were 1 month ago. She also started to take vitamin D supplements recently and was off of it for quite a while.  No recurrences of chest pain, DOE, palpitations in the last 1 month.  No dizziness, syncope.  Does not drink energy drinks, coffee/tea, alcohol or marijuana.   Past Medical History:  Diagnosis Date   Anemia    slightly anemic, iron supplements every 2-3 days   Bartholin cyst    Chlamydia 01/2011   Diabetes mellitus without complication (HCC)    Family history of anesthesia complication    grandmother had spinal with nerve damage   Hepatic adenoma    No pertinent past medical history     Past Surgical History:  Procedure Laterality Date   CESAREAN SECTION N/A 11/22/2012   Procedure: PRIMARY CESAREAN SECTION;  Surgeon: Esmeralda Arthur, MD;  Location: [facility];  Service: Obstetrics;  Laterality: N/A;   CYST REMOVAL TRUNK  2005    Current Outpatient Medications  Medication Sig Dispense Refill   amLODipine (NORVASC) 10 MG tablet Take 1 tablet (10 mg total) by mouth daily. 90 tablet 3   atorvastatin (LIPITOR) 10 MG tablet TAKE 1 TABLET(10 MG) BY MOUTH DAILY 90 tablet 0   chlorhexidine (HIBICLENS) 4 % external liquid Apply topically 2 (two) times daily as needed. 120 mL 0   Continuous Glucose Sensor (FREESTYLE LIBRE 3 SENSOR) MISC Use to check glucose continuously 6 each 3   insulin aspart (NOVOLOG FLEXPEN) 100 UNIT/ML FlexPen Inject 30-36  Units into the skin 3 (three) times daily with meals. 90 mL 3   insulin glargine, 2 Unit Dial, (TOUJEO MAX SOLOSTAR) 300 UNIT/ML Solostar Pen Inject 120 Units into the skin daily. 36 mL 3   Insulin Pen Needle (PEN NEEDLES) 31G X 8 MM MISC Use to inject insulin 4 times daily 300 each 6   lisinopril (ZESTRIL) 40 MG tablet Take 1 tablet (40 mg total) by mouth daily. 90 tablet 3   metFORMIN (GLUCOPHAGE-XR) 500 MG 24 hr tablet Take 2 tablets (1,000 mg total) by mouth daily with supper. 180 tablet 3   Semaglutide, 2 MG/DOSE, 8 MG/3ML SOPN Inject 2 mg as directed once a week. 6 mL 3   No current facility-administered medications for this visit.   Allergies:  Bee venom, Nickel, and Ibuprofen   Social History: The patient  reports that she has never smoked. She has never been exposed to tobacco smoke. She has never used smokeless tobacco. She reports that she does not drink alcohol and does not use drugs.   Family History: The patient's family history includes Cancer in an other family member; Diabetes in her father, maternal grandfather, mother, and another family member; Heart disease in an other family member.   ROS:  Please see the history of present illness. Otherwise, complete review of systems is positive for none.  All other systems are reviewed and negative.   Physical Exam: VS:  BP  118/78   Pulse 98   Ht 5' 3.5" (1.613 m)   Wt 208 lb (94.3 kg)   SpO2 97%   BMI 36.27 kg/m , BMI Body mass index is 36.27 kg/m.  Wt Readings from Last 3 Encounters:  10/21/22 208 lb (94.3 kg)  09/22/22 208 lb 12.8 oz (94.7 kg)  08/26/22 211 lb 9.6 oz (96 kg)    General: Patient appears comfortable at rest. HEENT: Conjunctiva and lids normal, oropharynx clear with moist mucosa. Neck: Supple, no elevated JVP or carotid bruits, no thyromegaly. Lungs: Clear to auscultation, nonlabored breathing at rest. Cardiac: Regular rate and rhythm, no S3 or significant systolic murmur, no pericardial rub. Abdomen:  Soft, nontender, no hepatomegaly, bowel sounds present, no guarding or rebound. Extremities: No pitting edema, distal pulses 2+. Skin: Warm and dry. Musculoskeletal: No kyphosis. Neuropsychiatric: Alert and oriented x3, affect grossly appropriate.  Recent Labwork: 04/18/2022: TSH 0.863 05/04/2022: ALT 31; AST 26; Hemoglobin 12.1; Platelets 377 05/09/2022: BUN 16; Creatinine, Ser 0.77; Potassium 4.5; Sodium 140     Component Value Date/Time   CHOL 135 04/18/2022 1217   TRIG 272 (H) 04/18/2022 1217   HDL 34 (L) 04/18/2022 1217   CHOLHDL 4.0 04/18/2022 1217   CHOLHDL 5.1 07/10/2019 0506   VLDL 32 07/10/2019 0506   LDLCALC 58 04/18/2022 1217     Assessment and Plan:  1.  Palpitations: Patient had SOB, chest pain and palpitations associated with severe fatigue occurring almost daily in May and June 2024 which gradually resolved and the last episode of the above symptoms were 1 month ago. She also started to take vitamin D supplements recently and was off of it for quite a while.  Probably the above symptoms could be related to severe vitamin D deficiency which resolved with repletion of vitamin D levels.  Does not drink coffee/tea, energy drinks, alcohol or marijuana.  If she has recurrent palpitations, she is instructed to call the clinic for 2-week event monitor.  2.   HTN, controlled: Continue amlodipine 10 mg once daily and lisinopril 40 mg once daily, follows up with PCP.  3.   HLD, at goal: Continue atorvastatin 10 mg nightly, goal LDL less than 100.  She has elevated triglycerides.  Follows up with PCP.  Diet and excise counseling.    Medication Adjustments/Labs and Tests Ordered: Current medicines are reviewed at length with the patient today.  Concerns regarding medicines are outlined above.    Disposition:  Follow up PRN  Signed, Taccara Bushnell Verne Spurr, MD, 10/21/2022 4:12 PM    [facility] [address], [place], [place] [address]

## 2022-10-21 NOTE — Patient Instructions (Signed)
Medication Instructions:  Your physician recommends that you continue on your current medications as directed. Please refer to the Current Medication list given to you today.  *If you need a refill on your cardiac medications before your next appointment, please call your pharmacy*   Lab Work: None If you have labs (blood work) drawn today and your tests are completely normal, you will receive your results only by: MyChart Message (if you have MyChart) OR A paper copy in the mail If you have any lab test that is abnormal or we need to change your treatment, we will call you to review the results.   Testing/Procedures: None   Follow-Up: At [facility], you and your health needs are our priority.  As part of our continuing mission to provide you with exceptional heart care, we have created designated Provider Care Teams.  These Care Teams include your primary Cardiologist (physician) and Advanced Practice Providers (APPs -  Physician Assistants and Nurse Practitioners) who all work together to provide you with the care you need, when you need it.  We recommend signing up for the patient portal called "MyChart".  Sign up information is provided on this After Visit Summary.  MyChart is used to connect with patients for Virtual Visits (Telemedicine).  Patients are able to view lab/test results, encounter notes, upcoming appointments, etc.  Non-urgent messages can be sent to your provider as well.   To learn more about what you can do with MyChart, go to [email or web address].    Your next appointment:   Follow up as needed.    Provider:   Vishnu Mallipeddi, MD    Other Instructions    

## 2022-12-27 ENCOUNTER — Encounter: Payer: Self-pay | Admitting: Nurse Practitioner

## 2022-12-27 ENCOUNTER — Ambulatory Visit: Payer: 59 | Admitting: Nurse Practitioner

## 2022-12-27 VITALS — BP 99/64 | HR 68 | Ht 63.5 in | Wt 206.4 lb

## 2022-12-27 DIAGNOSIS — Z7984 Long term (current) use of oral hypoglycemic drugs: Secondary | ICD-10-CM | POA: Diagnosis not present

## 2022-12-27 DIAGNOSIS — Z7985 Long-term (current) use of injectable non-insulin antidiabetic drugs: Secondary | ICD-10-CM | POA: Diagnosis not present

## 2022-12-27 DIAGNOSIS — I10 Essential (primary) hypertension: Secondary | ICD-10-CM

## 2022-12-27 DIAGNOSIS — E1169 Type 2 diabetes mellitus with other specified complication: Secondary | ICD-10-CM

## 2022-12-27 DIAGNOSIS — E119 Type 2 diabetes mellitus without complications: Secondary | ICD-10-CM

## 2022-12-27 DIAGNOSIS — E785 Hyperlipidemia, unspecified: Secondary | ICD-10-CM

## 2022-12-27 DIAGNOSIS — Z794 Long term (current) use of insulin: Secondary | ICD-10-CM

## 2022-12-27 LAB — POCT GLYCOSYLATED HEMOGLOBIN (HGB A1C): Hemoglobin A1C: 11.8 % — AB (ref 4.0–5.6)

## 2022-12-27 MED ORDER — METFORMIN HCL ER 500 MG PO TB24: 1000.0000 mg | ORAL_TABLET | Freq: Every day | ORAL | 3 refills | Status: DC

## 2022-12-27 MED ORDER — CEQUR SIMPLICITY 2U DEVI: 6 refills | Status: DC

## 2022-12-27 MED ORDER — PEN NEEDLES 31G X 8 MM MISC: 6 refills | Status: DC

## 2022-12-27 MED ORDER — CEQUR SIMPLICITY INSERTER MISC: 0 refills | Status: DC

## 2022-12-27 NOTE — Progress Notes (Signed)
Endocrinology Follow Up Visit       12/27/2022, 4:22 PM   Subjective:    Patient ID: Melanie Mccoy, female    DOB: 03-Feb-1991.  Melanie Mccoy is being seen in follow up after being seen in consultation for management of currently uncontrolled symptomatic diabetes requested by  Tommie Sams, DO.   Past Medical History:  Diagnosis Date   Anemia    slightly anemic, iron supplements every 2-3 days   Bartholin cyst    Chlamydia 01/2011   Diabetes mellitus without complication (HCC)    Family history of anesthesia complication    grandmother had spinal with nerve damage   Hepatic adenoma    No pertinent past medical history     Past Surgical History:  Procedure Laterality Date   CESAREAN SECTION N/A 11/22/2012   Procedure: PRIMARY CESAREAN SECTION;  Surgeon: Esmeralda Arthur, MD;  Location: [facility];  Service: Obstetrics;  Laterality: N/A;   CYST REMOVAL TRUNK  2005    Social History   Socioeconomic History   Marital status: Married    Spouse name: Not on file   Number of children: Not on file   Years of education: Not on file   Highest education level: Associate degree: academic program  Occupational History   Not on file  Tobacco Use   Smoking status: Never    Passive exposure: Never   Smokeless tobacco: Never  Vaping Use   Vaping status: Never Used  Substance and Sexual Activity   Alcohol use: No    Comment: Occassionally   Drug use: No   Sexual activity: Yes  Other Topics Concern   Not on file  Social History Narrative   Not on file   Social Determinants of Health   Financial Resource Strain: Low Risk  (08/22/2022)   Overall Financial Resource Strain (CARDIA)    Difficulty of Paying Living Expenses: Not very hard  Food Insecurity: No Food Insecurity (08/22/2022)   Hunger Vital Sign    Worried About Running Out of Food in the Last Year: Never true    Ran Out of Food in the Last  Year: Never true  Transportation Needs: No Transportation Needs (08/22/2022)   PRAPARE - Administrator, Civil Service (Medical): No    Lack of Transportation (Non-Medical): No  Physical Activity: Insufficiently Active (08/22/2022)   Exercise Vital Sign    Days of Exercise per Week: 1 day    Minutes of Exercise per Session: 40 min  Stress: Stress Concern Present (08/22/2022)   [employer] of Occupational Health - Occupational Stress Questionnaire    Feeling of Stress : Very much  Social Connections: Moderately Integrated (08/22/2022)   Social Connection and Isolation Panel [NHANES]    Frequency of Communication with Friends and Family: Once a week    Frequency of Social Gatherings with Friends and Family: Once a week    Attends Religious Services: More than 4 times per year    Active Member of [employer] or Organizations: Yes    Attends Banker Meetings: 1 to 4 times per year    Marital Status: Married    Family History  Problem Relation  Age of Onset   Cancer Other    Heart disease Other    Diabetes Other    Diabetes Mother    Diabetes Father    Diabetes Maternal Grandfather     Outpatient Encounter Medications as of 12/27/2022  Medication Sig   amLODipine (NORVASC) 10 MG tablet Take 1 tablet (10 mg total) by mouth daily.   atorvastatin (LIPITOR) 10 MG tablet TAKE 1 TABLET(10 MG) BY MOUTH DAILY   chlorhexidine (HIBICLENS) 4 % external liquid Apply topically 2 (two) times daily as needed.   Continuous Glucose Sensor (FREESTYLE LIBRE 3 SENSOR) MISC Use to check glucose continuously   injection device for insulin (CEQUR SIMPLICITY 2U) DEVI Change every 2-3 days as directed.   Injection Device for Insulin (CEQUR SIMPLICITY INSERTER) MISC Use to change device every 2-3 days as directed   insulin aspart (NOVOLOG FLEXPEN) 100 UNIT/ML FlexPen Inject 30-36 Units into the skin 3 (three) times daily with meals.   insulin glargine, 2 Unit Dial, (TOUJEO MAX SOLOSTAR)  300 UNIT/ML Solostar Pen Inject 120 Units into the skin daily.   lisinopril (ZESTRIL) 40 MG tablet Take 1 tablet (40 mg total) by mouth daily.   Semaglutide, 2 MG/DOSE, 8 MG/3ML SOPN Inject 2 mg as directed once a week.   [DISCONTINUED] Insulin Pen Needle (PEN NEEDLES) 31G X 8 MM MISC Use to inject insulin 4 times daily   [DISCONTINUED] metFORMIN (GLUCOPHAGE-XR) 500 MG 24 hr tablet Take 2 tablets (1,000 mg total) by mouth daily with supper.   Insulin Pen Needle (PEN NEEDLES) 31G X 8 MM MISC Use to inject insulin 4 times daily   metFORMIN (GLUCOPHAGE-XR) 500 MG 24 hr tablet Take 2 tablets (1,000 mg total) by mouth daily with supper.   No facility-administered encounter medications on file as of 12/27/2022.    ALLERGIES: Allergies  Allergen Reactions   Bee Venom Swelling    Progressively worse with each sting Other reaction(s): Other   Nickel Rash and Itching   Ibuprofen Hives    VACCINATION STATUS: Immunization History  Administered Date(s) Administered   Tdap 06/26/2006    Diabetes She presents for her follow-up diabetic visit. She has type 2 diabetes mellitus. Onset time: She was diagnosed at approx age of 80. Her disease course has been worsening. There are no hypoglycemic associated symptoms. Associated symptoms include fatigue. Pertinent negatives for diabetes include no blurred vision, no polydipsia, no polyphagia and no polyuria. There are no hypoglycemic complications. Symptoms are stable. There are no diabetic complications. Risk factors for coronary artery disease include diabetes mellitus, dyslipidemia, hypertension, obesity, sedentary lifestyle, family history and stress. Current diabetic treatment includes oral agent (dual therapy) and intensive insulin program (and Ozempic). She is compliant with treatment some of the time (had a period of time where was out of short acting insulin). Her weight is stable. She is following a generally unhealthy diet. When asked about meal  planning, she reported none. She has had a previous visit with a dietitian. She participates in exercise intermittently. Her home blood glucose trend is increasing steadily. Her breakfast blood glucose range is generally >200 mg/dl. Her lunch blood glucose range is generally >200 mg/dl. Her dinner blood glucose range is generally >200 mg/dl. Her bedtime blood glucose range is generally >200 mg/dl. Her overall blood glucose range is >200 mg/dl. (She presents today with her CGM showing gross hyperglycemia overall.  Her POCT A1c today is 11.8%, increasing form last visit of 11.2%.  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  She admits she skips her Novolog quite frequently as she does not have time to sit down and eat a full meal while at work.  Analysis of her CGM shows TIR 0%, TAR 100%, TBR 0% with a GMI of 10.9%.) An ACE inhibitor/angiotensin II receptor blocker is being taken. She does not see a podiatrist.Eye exam is current.  Hypertension This is a chronic problem. The current episode started more than 1 year ago. The problem has been gradually improving since onset. The problem is uncontrolled. Pertinent negatives include no blurred vision. There are no associated agents to hypertension. Risk factors for coronary artery disease include diabetes mellitus, dyslipidemia, obesity, sedentary lifestyle and stress. Past treatments include calcium channel blockers and ACE inhibitors. The current treatment provides mild improvement. Compliance problems include exercise and diet.   Hyperlipidemia This is a chronic problem. The current episode started more than 1 year ago. The problem is uncontrolled. Recent lipid tests were reviewed and are high. Exacerbating diseases include diabetes and obesity. Factors aggravating her hyperlipidemia include fatty foods. Current antihyperlipidemic treatment includes statins. The current treatment provides mild  improvement of lipids. Compliance problems include adherence to diet and adherence to exercise.  Risk factors for coronary artery disease include diabetes mellitus, dyslipidemia, hypertension, obesity, a sedentary lifestyle and stress.    Review of systems  Constitutional: + Minimally fluctuating body weight,  current Body mass index is 35.99 kg/m. , no fatigue, no subjective hyperthermia, no subjective hypothermia Eyes: no blurry vision, no xerophthalmia ENT: no sore throat, no nodules palpated in throat, no dysphagia/odynophagia, no hoarseness Cardiovascular: no chest pain, no shortness of breath, no palpitations, no leg swelling Respiratory: no cough, no shortness of breath Gastrointestinal: no nausea/vomiting/diarrhea Musculoskeletal: no muscle/joint aches Skin: no rashes, no hyperemia Neurological: no tremors, no numbness, no tingling, no dizziness Psychiatric: no depression, no anxiety  Objective:     BP 99/64 (BP Location: Left Arm, Patient Position: Sitting, Cuff Size: Large)   Pulse 68   Ht 5' 3.5" (1.613 m)   Wt 206 lb 6.4 oz (93.6 kg)   BMI 35.99 kg/m   Wt Readings from Last 3 Encounters:  12/27/22 206 lb 6.4 oz (93.6 kg)  10/21/22 208 lb (94.3 kg)  09/22/22 208 lb 12.8 oz (94.7 kg)    BP Readings from Last 3 Encounters:  12/27/22 99/64  10/21/22 118/78  09/22/22 115/80     Physical Exam- Limited  Constitutional:  Body mass index is 35.99 kg/m. , not in acute distress, normal state of mind Eyes:  EOMI, no exophthalmos Musculoskeletal: no gross deformities, strength intact in all four extremities, no gross restriction of joint movements Skin:  no rashes, no hyperemia Neurological: no tremor with outstretched hands   Diabetic Foot Exam - Simple   No data filed     CMP ( most recent) CMP     Component Value Date/Time   NA 140 05/09/2022 1448   K 4.5 05/09/2022 1448   CL 100 05/09/2022 1448   CO2 22 05/09/2022 1448   GLUCOSE 255 (H) 05/09/2022 1448    GLUCOSE 211 (H) 05/04/2022 1125   BUN 16 05/09/2022 1448   CREATININE 0.77 05/09/2022 1448   CREATININE 0.77 01/14/2016 1514   CALCIUM 9.4 05/09/2022 1448   PROT 7.3 05/04/2022 1125   PROT 6.7 04/18/2022 1217   ALBUMIN 3.9 05/04/2022 1125   ALBUMIN 4.4 04/18/2022 1217   AST 26 05/04/2022 1125  ALT 31 05/04/2022 1125   ALKPHOS 125 05/04/2022 1125   BILITOT 0.5 05/04/2022 1125   BILITOT 0.3 04/18/2022 1217   GFRNONAA >60 05/04/2022 1125   GFRNONAA >89 01/14/2016 1514   GFRAA 136 01/29/2020 0926   GFRAA >89 01/14/2016 1514     Diabetic Labs (most recent): Lab Results  Component Value Date   HGBA1C 11.8 (A) 12/27/2022   HGBA1C 11.2 (A) 09/22/2022   HGBA1C 9.3 (H) 05/09/2022   MICROALBUR 80 01/01/2021     Lipid Panel ( most recent) Lipid Panel     Component Value Date/Time   CHOL 135 04/18/2022 1217   TRIG 272 (H) 04/18/2022 1217   HDL 34 (L) 04/18/2022 1217   CHOLHDL 4.0 04/18/2022 1217   CHOLHDL 5.1 07/10/2019 0506   VLDL 32 07/10/2019 0506   LDLCALC 58 04/18/2022 1217   LABVLDL 43 (H) 04/18/2022 1217      Lab Results  Component Value Date   TSH 0.863 04/18/2022   TSH 3.180 12/31/2020   TSH 2.00 01/14/2016   FREET4 1.07 04/18/2022   FREET4 1.23 12/31/2020           Assessment & Plan:   1) Type 2 diabetes mellitus without complication, without long-term current use of insulin (HCC)  - Farris B Dela Heimann has currently uncontrolled symptomatic type 2 DM since 33 years of age.   Recent labs reviewed.  She presents today with her CGM showing gross hyperglycemia overall.  Her POCT A1c today is 11.8%, increasing form last visit of 11.2%.  She notes she has changed jobs, now working full time at [employer] as her previous job was too stressful.  She admits she skips her Novolog quite frequently as she does not have time to sit down and eat a full meal while at work.  Analysis of her CGM shows TIR 0%, TAR 100%, TBR 0% with a GMI of 10.9%.  - I had a  long discussion with her about the progressive nature of diabetes and the pathology behind its complications.  -She does not currently have any complications from diabetes but she remains at a high risk for more acute and chronic complications which include CAD, CVA, CKD, retinopathy, and neuropathy. These are all discussed in detail with her.  - Nutritional counseling repeated at each appointment due to patients tendency to fall back in to old habits.  - The patient admits there is a room for improvement in their diet and drink choices. -  Suggestion is made for the patient to avoid simple carbohydrates from their diet including Cakes, Sweet Desserts / Pastries, Ice Cream, Soda (diet and regular), Sweet Tea, Candies, Chips, Cookies, Sweet Pastries, Store Bought Juices, Alcohol in Excess of 1-2 drinks a day, Artificial Sweeteners, Coffee Creamer, and "Sugar-free" Products. This will help patient to have stable blood glucose profile and potentially avoid unintended weight gain.   - I encouraged the patient to switch to unprocessed or minimally processed complex starch and increased protein intake (animal or plant source), fruits, and vegetables.   - Patient is advised to stick to a routine mealtimes to eat 3 meals a day and avoid unnecessary snacks (to snack only to correct hypoglycemia).  - I have approached her with the following individualized plan to manage  her diabetes and patient agrees:   -She is advised to continue her current dose of Toujeo 120 units SQ nightly, continue Novolog 30-36 units TID with meals if glucose is above 90 and she is eating (Specific  instructions on how to titrate insulin dosage based on glucose readings given to patient in writing).  She can also continue Ozempic 2 mg SQ weekly and Metformin 500 mg ER twice daily after meals (to avoid GI upset).    -Will try sending in Cequr device to see if she is more consistent with taking her meal time insulin with a wearable  insulin injection device.  She is advised to let me know when she switches to this device.  I advised her to take 4 units with a snack, with small meal take 6 units, medium meal take 10 units, and large meal take 14 units.  If she likes this device, will send in insulin vials for future use (instead of pens).  I also gave her some skin tac wipes to try and see if it helps hold her CGM sensor and the Cequr device in place for the desired length of time.  -she is encouraged to consistently monitor blood glucose 4 times daily (using her CGM), and to call the clinic if she has readings less than 70 or above 300 for 3 tests in a row.  - she is warned not to take insulin without proper monitoring per orders. - Adjustment parameters are given to her for hypo and hyperglycemia in writing.  - Specific targets for  A1c;  LDL, HDL,  and Triglycerides were discussed with the patient.  2) Blood Pressure /Hypertension:  Her blood pressure is controlled to target.  She is advised to continue Amlodipine 10 mg po daily, and Lisinopril 40 mg po daily.   3) Lipids/Hyperlipidemia:    Her most recent lipid panel from 04/18/22 shows improved and controlled LDL of 58 and elevated but improved triglycerides of 272.   She is advised to continue Lipitor 10 mg po daily at bedtime.  Side effects and precautions discussed with her.  She was advised to avoid fried foods and butter.    4)  Weight/Diet:  her Body mass index is 35.99 kg/m.  - clearly complicating her diabetes care.   she is a candidate for weight loss. I discussed with her the fact that loss of 5 - 10% of her  current body weight will have the most impact on her diabetes management.  Exercise, and detailed carbohydrates information provided  -  detailed on discharge instructions.  5) Chronic Care/Health Maintenance: -she is on ACEI/ARB and Statin medications and is encouraged to initiate and continue to follow up with Ophthalmology, Dentist,  Podiatrist at least  yearly or according to recommendations, and advised to stay away from smoking. I have recommended yearly flu vaccine and pneumonia vaccine at least every 5 years; moderate intensity exercise for up to 150 minutes weekly; and  sleep for at least 7 hours a day.  - she is advised to maintain close follow up with Tommie Sams, DO for primary care needs, as well as her other providers for optimal and coordinated care.      I spent  30  minutes in the care of the patient today including review of labs from CMP, Lipids, Thyroid Function, Hematology (current and previous including abstractions from other facilities); face-to-face time discussing  her blood glucose readings/logs, discussing hypoglycemia and hyperglycemia episodes and symptoms, medications doses, her options of short and long term treatment based on the latest standards of care / guidelines;  discussion about incorporating lifestyle medicine;  and documenting the encounter. Risk reduction counseling performed per USPSTF guidelines to reduce obesity and cardiovascular risk  factors.     Please refer to Patient Instructions for Blood Glucose Monitoring and Insulin/Medications Dosing Guide"  in media tab for additional information. Please  also refer to " Patient Self Inventory" in the Media  tab for reviewed elements of pertinent patient history.  Melanie Mccoy participated in the discussions, expressed understanding, and voiced agreement with the above plans.  All questions were answered to her satisfaction. she is encouraged to contact clinic should she have any questions or concerns prior to her return visit.    Follow up plan: - Return in about 3 months (around 03/29/2023) for Diabetes F/U with A1c in office, No previsit labs, Bring meter and logs.  Melanie Mccoy, [facility] [address] [place], [place] [address] Phone: [phone] Fax: [phone]  12/27/2022, 4:22 PM

## 2023-01-09 ENCOUNTER — Encounter: Payer: Self-pay | Admitting: Family Medicine

## 2023-01-09 DIAGNOSIS — I10 Essential (primary) hypertension: Secondary | ICD-10-CM

## 2023-01-09 DIAGNOSIS — E119 Type 2 diabetes mellitus without complications: Secondary | ICD-10-CM

## 2023-01-09 DIAGNOSIS — E782 Mixed hyperlipidemia: Secondary | ICD-10-CM

## 2023-01-10 ENCOUNTER — Other Ambulatory Visit (HOSPITAL_COMMUNITY): Payer: Self-pay

## 2023-01-10 ENCOUNTER — Telehealth: Payer: Self-pay | Admitting: Pharmacy Technician

## 2023-01-10 NOTE — Telephone Encounter (Signed)
Pharmacy Patient Advocate Encounter   Received notification from CoverMyMeds that prior authorization for Novolog is required/requested.   Insurance verification completed.   The patient is insured through [facility] .   Per test claim:  Humalog, unbranded humalog or Lyumjev is preferred by the insurance.  If suggested medication is appropriate, Please send in a new RX and discontinue this one. If not, please advise as to why it's not appropriate so that we may request a Prior Authorization.  All 3 of those are $0.

## 2023-01-12 LAB — CBC WITH DIFFERENTIAL/PLATELET
Basophils Absolute: 0.1 10*3/uL (ref 0.0–0.2)
Basos: 0 %
EOS (ABSOLUTE): 0.1 10*3/uL (ref 0.0–0.4)
Eos: 1 %
Hematocrit: 38.4 % (ref 34.0–46.6)
Hemoglobin: 11.7 g/dL (ref 11.1–15.9)
Immature Grans (Abs): 0.1 10*3/uL (ref 0.0–0.1)
Immature Granulocytes: 1 %
Lymphocytes Absolute: 2 10*3/uL (ref 0.7–3.1)
Lymphs: 15 %
MCH: 25.5 pg — ABNORMAL LOW (ref 26.6–33.0)
MCHC: 30.5 g/dL — ABNORMAL LOW (ref 31.5–35.7)
MCV: 84 fL (ref 79–97)
Monocytes Absolute: 1 10*3/uL — ABNORMAL HIGH (ref 0.1–0.9)
Monocytes: 7 %
Neutrophils Absolute: 10.1 10*3/uL — ABNORMAL HIGH (ref 1.4–7.0)
Neutrophils: 76 %
Platelets: 434 10*3/uL (ref 150–450)
RBC: 4.58 x10E6/uL (ref 3.77–5.28)
RDW: 15 % (ref 11.7–15.4)
WBC: 13.2 10*3/uL — ABNORMAL HIGH (ref 3.4–10.8)

## 2023-01-12 LAB — COMPREHENSIVE METABOLIC PANEL
ALT: 24 [IU]/L (ref 0–32)
AST: 21 [IU]/L (ref 0–40)
Albumin: 4.4 g/dL (ref 4.0–5.0)
Alkaline Phosphatase: 254 [IU]/L — ABNORMAL HIGH (ref 44–121)
BUN/Creatinine Ratio: 23 (ref 9–23)
BUN: 19 mg/dL (ref 6–20)
Bilirubin Total: <0.2 mg/dL (ref 0.0–1.2)
CO2: 21 mmol/L (ref 20–29)
Calcium: 9.7 mg/dL (ref 8.7–10.2)
Chloride: 99 mmol/L (ref 96–106)
Creatinine, Ser: 0.84 mg/dL (ref 0.57–1.00)
Globulin, Total: 2.5 g/dL (ref 1.5–4.5)
Glucose: 422 mg/dL — ABNORMAL HIGH (ref 70–99)
Potassium: 4.7 mmol/L (ref 3.5–5.2)
Sodium: 137 mmol/L (ref 134–144)
Total Protein: 6.9 g/dL (ref 6.0–8.5)
eGFR: 96 mL/min/{1.73_m2} (ref >59)

## 2023-01-12 LAB — MICROALBUMIN / CREATININE URINE RATIO
Creatinine, Urine: 49.9 mg/dL
Microalb/Creat Ratio: 58 mg/g{creat} — ABNORMAL HIGH (ref 0–29)
Microalbumin, Urine: 29.1 ug/mL

## 2023-01-12 LAB — LIPID PANEL
Chol/HDL Ratio: 4.8 ratio — ABNORMAL HIGH (ref 0.0–4.4)
Cholesterol, Total: 148 mg/dL (ref 100–199)
HDL: 31 mg/dL — ABNORMAL LOW (ref >39)
LDL Chol Calc (NIH): 60 mg/dL (ref 0–99)
Triglycerides: 369 mg/dL — ABNORMAL HIGH (ref 0–149)
VLDL Cholesterol Cal: 57 mg/dL — ABNORMAL HIGH (ref 5–40)

## 2023-01-12 MED ORDER — INSULIN LISPRO (1 UNIT DIAL) 100 UNIT/ML (KWIKPEN): 30.0000 [IU] | PEN_INJECTOR | Freq: Three times a day (TID) | SUBCUTANEOUS | 3 refills | Status: DC

## 2023-01-12 MED ORDER — INSULIN LISPRO 100 UNIT/ML IJ SOLN: INTRAMUSCULAR | 3 refills | Status: DC

## 2023-01-12 NOTE — Telephone Encounter (Signed)
I sent in rx for Humalog.

## 2023-01-12 NOTE — Telephone Encounter (Signed)
Patient was called and made aware. She also shared that she picked up the CeQur this morning and [name] had ask that she call when she gets it, for further instruction.

## 2023-01-12 NOTE — Addendum Note (Signed)
Addended by: Dani Gobble on: 01/12/2023 04:00 PM   Modules accepted: Orders

## 2023-01-12 NOTE — Addendum Note (Signed)
Addended by: Dani Gobble on: 01/12/2023 03:30 PM   Modules accepted: Orders

## 2023-01-12 NOTE — Telephone Encounter (Signed)
She will need to sign up for the training (instructions should be on the intro kit). Once she has had that training, she will be good to go.  I sent in for insulin pens but I did not realize she was able to get the Cequr so I may need to resend in vials so it will be easier to get in the Cequr device. But her insulin instructions should be on the bottom of the guide she got at last visit as to how much she will take with meals and snacks.

## 2023-01-13 NOTE — Telephone Encounter (Signed)
Patient was called and given [name]'s recommendation.

## 2023-02-01 ENCOUNTER — Other Ambulatory Visit: Payer: Self-pay | Admitting: Family Medicine

## 2023-02-01 ENCOUNTER — Ambulatory Visit: Payer: 59 | Admitting: Family Medicine

## 2023-02-01 ENCOUNTER — Other Ambulatory Visit: Payer: Self-pay | Admitting: Nurse Practitioner

## 2023-02-01 VITALS — BP 127/86 | HR 92 | Temp 97.7°F | Ht 63.5 in | Wt 203.2 lb

## 2023-02-01 DIAGNOSIS — E1165 Type 2 diabetes mellitus with hyperglycemia: Secondary | ICD-10-CM | POA: Diagnosis not present

## 2023-02-01 DIAGNOSIS — L0291 Cutaneous abscess, unspecified: Secondary | ICD-10-CM

## 2023-02-01 DIAGNOSIS — I10 Essential (primary) hypertension: Secondary | ICD-10-CM

## 2023-02-01 DIAGNOSIS — E1169 Type 2 diabetes mellitus with other specified complication: Secondary | ICD-10-CM

## 2023-02-01 DIAGNOSIS — E782 Mixed hyperlipidemia: Secondary | ICD-10-CM | POA: Diagnosis not present

## 2023-02-01 DIAGNOSIS — E119 Type 2 diabetes mellitus without complications: Secondary | ICD-10-CM

## 2023-02-01 MED ORDER — DOXYCYCLINE HYCLATE 100 MG PO TABS: 100.0000 mg | ORAL_TABLET | Freq: Two times a day (BID) | ORAL | 0 refills | Status: DC

## 2023-02-01 NOTE — Patient Instructions (Signed)
Refills are there.  Antibiotic as prescribed.  Take care  Dr. Adriana Simas

## 2023-02-03 ENCOUNTER — Ambulatory Visit: Payer: 59 | Admitting: Family Medicine

## 2023-02-06 DIAGNOSIS — L0291 Cutaneous abscess, unspecified: Secondary | ICD-10-CM | POA: Insufficient documentation

## 2023-02-06 MED ORDER — AMLODIPINE BESYLATE 10 MG PO TABS: 10.0000 mg | ORAL_TABLET | Freq: Every day | ORAL | 3 refills | Status: DC

## 2023-02-06 MED ORDER — LISINOPRIL 40 MG PO TABS: 40.0000 mg | ORAL_TABLET | Freq: Every day | ORAL | 3 refills | Status: DC

## 2023-02-06 MED ORDER — ATORVASTATIN CALCIUM 10 MG PO TABS: ORAL_TABLET | ORAL | 3 refills | Status: DC

## 2023-02-06 NOTE — Assessment & Plan Note (Signed)
Needs better control. Suspect compliance is the main issue. Continue follow up with Endo.

## 2023-02-06 NOTE — Assessment & Plan Note (Signed)
Doxy as prescribed.

## 2023-02-06 NOTE — Progress Notes (Signed)
Subjective:  Patient ID: Melanie Mccoy, female    DOB: 1990/07/14  Age: 32 y.o. MRN: [id number]  CC: Follow-up   HPI:  31 year old female with the below mentioned medical problems presents for follow-up.  Patient working with Endo.  She was started on a device for mealtime insulin.  She has not started this yet.  She states that she has not picked it up from the pharmacy.  Diabetes remains highly uncontrolled.  Most recent A1c 11.8.  Hypertension stable on amlodipine and lisinopril.  Patient reports an abscess to the buttock.  Still troublesome but has been draining.  No fever.  Recent lipid panel revealed LDL of 60.  On Lipitor.  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   Hepatic steatosis 04/25/2022   Mixed hyperlipidemia 04/14/2021   Severe obesity (BMI 35.0-39.9) with comorbidity (HCC) 04/14/2021   Essential hypertension 09/18/2019   Uncontrolled type 2 diabetes mellitus with hyperglycemia (HCC) 01/19/2016   PCOS (polycystic ovarian syndrome) 04/15/2014    Social Hx   Social History   Socioeconomic History   Marital status: Married    Spouse name: Not on file   Number of children: Not on file   Years of education: Not on file   Highest education level: Associate degree: academic program  Occupational History   Not on file  Tobacco Use   Smoking status: Never    Passive exposure: Never   Smokeless tobacco: Never  Vaping Use   Vaping status: Never Used  Substance and Sexual Activity   Alcohol use: No    Comment: Occassionally   Drug use: No   Sexual activity: Yes  Other Topics Concern   Not on file  Social History Narrative   Not on file   Social Determinants of Health   Financial Resource Strain: Low Risk  (08/22/2022)   Overall Financial Resource Strain (CARDIA)    Difficulty of Paying Living Expenses: Not very hard  Food Insecurity: No Food Insecurity (08/22/2022)   Hunger Vital Sign     Worried About Running Out of Food in the Last Year: Never true    Ran Out of Food in the Last Year: Never true  Transportation Needs: No Transportation Needs (08/22/2022)   PRAPARE - Administrator, Civil Service (Medical): No    Lack of Transportation (Non-Medical): No  Physical Activity: Insufficiently Active (08/22/2022)   Exercise Vital Sign    Days of Exercise per Week: 1 day    Minutes of Exercise per Session: 40 min  Stress: Stress Concern Present (08/22/2022)   [employer] of Occupational Health - Occupational Stress Questionnaire    Feeling of Stress : Very much  Social Connections: Moderately Integrated (08/22/2022)   Social Connection and Isolation Panel [NHANES]    Frequency of Communication with Friends and Family: Once a week    Frequency of Social Gatherings with Friends and Family: Once a week    Attends Religious Services: More than 4 times per year    Active Member of [employer] or Organizations: Yes    Attends Banker Meetings: 1 to 4 times per year    Marital Status: Married    Review of Systems Per HPI  Objective:  BP 127/86   Pulse 92   Temp 97.7 F (36.5 C)   Ht 5' 3.5" (1.613 m)   Wt 203 lb 3.2 oz (92.2 kg)   SpO2 99%  BMI 35.43 kg/m      02/01/2023   10:49 AM 12/27/2022    3:26 PM 10/21/2022    3:35 PM  BP/Weight  Systolic BP 127 99 118  Diastolic BP 86 64 78  Wt. (Lbs) 203.2 206.4 208  BMI 35.43 kg/m2 35.99 kg/m2 36.27 kg/m2    Physical Exam Vitals and nursing note reviewed.  Constitutional:      General: She is not in acute distress.    Appearance: She is obese.  HENT:     Head: Normocephalic and atraumatic.  Cardiovascular:     Rate and Rhythm: Normal rate and regular rhythm.  Pulmonary:     Effort: Pulmonary effort is normal.     Breath sounds: Normal breath sounds. No wheezing or rales.  Skin:    Comments: Abscess noted to the buttock. No current drainage noted.   Neurological:     Mental Status:  She is alert.     Lab Results  Component Value Date   WBC 13.2 (H) 01/11/2023   HGB 11.7 01/11/2023   HCT 38.4 01/11/2023   PLT 434 01/11/2023   GLUCOSE 422 (H) 01/11/2023   CHOL 148 01/11/2023   TRIG 369 (H) 01/11/2023   HDL 31 (L) 01/11/2023   LDLCALC 60 01/11/2023   ALT 24 01/11/2023   AST 21 01/11/2023   NA 137 01/11/2023   K 4.7 01/11/2023   CL 99 01/11/2023   CREATININE 0.84 01/11/2023   BUN 19 01/11/2023   CO2 21 01/11/2023   TSH 0.863 04/18/2022   INR 1.0 05/04/2022   HGBA1C 11.8 (A) 12/27/2022   MICROALBUR 80 01/01/2021     Assessment & Plan:   Problem List Items Addressed This Visit       Cardiovascular and Mediastinum   Essential hypertension - Primary (Chronic)    Stable. Medications refilled. Continue.      Relevant Medications   amLODipine (NORVASC) 10 MG tablet   atorvastatin (LIPITOR) 10 MG tablet   lisinopril (ZESTRIL) 40 MG tablet     Endocrine   Uncontrolled type 2 diabetes mellitus with hyperglycemia (HCC) (Chronic)    Needs better control. Suspect compliance is the main issue. Continue follow up with Endo.      Relevant Medications   atorvastatin (LIPITOR) 10 MG tablet   lisinopril (ZESTRIL) 40 MG tablet     Other   Mixed hyperlipidemia (Chronic)    LDL @ Goal. Needs to work on Diabetes control. Continue statin.      Relevant Medications   amLODipine (NORVASC) 10 MG tablet   atorvastatin (LIPITOR) 10 MG tablet   lisinopril (ZESTRIL) 40 MG tablet   Abscess    Doxy as prescribed.        Meds ordered this encounter  Medications   doxycycline (VIBRA-TABS) 100 MG tablet    Sig: Take 1 tablet (100 mg total) by mouth 2 (two) times daily.    Dispense:  20 tablet    Refill:  0   amLODipine (NORVASC) 10 MG tablet    Sig: Take 1 tablet (10 mg total) by mouth daily.    Dispense:  90 tablet    Refill:  3   atorvastatin (LIPITOR) 10 MG tablet    Sig: TAKE 1 TABLET(10 MG) BY MOUTH DAILY    Dispense:  90 tablet    Refill:  3    lisinopril (ZESTRIL) 40 MG tablet    Sig: Take 1 tablet (40 mg total) by mouth daily.    Dispense:  90 tablet    Refill:  3    Follow-up:  Return in about 6 months (around 08/01/2023).  Everlene Other DO [facility]

## 2023-02-06 NOTE — Assessment & Plan Note (Signed)
Stable. Medications refilled. Continue.

## 2023-02-06 NOTE — Assessment & Plan Note (Signed)
LDL @ Goal. Needs to work on Diabetes control. Continue statin.

## 2023-02-16 ENCOUNTER — Telehealth: Payer: Self-pay | Admitting: *Deleted

## 2023-02-16 NOTE — Telephone Encounter (Signed)
Copied from CRM [id number]. Topic: Clinical - Medical Advice >> Feb 16, 2023  9:37 AM Dennison Nancy wrote: Reason for CRM: patient have question she is schedule for an appointment with Toni Amend Grooms on 02/17/23  reason she has an abscess that has gotten bigger and want to know if the provider can help with lancing the abscess on her buttock was seen with jayce cook regarding the same thing on 02/01/23 it has gotten bigger

## 2023-02-17 ENCOUNTER — Ambulatory Visit: Payer: 59 | Admitting: Physician Assistant

## 2023-02-17 ENCOUNTER — Ambulatory Visit: Payer: 59 | Admitting: Family Medicine

## 2023-02-17 VITALS — BP 128/82 | Ht 63.5 in | Wt 201.6 lb

## 2023-02-17 DIAGNOSIS — L0291 Cutaneous abscess, unspecified: Secondary | ICD-10-CM | POA: Diagnosis not present

## 2023-02-17 MED ORDER — DOXYCYCLINE HYCLATE 100 MG PO TABS: 100.0000 mg | ORAL_TABLET | Freq: Two times a day (BID) | ORAL | 0 refills | Status: DC

## 2023-02-17 NOTE — Assessment & Plan Note (Signed)
Incision and drainage performed today.  Patient will pull packing at home.  Antibiotics as directed.

## 2023-02-17 NOTE — Telephone Encounter (Signed)
Patient seen in office 02/17/23

## 2023-02-17 NOTE — Patient Instructions (Signed)
Antibiotic as prescribed.  Pull packing in ~36-48 hours.  Call with concerns.

## 2023-02-17 NOTE — Progress Notes (Signed)
Subjective:  Patient ID: Melanie Mccoy, female    DOB: 11/20/1990  Age: 32 y.o. MRN: [id number]  CC:   Chief Complaint  Patient presents with   abcess on bottom    HPI:  32 year old female presents for evaluation of the above.  Right buttock abscess.  Improved with doxycycline but has worsened.  Patient feels that she needs incision and drainage.  No fever.  No other symptoms.   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   Hepatic steatosis 04/25/2022   Mixed hyperlipidemia 04/14/2021   Severe obesity (BMI 35.0-39.9) with comorbidity (HCC) 04/14/2021   Essential hypertension 09/18/2019   Uncontrolled type 2 diabetes mellitus with hyperglycemia (HCC) 01/19/2016   PCOS (polycystic ovarian syndrome) 04/15/2014    Social Hx   Social History   Socioeconomic History   Marital status: Married    Spouse name: Not on file   Number of children: Not on file   Years of education: Not on file   Highest education level: Associate degree: academic program  Occupational History   Not on file  Tobacco Use   Smoking status: Never    Passive exposure: Never   Smokeless tobacco: Never  Vaping Use   Vaping status: Never Used  Substance and Sexual Activity   Alcohol use: No    Comment: Occassionally   Drug use: No   Sexual activity: Yes  Other Topics Concern   Not on file  Social History Narrative   Not on file   Social Drivers of Health   Financial Resource Strain: Low Risk  (02/16/2023)   Overall Financial Resource Strain (CARDIA)    Difficulty of Paying Living Expenses: Not very hard  Food Insecurity: No Food Insecurity (02/16/2023)   Hunger Vital Sign    Worried About Running Out of Food in the Last Year: Never true    Ran Out of Food in the Last Year: Never true  Transportation Needs: No Transportation Needs (02/16/2023)   PRAPARE - Administrator, Civil Service (Medical): No    Lack of  Transportation (Non-Medical): No  Physical Activity: Inactive (02/16/2023)   Exercise Vital Sign    Days of Exercise per Week: 0 days    Minutes of Exercise per Session: 40 min  Stress: Stress Concern Present (02/16/2023)   [employer] of Occupational Health - Occupational Stress Questionnaire    Feeling of Stress : Very much  Social Connections: Socially Integrated (02/16/2023)   Social Connection and Isolation Panel [NHANES]    Frequency of Communication with Friends and Family: Three times a week    Frequency of Social Gatherings with Friends and Family: Never    Attends Religious Services: More than 4 times per year    Active Member of Clubs or Organizations: No    Attends Engineer, structural: 1 to 4 times per year    Marital Status: Married    Review of Systems Per HPI  Objective:  BP 128/82   Ht 5' 3.5" (1.613 m)   Wt 201 lb 9.6 oz (91.4 kg)   BMI 35.15 kg/m      02/17/2023    8:30 AM 02/01/2023   10:49 AM 12/27/2022    3:26 PM  BP/Weight  Systolic BP 128 127 99  Diastolic BP 82 86 64  Wt. (Lbs) 201.6 203.2 206.4  BMI 35.15 kg/m2 35.43 kg/m2 35.99 kg/m2    Physical Exam  Vitals and nursing note reviewed. Exam conducted with a chaperone present.  Constitutional:      General: She is not in acute distress.    Appearance: She is obese.  Skin:    Comments: Right buttock with a fairly large fluctuant area with erythema.  Neurological:     Mental Status: She is alert.     Lab Results  Component Value Date   WBC 13.2 (H) 01/11/2023   HGB 11.7 01/11/2023   HCT 38.4 01/11/2023   PLT 434 01/11/2023   GLUCOSE 422 (H) 01/11/2023   CHOL 148 01/11/2023   TRIG 369 (H) 01/11/2023   HDL 31 (L) 01/11/2023   LDLCALC 60 01/11/2023   ALT 24 01/11/2023   AST 21 01/11/2023   NA 137 01/11/2023   K 4.7 01/11/2023   CL 99 01/11/2023   CREATININE 0.84 01/11/2023   BUN 19 01/11/2023   CO2 21 01/11/2023   TSH 0.863 04/18/2022   INR 1.0 05/04/2022    HGBA1C 11.8 (A) 12/27/2022   MICROALBUR 80 01/01/2021   I & D  Date/Time: 02/17/2023 9:33 AM  Performed by: Tommie Sams, DO Authorized by: Tommie Sams, DO   Consent:    Consent obtained:  Verbal   Consent given by:  Patient Location:    Type:  Abscess   Location:  Lower extremity   Lower extremity location:  Buttock   Buttock location:  R buttock Pre-procedure details:    Skin preparation:  Alcohol (Betadaine not available at this time.) Sedation:    Sedation type:  None Anesthesia:    Anesthesia method:  Local infiltration   Local anesthetic:  Lidocaine 1% WITH epi Procedure type:    Complexity:  Simple Procedure details:    Incision types:  Stab incision   Scalpel blade:  11   Drainage:  Purulent   Drainage amount:  Copious   Wound treatment:  Wound left open   Packing material: Plain packing. Post-procedure details:    Procedure completion:  Tolerated well, no immediate complications    Assessment & Plan:   Problem List Items Addressed This Visit       Other   Abscess - Primary   Incision and drainage performed today.  Patient will pull packing at home.  Antibiotics as directed.       Meds ordered this encounter  Medications   doxycycline (VIBRA-TABS) 100 MG tablet    Sig: Take 1 tablet (100 mg total) by mouth 2 (two) times daily.    Dispense:  14 tablet    Refill:  0    Follow-up:  Return if symptoms worsen or fail to improve.  Everlene Other DO [facility]

## 2023-03-10 ENCOUNTER — Encounter: Payer: Self-pay | Admitting: Family Medicine

## 2023-03-29 ENCOUNTER — Ambulatory Visit: Payer: 59 | Admitting: Nurse Practitioner

## 2023-04-03 ENCOUNTER — Other Ambulatory Visit: Payer: Self-pay | Admitting: Family Medicine

## 2023-04-03 DIAGNOSIS — I10 Essential (primary) hypertension: Secondary | ICD-10-CM

## 2023-04-18 ENCOUNTER — Ambulatory Visit: Payer: Self-pay | Admitting: Family Medicine

## 2023-04-18 NOTE — Telephone Encounter (Signed)
Copied from CRM [id number]. Topic: Clinical - Red Word Triage >> Apr 18, 2023 10:40 AM Fuller Mandril wrote: Red Word that prompted transfer to Nurse Triage: Sugar reading 53. Monitor states critical. Has tried things to bring it up.    Chief Complaint: Low blood sugar Symptoms: headache, getting better Frequency: intermittent Pertinent Negatives: Patient denies nausea, dizziness Disposition: [] ED /[] Urgent Care (no appt availability in office) / [] Appointment(In office/virtual)/ []  [facility]/ [] Home Care/ [] Refused Recommended Disposition /[] [facility]/ [x]  Follow-up with PCP Additional Notes: Pt reports low BG readings through the night. States that she woke up with a headache this morning and noticed her BG was 53. States that it fluctuated this morning from 50-70. Pt sts that she drank some chocolate milk, had 2 glucose tabs, and some candy. Headache now improving and BG is 190-200. Pt sts that her normal range is 200-250. Pt also reports that her Josephine Igo sensor is saying "glucose reading pased, alarm unavailable" so there is concern whether the readings are accurate. Pt unable to check BG manually. Will route to clinic [facility] for further advisory.    Reason for Disposition  [1] Caller has URGENT medication or insulin pump question AND [2] triager unable to answer question  Answer Assessment - Initial Assessment Questions 1. SYMPTOMS: "What symptoms are you concerned about?"     Low blood sugar, headache,   2. ONSET:  "When did the symptoms start?"     Started having symptoms 0830 this morning, monitor is showing low BG levels since 3am  3. BLOOD GLUCOSE: "What is your blood glucose level?"      53-59  4. USUAL RANGE: "What is your blood glucose level usually?" (e.g., usual fasting morning value, usual evening value)     200-250  5. TYPE 1 or 2:  "Do you know what type of diabetes you have?"  (e.g., Type 1, Type 2, Gestational; doesn't know)      Type 2  6. INSULIN:  "Do you take insulin?" "What type of insulin(s) do you use? What is the mode of delivery? (syringe, pen; injection or pump) "When did you last give yourself an insulin dose?" (i.e., time or hours/minutes ago) "How much did you give?" (i.e., how many units)     Toujeo at midnight; Novolog- 3 days ago  7. DIABETES PILLS: "Do you take any pills for your diabetes?" If Yes, ask: "What is the name of the medicine(s) that you take for high blood sugar?"     Metformin  8. OTHER SYMPTOMS: "Do you have any symptoms?" (e.g., fever, frequent urination, difficulty breathing, vomiting)     Feels "feveris" but not running fevers  9. LOW BLOOD GLUCOSE TREATMENT: "What have you done so far to treat the low blood glucose level?"     Half cup choc milk, 2 glucose tabs, starburst  10. FOOD: "When did you last eat or drink?"       Within the last 30 min  11. ALONE: "Are you alone right now or is someone with you?"        Someone is with her  12. PREGNANCY: "Is there any chance you are pregnant?" "When was your last menstrual period?"       No, Currently on cycle  Protocols used: Diabetes - Low Blood Sugar-A-AH

## 2023-04-18 NOTE — Telephone Encounter (Signed)
Please advise

## 2023-04-20 NOTE — Telephone Encounter (Signed)
Melanie Sams, DO     Patient needs to have glucometer/strips/lancets on hand to verify low sugar readings. Recommend reaching out to her endocrinologist.

## 2023-04-24 ENCOUNTER — Ambulatory Visit: Payer: 59 | Admitting: Family Medicine

## 2023-04-24 ENCOUNTER — Encounter: Payer: Self-pay | Admitting: Family Medicine

## 2023-04-24 VITALS — BP 111/77 | HR 101 | Temp 98.8°F | Ht 63.5 in | Wt 198.0 lb

## 2023-04-24 DIAGNOSIS — J111 Influenza due to unidentified influenza virus with other respiratory manifestations: Secondary | ICD-10-CM | POA: Diagnosis not present

## 2023-04-24 MED ORDER — OSELTAMIVIR PHOSPHATE 75 MG PO CAPS: 75.0000 mg | ORAL_CAPSULE | Freq: Two times a day (BID) | ORAL | 0 refills | Status: DC

## 2023-04-24 MED ORDER — PROMETHAZINE-DM 6.25-15 MG/5ML PO SYRP: 5.0000 mL | ORAL_SOLUTION | Freq: Four times a day (QID) | ORAL | 0 refills | Status: DC | PRN

## 2023-04-24 NOTE — Progress Notes (Signed)
 Subjective:  Patient ID: Melanie Mccoy, female    DOB: [birth year]  Age: 33 y.o. MRN: [id number]  CC:   Chief Complaint  Patient presents with   Nasal Congestion    Chills, body aches. Coughing from nasal drainage. X's 2 days    HPI:  33 year old female presents for evaluation of the above.  Symptoms started yesterday.  Patient reports that she has had bodyaches, chills, low-grade temp Tmax nine 9.6, sore throat, drainage, chest congestion, and nasal congestion.  Patient believes that she has influenza.  No relieving factors.  She is most bothered by body aches.  No known exacerbating factors.  No other complaints.  Needs work note.  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   Hepatic steatosis 04/25/2022   Mixed hyperlipidemia 04/14/2021   Severe obesity (BMI 35.0-39.9) with comorbidity (HCC) 04/14/2021   Essential hypertension 09/18/2019   Uncontrolled type 2 diabetes mellitus with hyperglycemia (HCC) 01/19/2016   PCOS (polycystic ovarian syndrome) 04/15/2014    Social Hx   Social History   Socioeconomic History   Marital status: Married    Spouse name: Not on file   Number of children: Not on file   Years of education: Not on file   Highest education level: Associate degree: academic program  Occupational History   Not on file  Tobacco Use   Smoking status: Never    Passive exposure: Never   Smokeless tobacco: Never  Vaping Use   Vaping status: Never Used  Substance and Sexual Activity   Alcohol use: No    Comment: Occassionally   Drug use: No   Sexual activity: Yes  Other Topics Concern   Not on file  Social History Narrative   Not on file   Social Drivers of Health   Financial Resource Strain: Low Risk  (02/16/2023)   Overall Financial Resource Strain (CARDIA)    Difficulty of Paying Living Expenses: Not very hard  Food Insecurity: No Food Insecurity (02/16/2023)    Hunger Vital Sign    Worried About Running Out of Food in the Last Year: Never true    Ran Out of Food in the Last Year: Never true  Transportation Needs: No Transportation Needs (02/16/2023)   PRAPARE - Administrator, Civil Service (Medical): No    Lack of Transportation (Non-Medical): No  Physical Activity: Inactive (02/16/2023)   Exercise Vital Sign    Days of Exercise per Week: 0 days    Minutes of Exercise per Session: 40 min  Stress: Stress Concern Present (02/16/2023)   [employer] of Occupational Health - Occupational Stress Questionnaire    Feeling of Stress : Very much  Social Connections: Socially Integrated (02/16/2023)   Social Connection and Isolation Panel [NHANES]    Frequency of Communication with Friends and Family: Three times a week    Frequency of Social Gatherings with Friends and Family: Never    Attends Religious Services: More than 4 times per year    Active Member of Clubs or Organizations: No    Attends Banker Meetings: 1 to 4 times per year    Marital Status: Married    Review of Systems Per HPI  Objective:  BP 111/77   Pulse (!) 101   Temp 98.8 F (37.1 C)   Ht 5' 3.5" (1.613 m)   Wt 198 lb (89.8 kg)   SpO2 97%  BMI 34.52 kg/m      04/24/2023    9:22 AM 02/17/2023    8:30 AM 02/01/2023   10:49 AM  BP/Weight  Systolic BP 111 128 127  Diastolic BP 77 82 86  Wt. (Lbs) 198 201.6 203.2  BMI 34.52 kg/m2 35.15 kg/m2 35.43 kg/m2    Physical Exam Vitals and nursing note reviewed.  Constitutional:      General: She is not in acute distress.    Appearance: Normal appearance.  HENT:     Head: Normocephalic and atraumatic.     Nose: Congestion present.     Mouth/Throat:     Pharynx: Oropharynx is clear.  Eyes:     General:        Right eye: No discharge.        Left eye: No discharge.     Conjunctiva/sclera: Conjunctivae normal.  Cardiovascular:     Rate and Rhythm: Normal rate and regular rhythm.   Pulmonary:     Effort: Pulmonary effort is normal.     Breath sounds: Normal breath sounds. No wheezing, rhonchi or rales.  Neurological:     Mental Status: She is alert.  Psychiatric:        Mood and Affect: Mood normal.        Behavior: Behavior normal.     Lab Results  Component Value Date   WBC 13.2 (H) 01/11/2023   HGB 11.7 01/11/2023   HCT 38.4 01/11/2023   PLT 434 01/11/2023   GLUCOSE 422 (H) 01/11/2023   CHOL 148 01/11/2023   TRIG 369 (H) 01/11/2023   HDL 31 (L) 01/11/2023   LDLCALC 60 01/11/2023   ALT 24 01/11/2023   AST 21 01/11/2023   NA 137 01/11/2023   K 4.7 01/11/2023   CL 99 01/11/2023   CREATININE 0.84 01/11/2023   BUN 19 01/11/2023   CO2 21 01/11/2023   TSH 0.863 04/18/2022   INR 1.0 05/04/2022   HGBA1C 11.8 (A) 12/27/2022   MICROALBUR 80 01/01/2021     Assessment & Plan:   Problem List Items Addressed This Visit       Respiratory   Influenza - Primary   Suspected influenza.  Treating with Tamiflu.  Promethazine DM as well.  Work note given.      Relevant Medications   oseltamivir (TAMIFLU) 75 MG capsule    Meds ordered this encounter  Medications   oseltamivir (TAMIFLU) 75 MG capsule    Sig: Take 1 capsule (75 mg total) by mouth 2 (two) times daily.    Dispense:  10 capsule    Refill:  0   promethazine-dextromethorphan (PROMETHAZINE-DM) 6.25-15 MG/5ML syrup    Sig: Take 5 mLs by mouth 4 (four) times daily as needed (Cough, congestion).    Dispense:  118 mL    Refill:  0    Follow-up:  Return if symptoms worsen or fail to improve.  Everlene Other DO [facility]

## 2023-04-24 NOTE — Assessment & Plan Note (Signed)
 Suspected influenza.  Treating with Tamiflu.  Promethazine DM as well.  Work note given.

## 2023-04-24 NOTE — Patient Instructions (Signed)
 Rest.  Fluids.  Medications as directed.  Call/message with concerns.

## 2023-05-24 ENCOUNTER — Telehealth: Payer: Self-pay

## 2023-05-24 NOTE — Telephone Encounter (Signed)
 Please advise. Pt would like labs ordered before next visit.

## 2023-05-29 ENCOUNTER — Other Ambulatory Visit: Payer: Self-pay

## 2023-05-29 DIAGNOSIS — E782 Mixed hyperlipidemia: Secondary | ICD-10-CM

## 2023-05-29 DIAGNOSIS — I10 Essential (primary) hypertension: Secondary | ICD-10-CM

## 2023-05-29 DIAGNOSIS — E1165 Type 2 diabetes mellitus with hyperglycemia: Secondary | ICD-10-CM

## 2023-06-05 LAB — HEMOGLOBIN A1C: Hemoglobin A1C: 12.7

## 2023-06-06 LAB — COMPREHENSIVE METABOLIC PANEL WITH GFR
ALT: 25 IU/L (ref 0–32)
AST: 19 IU/L (ref 0–40)
Albumin: 4.3 g/dL (ref 3.9–4.9)
Alkaline Phosphatase: 294 IU/L — ABNORMAL HIGH (ref 44–121)
BUN/Creatinine Ratio: 19 (ref 9–23)
BUN: 14 mg/dL (ref 6–20)
Bilirubin Total: <0.2 mg/dL (ref 0.0–1.2)
CO2: 21 mmol/L (ref 20–29)
Calcium: 9.8 mg/dL (ref 8.7–10.2)
Chloride: 100 mmol/L (ref 96–106)
Creatinine, Ser: 0.74 mg/dL (ref 0.57–1.00)
Globulin, Total: 2.4 g/dL (ref 1.5–4.5)
Glucose: 212 mg/dL — ABNORMAL HIGH (ref 70–99)
Potassium: 4.2 mmol/L (ref 3.5–5.2)
Sodium: 137 mmol/L (ref 134–144)
Total Protein: 6.7 g/dL (ref 6.0–8.5)
eGFR: 111 mL/min/{1.73_m2} (ref >59)

## 2023-06-06 LAB — CBC
Hematocrit: 38.4 % (ref 34.0–46.6)
Hemoglobin: 11.8 g/dL (ref 11.1–15.9)
MCH: 24.3 pg — ABNORMAL LOW (ref 26.6–33.0)
MCHC: 30.7 g/dL — ABNORMAL LOW (ref 31.5–35.7)
MCV: 79 fL (ref 79–97)
Platelets: 482 10*3/uL — ABNORMAL HIGH (ref 150–450)
RBC: 4.85 x10E6/uL (ref 3.77–5.28)
RDW: 16.5 % — ABNORMAL HIGH (ref 11.7–15.4)
WBC: 12.3 10*3/uL — ABNORMAL HIGH (ref 3.4–10.8)

## 2023-06-06 LAB — HEMOGLOBIN A1C
Est. average glucose Bld gHb Est-mCnc: 318 mg/dL
Hgb A1c MFr Bld: 12.7 % — ABNORMAL HIGH (ref 4.8–5.6)

## 2023-06-06 LAB — MICROALBUMIN / CREATININE URINE RATIO
Creatinine, Urine: 71.8 mg/dL
Microalb/Creat Ratio: 91 mg/g{creat} — ABNORMAL HIGH (ref 0–29)
Microalbumin, Urine: 65.1 ug/mL

## 2023-06-06 LAB — LIPID PANEL
Chol/HDL Ratio: 4.4 ratio (ref 0.0–4.4)
Cholesterol, Total: 144 mg/dL (ref 100–199)
HDL: 33 mg/dL — ABNORMAL LOW (ref >39)
LDL Chol Calc (NIH): 79 mg/dL (ref 0–99)
Triglycerides: 188 mg/dL — ABNORMAL HIGH (ref 0–149)
VLDL Cholesterol Cal: 32 mg/dL (ref 5–40)

## 2023-06-07 ENCOUNTER — Ambulatory Visit: Payer: 59 | Admitting: Nurse Practitioner

## 2023-06-07 ENCOUNTER — Ambulatory Visit: Admitting: Family Medicine

## 2023-06-07 ENCOUNTER — Encounter: Payer: Self-pay | Admitting: Family Medicine

## 2023-06-07 ENCOUNTER — Encounter: Payer: Self-pay | Admitting: Nurse Practitioner

## 2023-06-07 VITALS — BP 120/74 | HR 88 | Ht 63.5 in | Wt 200.4 lb

## 2023-06-07 DIAGNOSIS — I10 Essential (primary) hypertension: Secondary | ICD-10-CM | POA: Diagnosis not present

## 2023-06-07 DIAGNOSIS — E1165 Type 2 diabetes mellitus with hyperglycemia: Secondary | ICD-10-CM

## 2023-06-07 DIAGNOSIS — E782 Mixed hyperlipidemia: Secondary | ICD-10-CM

## 2023-06-07 DIAGNOSIS — E119 Type 2 diabetes mellitus without complications: Secondary | ICD-10-CM | POA: Diagnosis not present

## 2023-06-07 DIAGNOSIS — Z794 Long term (current) use of insulin: Secondary | ICD-10-CM | POA: Diagnosis not present

## 2023-06-07 DIAGNOSIS — E1169 Type 2 diabetes mellitus with other specified complication: Secondary | ICD-10-CM | POA: Diagnosis not present

## 2023-06-07 DIAGNOSIS — Z7984 Long term (current) use of oral hypoglycemic drugs: Secondary | ICD-10-CM

## 2023-06-07 DIAGNOSIS — Z7985 Long-term (current) use of injectable non-insulin antidiabetic drugs: Secondary | ICD-10-CM

## 2023-06-07 DIAGNOSIS — E785 Hyperlipidemia, unspecified: Secondary | ICD-10-CM

## 2023-06-07 MED ORDER — TOUJEO MAX SOLOSTAR 300 UNIT/ML ~~LOC~~ SOPN: 120.0000 [IU] | PEN_INJECTOR | Freq: Every day | SUBCUTANEOUS | 3 refills | Status: DC

## 2023-06-07 MED ORDER — METFORMIN HCL ER 500 MG PO TB24: 1000.0000 mg | ORAL_TABLET | Freq: Every day | ORAL | 3 refills | Status: DC

## 2023-06-07 MED ORDER — LISINOPRIL 40 MG PO TABS: 40.0000 mg | ORAL_TABLET | Freq: Every day | ORAL | 3 refills | Status: AC

## 2023-06-07 MED ORDER — ATORVASTATIN CALCIUM 10 MG PO TABS
ORAL_TABLET | ORAL | 3 refills | Status: DC
Start: 2023-06-07 — End: 2023-11-08

## 2023-06-07 MED ORDER — SEMAGLUTIDE (2 MG/DOSE) 8 MG/3ML ~~LOC~~ SOPN: 2.0000 mg | PEN_INJECTOR | SUBCUTANEOUS | 3 refills | Status: DC

## 2023-06-07 MED ORDER — FREESTYLE LIBRE 3 PLUS SENSOR MISC: 3 refills | Status: DC

## 2023-06-07 MED ORDER — AMLODIPINE BESYLATE 10 MG PO TABS: 10.0000 mg | ORAL_TABLET | Freq: Every day | ORAL | 3 refills | Status: AC

## 2023-06-07 NOTE — Progress Notes (Signed)
 Endocrinology Follow Up Visit       06/07/2023, 9:14 AM   Subjective:    Patient ID: Melanie Mccoy, female    DOB: 19-Oct-1990.  Melanie Mccoy is being seen in follow up after being seen in consultation for management of currently uncontrolled symptomatic diabetes requested by  Tommie Sams, DO.   Past Medical History:  Diagnosis Date   Anemia    slightly anemic, iron supplements every 2-3 days   Bartholin cyst    Chlamydia 01/2011   Diabetes mellitus without complication (HCC)    Family history of anesthesia complication    grandmother had spinal with nerve damage   Hepatic adenoma    No pertinent past medical history     Past Surgical History:  Procedure Laterality Date   CESAREAN SECTION N/A 11/22/2012   Procedure: PRIMARY CESAREAN SECTION;  Surgeon: Esmeralda Arthur, MD;  Location: [facility];  Service: Obstetrics;  Laterality: N/A;   CYST REMOVAL TRUNK  2005    Social History   Socioeconomic History   Marital status: Married    Spouse name: Not on file   Number of children: Not on file   Years of education: Not on file   Highest education level: Associate degree: academic program  Occupational History   Not on file  Tobacco Use   Smoking status: Never    Passive exposure: Never   Smokeless tobacco: Never  Vaping Use   Vaping status: Never Used  Substance and Sexual Activity   Alcohol use: No    Comment: Occassionally   Drug use: No   Sexual activity: Yes  Other Topics Concern   Not on file  Social History Narrative   Not on file   Social Drivers of Health   Financial Resource Strain: Low Risk  (02/16/2023)   Overall Financial Resource Strain (CARDIA)    Difficulty of Paying Living Expenses: Not very hard  Food Insecurity: No Food Insecurity (02/16/2023)   Hunger Vital Sign    Worried About Running Out of Food in the Last Year: Never true    Ran Out of Food in the Last Year:  Never true  Transportation Needs: No Transportation Needs (02/16/2023)   PRAPARE - Administrator, Civil Service (Medical): No    Lack of Transportation (Non-Medical): No  Physical Activity: Inactive (02/16/2023)   Exercise Vital Sign    Days of Exercise per Week: 0 days    Minutes of Exercise per Session: 40 min  Stress: Stress Concern Present (02/16/2023)   [employer] of Occupational Health - Occupational Stress Questionnaire    Feeling of Stress : Very much  Social Connections: Socially Integrated (02/16/2023)   Social Connection and Isolation Panel [NHANES]    Frequency of Communication with Friends and Family: Three times a week    Frequency of Social Gatherings with Friends and Family: Never    Attends Religious Services: More than 4 times per year    Active Member of Clubs or Organizations: No    Attends Engineer, structural: 1 to 4 times per year    Marital Status: Married    Family History  Problem Relation Age of  Onset   Cancer Other    Heart disease Other    Diabetes Other    Diabetes Mother    Diabetes Father    Diabetes Maternal Grandfather     Outpatient Encounter Medications as of 06/07/2023  Medication Sig   amLODipine (NORVASC) 10 MG tablet Take 1 tablet (10 mg total) by mouth daily.   atorvastatin (LIPITOR) 10 MG tablet TAKE 1 TABLET(10 MG) BY MOUTH DAILY   Continuous Glucose Sensor (FREESTYLE LIBRE 3 PLUS SENSOR) MISC Change sensor every 15 days.   Insulin Aspart (NOVOLOG [place]) Inject 43-48 Units into the skin 3 (three) times daily.   Insulin Pen Needle (PEN NEEDLES) 31G X 8 MM MISC Use to inject insulin 4 times daily   lisinopril (ZESTRIL) 40 MG tablet TAKE 1 TABLET(40 MG) BY MOUTH DAILY   [DISCONTINUED] Continuous Glucose Sensor (FREESTYLE LIBRE 3 SENSOR) MISC Use to check glucose continuously   [DISCONTINUED] insulin glargine, 2 Unit Dial, (TOUJEO MAX SOLOSTAR) 300 UNIT/ML Solostar Pen Inject 120 Units into the skin daily.    [DISCONTINUED] metFORMIN (GLUCOPHAGE-XR) 500 MG 24 hr tablet Take 2 tablets (1,000 mg total) by mouth daily with supper.   [DISCONTINUED] Semaglutide, 2 MG/DOSE, 8 MG/3ML SOPN Inject 2 mg as directed once a week.   injection device for insulin (CEQUR SIMPLICITY 2U) DEVI Change every 2-3 days as directed. (Patient not taking: Reported on 06/07/2023)   Injection Device for Insulin (CEQUR SIMPLICITY INSERTER) MISC Use to change device every 2-3 days as directed (Patient not taking: Reported on 06/07/2023)   insulin glargine, 2 Unit Dial, (TOUJEO MAX SOLOSTAR) 300 UNIT/ML Solostar Pen Inject 120 Units into the skin daily.   insulin lispro (HUMALOG) 100 UNIT/ML injection Use with Cequr device for TDD around 45 units daily. (Patient not taking: Reported on 06/07/2023)   metFORMIN (GLUCOPHAGE-XR) 500 MG 24 hr tablet Take 2 tablets (1,000 mg total) by mouth daily with supper.   Semaglutide, 2 MG/DOSE, 8 MG/3ML SOPN Inject 2 mg as directed once a week.   [DISCONTINUED] oseltamivir (TAMIFLU) 75 MG capsule Take 1 capsule (75 mg total) by mouth 2 (two) times daily. (Patient not taking: Reported on 06/07/2023)   [DISCONTINUED] promethazine-dextromethorphan (PROMETHAZINE-DM) 6.25-15 MG/5ML syrup Take 5 mLs by mouth 4 (four) times daily as needed (Cough, congestion). (Patient not taking: Reported on 06/07/2023)   No facility-administered encounter medications on file as of 06/07/2023.    ALLERGIES: Allergies  Allergen Reactions   Bee Venom Swelling    Progressively worse with each sting Other reaction(s): Other   Nickel Rash and Itching   Ibuprofen Hives    VACCINATION STATUS: Immunization History  Administered Date(s) Administered   Tdap 06/26/2006    Diabetes She presents for her follow-up diabetic visit. She has type 2 diabetes mellitus. Onset time: She was diagnosed at approx age of 8. Her disease course has been worsening. There are no hypoglycemic associated symptoms. Associated symptoms include fatigue.  Pertinent negatives for diabetes include no blurred vision, no polydipsia, no polyphagia and no polyuria. There are no hypoglycemic complications. Symptoms are stable. There are no diabetic complications. Risk factors for coronary artery disease include diabetes mellitus, dyslipidemia, hypertension, obesity, sedentary lifestyle, family history and stress. Current diabetic treatment includes oral agent (dual therapy) and intensive insulin program (and Ozempic). She is compliant with treatment some of the time (had a period of time where was out of short acting insulin). Her weight is stable. She is following a generally unhealthy diet. When asked about meal planning, she  reported none. She has had a previous visit with a dietitian. She participates in exercise intermittently. Her home blood glucose trend is increasing steadily. Her breakfast blood glucose range is generally >200 mg/dl. Her lunch blood glucose range is generally >200 mg/dl. Her dinner blood glucose range is generally >200 mg/dl. Her bedtime blood glucose range is generally >200 mg/dl. Her overall blood glucose range is >200 mg/dl. (She presents today with her CGM showing gross hyperglycemia overall.  Her most recent A1c on 3/31 by her PCP was 12.7%.  She has not been using the Cequr device as prescribed at last visit, has never had the training for it.  In the interim, she has been taking Humalog 43-48 units TID when she remembers.  Analysis of her CGM shows TIR 4%, TAR 96% (81% in level 2 hyperglycemia range), TBR 0% with a GMI of 10.9%.) An ACE inhibitor/angiotensin II receptor blocker is being taken. She does not see a podiatrist.Eye exam is current.  Hypertension This is a chronic problem. The current episode started more than 1 year ago. The problem has been gradually improving since onset. The problem is uncontrolled. Pertinent negatives include no blurred vision. There are no associated agents to hypertension. Risk factors for coronary artery  disease include diabetes mellitus, dyslipidemia, obesity, sedentary lifestyle and stress. Past treatments include calcium channel blockers and ACE inhibitors. The current treatment provides mild improvement. Compliance problems include exercise and diet.   Hyperlipidemia This is a chronic problem. The current episode started more than 1 year ago. The problem is uncontrolled. Recent lipid tests were reviewed and are high. Exacerbating diseases include diabetes and obesity. Factors aggravating her hyperlipidemia include fatty foods. Current antihyperlipidemic treatment includes statins. The current treatment provides mild improvement of lipids. Compliance problems include adherence to diet and adherence to exercise.  Risk factors for coronary artery disease include diabetes mellitus, dyslipidemia, hypertension, obesity, a sedentary lifestyle and stress.    Review of systems  Constitutional: + Minimally fluctuating body weight,  current Body mass index is 34.94 kg/m. , no fatigue, no subjective hyperthermia, no subjective hypothermia Eyes: no blurry vision, no xerophthalmia ENT: no sore throat, no nodules palpated in throat, no dysphagia/odynophagia, no hoarseness Cardiovascular: no chest pain, no shortness of breath, no palpitations, no leg swelling Respiratory: no cough, no shortness of breath Gastrointestinal: no nausea/vomiting/diarrhea Musculoskeletal: no muscle/joint aches Skin: no rashes, no hyperemia Neurological: no tremors, no numbness, no tingling, no dizziness Psychiatric: no depression, no anxiety  Objective:     BP 120/74 (BP Location: Left Arm, Patient Position: Sitting, Cuff Size: Large)   Pulse 88   Ht 5' 3.5" (1.613 m)   Wt 200 lb 6.4 oz (90.9 kg)   BMI 34.94 kg/m   Wt Readings from Last 3 Encounters:  06/07/23 200 lb 6.4 oz (90.9 kg)  04/24/23 198 lb (89.8 kg)  02/17/23 201 lb 9.6 oz (91.4 kg)    BP Readings from Last 3 Encounters:  06/07/23 120/74  04/24/23 111/77   02/17/23 128/82     Physical Exam- Limited  Constitutional:  Body mass index is 34.94 kg/m. , not in acute distress, normal state of mind Eyes:  EOMI, no exophthalmos Musculoskeletal: no gross deformities, strength intact in all four extremities, no gross restriction of joint movements Skin:  no rashes, no hyperemia Neurological: no tremor with outstretched hands   Diabetic Foot Exam - Simple   Simple Foot Form Diabetic Foot exam was performed with the following findings: Yes 06/07/2023  8:56 AM  Visual Inspection No deformities, no ulcerations, no other skin breakdown bilaterally: Yes Sensation Testing Intact to touch and monofilament testing bilaterally: Yes Pulse Check Posterior Tibialis and Dorsalis pulse intact bilaterally: Yes Comments     CMP ( most recent) CMP     Component Value Date/Time   NA 137 06/05/2023 1004   K 4.2 06/05/2023 1004   CL 100 06/05/2023 1004   CO2 21 06/05/2023 1004   GLUCOSE 212 (H) 06/05/2023 1004   GLUCOSE 211 (H) 05/04/2022 1125   BUN 14 06/05/2023 1004   CREATININE 0.74 06/05/2023 1004   CREATININE 0.77 01/14/2016 1514   CALCIUM 9.8 06/05/2023 1004   PROT 6.7 06/05/2023 1004   ALBUMIN 4.3 06/05/2023 1004   AST 19 06/05/2023 1004   ALT 25 06/05/2023 1004   ALKPHOS 294 (H) 06/05/2023 1004   BILITOT <0.2 06/05/2023 1004   GFRNONAA >60 05/04/2022 1125   GFRNONAA >89 01/14/2016 1514   GFRAA 136 01/29/2020 0926   GFRAA >89 01/14/2016 1514     Diabetic Labs (most recent): Lab Results  Component Value Date   HGBA1C 12.7 (H) 06/05/2023   HGBA1C 12.7 06/05/2023   HGBA1C 11.8 (A) 12/27/2022   MICROALBUR 80 01/01/2021     Lipid Panel ( most recent) Lipid Panel     Component Value Date/Time   CHOL 144 06/05/2023 1004   TRIG 188 (H) 06/05/2023 1004   HDL 33 (L) 06/05/2023 1004   CHOLHDL 4.4 06/05/2023 1004   CHOLHDL 5.1 07/10/2019 0506   VLDL 32 07/10/2019 0506   LDLCALC 79 06/05/2023 1004   LABVLDL 32 06/05/2023 1004       Lab Results  Component Value Date   TSH 0.863 04/18/2022   TSH 3.180 12/31/2020   TSH 2.00 01/14/2016   FREET4 1.07 04/18/2022   FREET4 1.23 12/31/2020           Assessment & Plan:   1) Type 2 diabetes mellitus without complication, without long-term current use of insulin (HCC)  - Alonzo B Katelee Schupp has currently uncontrolled symptomatic type 2 DM since 33 years of age.   Recent labs reviewed.  She presents today with her CGM showing gross hyperglycemia overall.  Her most recent A1c on 3/31 by her PCP was 12.7%.  She has not been using the Cequr device as prescribed at last visit, has never had the training for it.  In the interim, she has been taking Humalog 43-48 units TID when she remembers.  Analysis of her CGM shows TIR 4%, TAR 96% (81% in level 2 hyperglycemia range), TBR 0% with a GMI of 10.9%.  - I had a long discussion with her about the progressive nature of diabetes and the pathology behind its complications.  -She does not currently have any complications from diabetes but she remains at a high risk for more acute and chronic complications which include CAD, CVA, CKD, retinopathy, and neuropathy. These are all discussed in detail with her.  - Nutritional counseling repeated at each appointment due to patients tendency to fall back in to old habits.  - The patient admits there is a room for improvement in their diet and drink choices. -  Suggestion is made for the patient to avoid simple carbohydrates from their diet including Cakes, Sweet Desserts / Pastries, Ice Cream, Soda (diet and regular), Sweet Tea, Candies, Chips, Cookies, Sweet Pastries, Store Bought Juices, Alcohol in Excess of 1-2 drinks a day, Artificial Sweeteners, Coffee Creamer, and "Sugar-free" Products. This will help patient to  have stable blood glucose profile and potentially avoid unintended weight gain.   - I encouraged the patient to switch to unprocessed or minimally processed complex starch and  increased protein intake (animal or plant source), fruits, and vegetables.   - Patient is advised to stick to a routine mealtimes to eat 3 meals a day and avoid unnecessary snacks (to snack only to correct hypoglycemia).  - I have approached her with the following individualized plan to manage  her diabetes and patient agrees:   -She is advised to continue her current dose of Toujeo 120 units SQ nightly, continue Novolog 30-36 units TID with meals if glucose is above 90 and she is eating (Specific instructions on how to titrate insulin dosage based on glucose readings given to patient in writing).  She can also continue Ozempic 2 mg SQ weekly and Metformin 1000 mg ER daily after supper (has a tendency to forget second dose).    -I really encouraged her to try the Cequr device.  I did give her the trainers number and will send the trainer an email as well.  I advised her to take 4 units with a snack, with small meal take 6 units, medium meal take 10 units, and large meal take 14 units.  I anticipate needing to adjust the dosage of this, but need to start small to prevent accidental hypoglycemia.  -she is encouraged to consistently monitor blood glucose 4 times daily (using her CGM), and to call the clinic if she has readings less than 70 or above 300 for 3 tests in a row.  - she is warned not to take insulin without proper monitoring per orders. - Adjustment parameters are given to her for hypo and hyperglycemia in writing.  - Specific targets for  A1c;  LDL, HDL,  and Triglycerides were discussed with the patient.  2) Blood Pressure /Hypertension:  Her blood pressure is controlled to target.  She is advised to continue Amlodipine 10 mg po daily, and Lisinopril 40 mg po daily.   3) Lipids/Hyperlipidemia:    Her most recent lipid panel from 06/05/23 shows controlled LDL of 79 and elevated but improved triglycerides of 188-improving.   She is advised to continue Lipitor 10 mg po daily at bedtime.   Side effects and precautions discussed with her.  She was advised to avoid fried foods and butter.    4)  Weight/Diet:  her Body mass index is 34.94 kg/m.  - clearly complicating her diabetes care.   she is a candidate for weight loss. I discussed with her the fact that loss of 5 - 10% of her  current body weight will have the most impact on her diabetes management.  Exercise, and detailed carbohydrates information provided  -  detailed on discharge instructions.  5) Chronic Care/Health Maintenance: -she is on ACEI/ARB and Statin medications and is encouraged to initiate and continue to follow up with Ophthalmology, Dentist,  Podiatrist at least yearly or according to recommendations, and advised to stay away from smoking. I have recommended yearly flu vaccine and pneumonia vaccine at least every 5 years; moderate intensity exercise for up to 150 minutes weekly; and  sleep for at least 7 hours a day.  - she is advised to maintain close follow up with Tommie Sams, DO for primary care needs, as well as her other providers for optimal and coordinated care.     I spent  45  minutes in the care of the patient today including review  of labs from CMP, Lipids, Thyroid Function, Hematology (current and previous including abstractions from other facilities); face-to-face time discussing  her blood glucose readings/logs, discussing hypoglycemia and hyperglycemia episodes and symptoms, medications doses, her options of short and long term treatment based on the latest standards of care / guidelines;  discussion about incorporating lifestyle medicine;  and documenting the encounter. Risk reduction counseling performed per USPSTF guidelines to reduce obesity and cardiovascular risk factors.     Please refer to Patient Instructions for Blood Glucose Monitoring and Insulin/Medications Dosing Guide"  in media tab for additional information. Please  also refer to " Patient Self Inventory" in the Media  tab for  reviewed elements of pertinent patient history.  Gladstone Lighter Sams participated in the discussions, expressed understanding, and voiced agreement with the above plans.  All questions were answered to her satisfaction. she is encouraged to contact clinic should she have any questions or concerns prior to her return visit.    Follow up plan: - Return in about 4 months (around 10/07/2023) for Diabetes F/U with A1c in office, No previsit labs, Bring meter and logs.  [name], [facility] [address] [place], [place] [address] Phone: [phone] Fax: [phone]  06/07/2023, 9:14 AM

## 2023-06-07 NOTE — Patient Instructions (Signed)
 Avoid the drinks and snacks.  Medications refilled.  Follow up in 6 months.

## 2023-06-08 NOTE — Progress Notes (Signed)
 Subjective:  Patient ID: Melanie Mccoy, female    DOB: 21-Jan-1991  Age: 33 y.o. MRN: [id number]  CC:   Chief Complaint  Patient presents with   Follow-up    6 month f/u labs done recently     HPI:  33 year old female with hepatic steatosis and multiple hepatic adenomas, hypertension, uncontrolled type 2 diabetes, PCOS, obesity, hyperlipidemia presents for follow-up.  Hypertension well-controlled on amlodipine and lisinopril.  Type 2 diabetes remains uncontrolled.  She follows with endocrinology.  Last A1c 12.7.  She has associated microalbuminuria.  Compliance is the main issue.  She states that she often forgets to take her insulin that she takes with meals.  She also notes that she works at [employer] now and has dietary indiscretion as well.  Fair control of lipids.  Most recent LDL 79.  LDL on the prior lipid panel was 60.  She is compliant with Lipitor.   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   Hepatic steatosis 04/25/2022   Mixed hyperlipidemia 04/14/2021   Severe obesity (BMI 35.0-39.9) with comorbidity (HCC) 04/14/2021   Essential hypertension 09/18/2019   Uncontrolled type 2 diabetes mellitus with hyperglycemia (HCC) 01/19/2016   PCOS (polycystic ovarian syndrome) 04/15/2014    Social Hx   Social History   Socioeconomic History   Marital status: Married    Spouse name: Not on file   Number of children: Not on file   Years of education: Not on file   Highest education level: Associate degree: academic program  Occupational History   Not on file  Tobacco Use   Smoking status: Never    Passive exposure: Never   Smokeless tobacco: Never  Vaping Use   Vaping status: Never Used  Substance and Sexual Activity   Alcohol use: No    Comment: Occassionally   Drug use: No   Sexual activity: Yes  Other Topics Concern   Not on file  Social History Narrative   Not on file   Social Drivers of Health   Financial  Resource Strain: Low Risk  (02/16/2023)   Overall Financial Resource Strain (CARDIA)    Difficulty of Paying Living Expenses: Not very hard  Food Insecurity: No Food Insecurity (02/16/2023)   Hunger Vital Sign    Worried About Running Out of Food in the Last Year: Never true    Ran Out of Food in the Last Year: Never true  Transportation Needs: No Transportation Needs (02/16/2023)   PRAPARE - Administrator, Civil Service (Medical): No    Lack of Transportation (Non-Medical): No  Physical Activity: Inactive (02/16/2023)   Exercise Vital Sign    Days of Exercise per Week: 0 days    Minutes of Exercise per Session: 40 min  Stress: Stress Concern Present (02/16/2023)   [employer] of Occupational Health - Occupational Stress Questionnaire    Feeling of Stress : Very much  Social Connections: Socially Integrated (02/16/2023)   Social Connection and Isolation Panel [NHANES]    Frequency of Communication with Friends and Family: Three times a week    Frequency of Social Gatherings with Friends and Family: Never    Attends Religious Services: More than 4 times per year    Active Member of Clubs or Organizations: No    Attends Engineer, structural: 1 to 4 times per year    Marital Status: Married    Review of Systems Per HPI  Objective:  BP 120/80   Pulse 76   Temp 98.4 F (36.9 C)   Ht 5' 3.5" (1.613 m)   Wt 201 lb (91.2 kg)   SpO2 100%   BMI 35.05 kg/m      06/07/2023   11:00 AM 06/07/2023    8:35 AM 04/24/2023    9:22 AM  BP/Weight  Systolic BP 120 120 111  Diastolic BP 80 74 77  Wt. (Lbs) 201 200.4 198  BMI 35.05 kg/m2 34.94 kg/m2 34.52 kg/m2    Physical Exam Vitals and nursing note reviewed.  Constitutional:      General: She is not in acute distress.    Appearance: Normal appearance. She is obese.  HENT:     Head: Normocephalic and atraumatic.  Eyes:     General:        Right eye: No discharge.        Left eye: No discharge.      Conjunctiva/sclera: Conjunctivae normal.  Cardiovascular:     Rate and Rhythm: Normal rate and regular rhythm.  Pulmonary:     Effort: Pulmonary effort is normal.     Breath sounds: Normal breath sounds. No wheezing, rhonchi or rales.  Neurological:     Mental Status: She is alert.  Psychiatric:        Mood and Affect: Mood normal.        Behavior: Behavior normal.     Lab Results  Component Value Date   WBC 12.3 (H) 06/05/2023   HGB 11.8 06/05/2023   HCT 38.4 06/05/2023   PLT 482 (H) 06/05/2023   GLUCOSE 212 (H) 06/05/2023   CHOL 144 06/05/2023   TRIG 188 (H) 06/05/2023   HDL 33 (L) 06/05/2023   LDLCALC 79 06/05/2023   ALT 25 06/05/2023   AST 19 06/05/2023   NA 137 06/05/2023   K 4.2 06/05/2023   CL 100 06/05/2023   CREATININE 0.74 06/05/2023   BUN 14 06/05/2023   CO2 21 06/05/2023   TSH 0.863 04/18/2022   INR 1.0 05/04/2022   HGBA1C 12.7 (H) 06/05/2023   MICROALBUR 80 01/01/2021     Assessment & Plan:  Essential hypertension Assessment & Plan: Stable.  Continue lisinopril and amlodipine.  Orders: -     amLODIPine Besylate; Take 1 tablet (10 mg total) by mouth daily.  Dispense: 90 tablet; Refill: 3 -     Lisinopril; Take 1 tablet (40 mg total) by mouth daily.  Dispense: 90 tablet; Refill: 3  Mixed hyperlipidemia Assessment & Plan: Fair control.  Continue Lipitor.  Orders: -     Atorvastatin Calcium; TAKE 1 TABLET(10 MG) BY MOUTH DAILY  Dispense: 90 tablet; Refill: 3  Uncontrolled type 2 diabetes mellitus with hyperglycemia (HCC) Assessment & Plan: Recommended compliance with medical treatment.  Advised to avoid snacks at work.  Continue close follow-up with endocrinology.     Follow-up: 6 months  Masato Pettie Adriana Simas DO [facility]

## 2023-06-08 NOTE — Assessment & Plan Note (Signed)
 Recommended compliance with medical treatment.  Advised to avoid snacks at work.  Continue close follow-up with endocrinology.

## 2023-06-08 NOTE — Assessment & Plan Note (Signed)
 Fair control.  Continue Lipitor.

## 2023-06-08 NOTE — Assessment & Plan Note (Signed)
Stable.  Continue lisinopril and amlodipine. 

## 2023-06-28 ENCOUNTER — Telehealth: Payer: Self-pay | Admitting: *Deleted

## 2023-06-28 NOTE — Telephone Encounter (Signed)
 Patient left a message that since last night at 12 am , her blood sugars have been running 250's then it plummets to the 40's. She shared that she had watched what she had eaten. She is asking what to do to get it down?  Patient's CGM data was reviewed by [name] Reardon,NP she advised that the patient administer 1/2 half the amount of insulin  at her meal/snack times. To watch for a couple more days then call her back. She would like for patient to do the finger stick to compare to the readings on the CGM. Patient was also ask about her sensor, she states that it has been giving her some issues, but that she has 7 more days left on it. She was advised that if things did not improve with the recommendations that [name] gave to remove sensor apply another one, contact [facility] for a replacement.

## 2023-08-02 ENCOUNTER — Ambulatory Visit: Payer: 59 | Admitting: Family Medicine

## 2023-08-14 ENCOUNTER — Other Ambulatory Visit (HOSPITAL_COMMUNITY): Payer: Self-pay

## 2023-08-14 ENCOUNTER — Telehealth: Payer: Self-pay

## 2023-08-14 NOTE — Telephone Encounter (Signed)
 Pharmacy Patient Advocate Encounter   Received notification from CoverMyMeds that prior authorization for Freestyle libre 3 plus is required/requested.   Insurance verification completed.   The patient is insured through [facility] .   Per test claim: PA required; PA submitted to above mentioned insurance via CoverMyMeds Key/confirmation #/EOC [id number] Status is pending

## 2023-09-04 NOTE — Telephone Encounter (Signed)
 Pharmacy Patient Advocate Encounter  Received notification from [employer] that Prior Authorization for [facility] 3 plus has been APPROVED from 08/17/23 to 08/16/24   PA #/Case ID/Reference #: [id number]

## 2023-09-04 NOTE — Telephone Encounter (Signed)
 Called patient and shared that she had been approved.  Patient shares that she is need of the Cequr having a PA done also. Advised the patient that I would  forward that on to the PA team.

## 2023-09-05 ENCOUNTER — Other Ambulatory Visit (HOSPITAL_COMMUNITY): Payer: Self-pay

## 2023-09-06 ENCOUNTER — Other Ambulatory Visit (HOSPITAL_COMMUNITY): Payer: Self-pay

## 2023-09-06 ENCOUNTER — Telehealth: Payer: Self-pay

## 2023-09-06 NOTE — Telephone Encounter (Signed)
 Pharmacy Patient Advocate Encounter   Received notification from Pt Calls Messages that prior authorization for CeQur Simplicity 2U is required/requested.   Insurance verification completed.   The patient is insured through [facility] .   Per test claim: PA required and submitted KEY/EOC/Request #: [id number] due to This medication or product is on your plan's list of covered drugs. Prior authorization is not required at this time.  30 day copay is $34.95

## 2023-09-06 NOTE — Telephone Encounter (Signed)
 Patient was called and made aware.

## 2023-10-30 ENCOUNTER — Ambulatory Visit: Admitting: Nurse Practitioner

## 2023-11-02 ENCOUNTER — Telehealth: Payer: Self-pay | Admitting: Nurse Practitioner

## 2023-11-02 DIAGNOSIS — E119 Type 2 diabetes mellitus without complications: Secondary | ICD-10-CM

## 2023-11-02 NOTE — Telephone Encounter (Signed)
 Pt is wanting to know if you can refill Toujeo , Humalog , Metformin , semaglutide , she started a new job and can't get off for 11/15/23 appt so offered next available of 02/06/24, pt asked for 02/27/24, pt knows you are out of office until 11/07/23

## 2023-11-07 MED ORDER — TOUJEO MAX SOLOSTAR 300 UNIT/ML ~~LOC~~ SOPN: 120.0000 [IU] | PEN_INJECTOR | Freq: Every day | SUBCUTANEOUS | 3 refills | Status: AC

## 2023-11-07 MED ORDER — INSULIN LISPRO 100 UNIT/ML IJ SOLN: INTRAMUSCULAR | 3 refills | Status: DC

## 2023-11-07 MED ORDER — METFORMIN HCL ER 500 MG PO TB24: 1000.0000 mg | ORAL_TABLET | Freq: Every day | ORAL | 3 refills | Status: AC

## 2023-11-07 MED ORDER — SEMAGLUTIDE (2 MG/DOSE) 8 MG/3ML ~~LOC~~ SOPN: 2.0000 mg | PEN_INJECTOR | SUBCUTANEOUS | 3 refills | Status: DC

## 2023-11-07 NOTE — Telephone Encounter (Signed)
 Yes, I will send in a temporary supply of each of those to hold her until she has her follow up here.  I sent it to [facility] on [address] for her.

## 2023-11-08 ENCOUNTER — Other Ambulatory Visit: Payer: Self-pay | Admitting: Family Medicine

## 2023-11-08 DIAGNOSIS — E782 Mixed hyperlipidemia: Secondary | ICD-10-CM

## 2023-11-10 ENCOUNTER — Telehealth: Admitting: Physician Assistant

## 2023-11-10 ENCOUNTER — Ambulatory Visit: Payer: Self-pay

## 2023-11-10 DIAGNOSIS — A084 Viral intestinal infection, unspecified: Secondary | ICD-10-CM

## 2023-11-10 MED ORDER — ONDANSETRON 4 MG PO TBDP: 4.0000 mg | ORAL_TABLET | Freq: Three times a day (TID) | ORAL | 0 refills | Status: DC | PRN

## 2023-11-10 NOTE — Telephone Encounter (Signed)
 FYI Only or Action Required?: Action required by provider: request for documentation or forms.  Patient was last seen in primary care on 06/07/2023 by Cook, Jayce G, DO.  Called Nurse Triage reporting Vomiting.  Symptoms began today.  Interventions attempted: Rest, hydration, or home remedies.  Symptoms are: stable.  Triage Disposition: Home Care  Patient/caregiver understands and will follow disposition?: Yes Reason for Disposition  MILD to MODERATE vomiting (e.g., 1 - 5 times / day)  Answer Assessment - Initial Assessment Questions Denies dizziness, fever, chest pain, SOB. 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 Advised home care for patient to monitor symptoms. Patient stated she needs a work note - advised patient there is no available appointments today, advised to UC for symptoms and to request a doctors note. Patient still requesting work note from PCP.  1. VOMITING SEVERITY: How many times have you vomited in the past 24 hours?      Vomited 3 times, diarrhea happening throughout  2. ONSET: When did the vomiting begin?      This morning, 7 am  3. FLUIDS: What fluids or food have you vomited up today? Have you been able to keep any fluids down?     Have not tried to eat or drink  4. ABDOMEN PAIN: Are your having any abdomen pain? If Yes : How bad is it and what does it feel like? (e.g., crampy, dull, intermittent, constant)      Nausea and cramping towards the bottom  5. DIARRHEA: Is there any diarrhea? If Yes, ask: How many times today?      Doesn't know   7. CAUSE: What do you think is causing your vomiting?     Unsure  8. HYDRATION STATUS: Any signs of dehydration? (e.g., dry mouth [not only dry lips], too weak to stand) When did you last urinate?     Has not drank anything today  9. OTHER SYMPTOMS: Do you have any other symptoms? (e.g., fever, headache, vertigo, vomiting blood or coffee grounds, recent  head injury)     Headache  Protocols used: Vomiting-A-AH  Copied from CRM #[id number]. Topic: Clinical - Red Word Triage >> Nov 10, 2023  1:04 PM Cleave MATSU wrote: Red Word that prompted transfer to Nurse Triage: pt is having vomiting and diahrrea

## 2023-11-10 NOTE — Telephone Encounter (Signed)
 Scheduled d/t requiring work note.  FYI Only or Action Required?: FYI only for provider.  Patient was last seen in primary care on 06/07/2023 by Cook, Jayce G, DO.  Called Nurse Triage reporting Nausea.  Symptoms began today.  Interventions attempted: Rest, hydration, or home remedies.  Symptoms are: unchanged.  Triage Disposition: No disposition on file.  Patient/caregiver understands and will follow disposition?:  Reason for Disposition  MILD to MODERATE vomiting (e.g., 1 - 5 times / day)  Answer Assessment - Initial Assessment Questions Earlier triage RN sent message to [facility] requesting work note. This RN reached pt and advised that a note would require an assessment from a provider. Scheduled virtual UC visit as no availability with office.  1. VOMITING SEVERITY:      Moderate  2. ONSET: When did the vomiting begin?      This morning at 0630  3. FLUIDS: What fluids or food have you vomited up today? Have you been able to keep any fluids down?     Patient reports can tolerate water  4. ABDOMEN PAIN: Are your having any abdomen pain? If Yes : How bad is it and what does it feel like? (e.g., crampy, dull, intermittent, constant)      Denies, just nausea  5. DIARRHEA: Is there any diarrhea? today?      Yes  6. HYDRATION STATUS: Any signs of dehydration? (e.g., dry mouth [not only dry lips], too weak to stand) When did you last urinate?     Headache  7. PREGNANCY: Is there any chance you are pregnant?        Denies  Protocols used: Vomiting-A-AH Copied from CRM [id number]. Topic: Clinical - Medical Advice >> Nov 10, 2023 12:26 PM Kevelyn M wrote: Reason for CRM: Patient is calling in for a work note. She thinks she has a stomach bug because she's experiencing, vomiting/diarrhea, no fever. She was sent home work. Please advise.   Call back [phone]

## 2023-11-10 NOTE — Telephone Encounter (Signed)
 Spoke with patient and advised visit.  Note not given without visit.

## 2023-11-10 NOTE — Progress Notes (Signed)
 Virtual Visit Consent   Melanie Mccoy, you are scheduled for a virtual visit with a [facility] provider today. Just as with appointments in the office, your consent must be obtained to participate. Your consent will be active for this visit and any virtual visit you may have with one of our providers in the next 365 days. If you have a MyChart account, a copy of this consent can be sent to you electronically.  As this is a virtual visit, video technology does not allow for your provider to perform a traditional examination. This may limit your provider's ability to fully assess your condition. If your provider identifies any concerns that need to be evaluated in person or the need to arrange testing (such as labs, EKG, etc.), we will make arrangements to do so. Although advances in technology are sophisticated, we cannot ensure that it will always work on either your end or our end. If the connection with a video visit is poor, the visit may have to be switched to a telephone visit. With either a video or telephone visit, we are not always able to ensure that we have a secure connection.  By engaging in this virtual visit, you consent to the provision of healthcare and authorize for your insurance to be billed (if applicable) for the services provided during this visit. Depending on your insurance coverage, you may receive a charge related to this service.  I need to obtain your verbal consent now. Are you willing to proceed with your visit today? Melanie Mccoy has provided verbal consent on 11/10/2023 for a virtual visit (video or telephone). Melanie CHRISTELLA Dickinson, PA-C  Date: 11/10/2023 5:36 PM   Virtual Visit via Video Note   I, Melanie Mccoy, connected with  Melanie Mccoy  ([id number], Nov 26, 1990) on 11/10/23 at  5:30 PM EDT by a video-enabled telemedicine application and verified that I am speaking with the correct person using two identifiers.  Location: Patient: Virtual  Visit Location Patient: Home Provider: Virtual Visit Location Provider: Home Office   I discussed the limitations of evaluation and management by telemedicine and the availability of in person appointments. The patient expressed understanding and agreed to proceed.    History of Present Illness: Melanie Mccoy is a 33 y.o. who identifies as a female who was assigned female at birth, and is being seen today for nausea, vomiting, diarrhea.  HPI: Emesis  This is a new problem. The current episode started yesterday. The problem occurs 2 to 4 times per day (has not vomited since 1230). The emesis has an appearance of stomach contents. There has been no fever. Associated symptoms include abdominal pain, diarrhea and headaches. Pertinent negatives include no chills, fever or sweats. 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 She has tried nothing for the symptoms. The treatment provided no relief.     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   Hepatic steatosis 04/25/2022   Mixed hyperlipidemia 04/14/2021   Severe obesity (BMI 35.0-39.9) with comorbidity (HCC) 04/14/2021   Essential hypertension 09/18/2019   Uncontrolled type 2 diabetes mellitus with hyperglycemia (HCC) 01/19/2016   PCOS (polycystic ovarian syndrome) 04/15/2014    Allergies:  Allergies  Allergen Reactions   Bee Venom Swelling    Progressively worse with each sting Other reaction(s): Other   Nickel Rash and Itching   Ibuprofen  Hives   Medications:  Current Outpatient Medications:  ondansetron  (ZOFRAN -ODT) 4 MG disintegrating tablet, Take 1 tablet (4 mg total) by mouth every 8 (eight) hours as needed., Disp: 20 tablet, Rfl: 0   amLODipine  (NORVASC ) 10 MG tablet, Take 1 tablet (10 mg total) by mouth daily., Disp: 90 tablet, Rfl: 3   atorvastatin  (LIPITOR) 10 MG tablet, TAKE 1 TABLET(10 MG) BY MOUTH DAILY, Disp: 90 tablet, Rfl: 3   Continuous Glucose  Sensor (FREESTYLE LIBRE 3 PLUS SENSOR) MISC, Change sensor every 15 days., Disp: 6 each, Rfl: 3   injection device for insulin  (CEQUR SIMPLICITY 2U) DEVI, Change every 2-3 days as directed., Disp: 2 each, Rfl: 6   Injection Device for Insulin  (CEQUR SIMPLICITY INSERTER) MISC, Use to change device every 2-3 days as directed, Disp: 1 each, Rfl: 0   Insulin  Aspart (NOVOLOG  [place]), Inject 43-48 Units into the skin 3 (three) times daily., Disp: , Rfl:    insulin  glargine, 2 Unit Dial , (TOUJEO  MAX SOLOSTAR) 300 UNIT/ML Solostar Pen, Inject 120 Units into the skin daily., Disp: 36 mL, Rfl: 3   insulin  lispro (HUMALOG ) 100 UNIT/ML injection, Use with Cequr device for TDD around 45 units daily., Disp: 80 mL, Rfl: 3   Insulin  Pen Needle (PEN NEEDLES) 31G X 8 MM MISC, Use to inject insulin  4 times daily, Disp: 300 each, Rfl: 6   lisinopril  (ZESTRIL ) 40 MG tablet, Take 1 tablet (40 mg total) by mouth daily., Disp: 90 tablet, Rfl: 3   metFORMIN  (GLUCOPHAGE -XR) 500 MG 24 hr tablet, Take 2 tablets (1,000 mg total) by mouth daily with supper., Disp: 180 tablet, Rfl: 3   Semaglutide , 2 MG/DOSE, 8 MG/3ML SOPN, Inject 2 mg as directed once a week., Disp: 6 mL, Rfl: 3  Observations/Objective: Patient is well-developed, well-nourished in no acute distress.  Resting comfortably at home.  Head is normocephalic, atraumatic.  No labored breathing.  Speech is clear and coherent with logical content.  Patient is alert and oriented at baseline.    Assessment and Plan: 1. Viral gastroenteritis (Primary) - ondansetron  (ZOFRAN -ODT) 4 MG disintegrating tablet; Take 1 tablet (4 mg total) by mouth every 8 (eight) hours as needed.  Dispense: 20 tablet; Refill: 0  - Suspect viral gastroenteritis - Zofran  for nausea - Push fluids, electrolyte beverages - Liquid diet, then increase to soft/bland (BRAT) diet over next day, then increase diet as tolerated - Seek in person evaluation if not improving or symptoms worsen   Follow  Up Instructions: I discussed the assessment and treatment plan with the patient. The patient was provided an opportunity to ask questions and all were answered. The patient agreed with the plan and demonstrated an understanding of the instructions.  A copy of instructions were sent to the patient via MyChart unless otherwise noted below.    The patient was advised to call back or seek an in-person evaluation if the symptoms worsen or if the condition fails to improve as anticipated.    Melanie CHRISTELLA Dickinson, PA-C

## 2023-11-10 NOTE — Patient Instructions (Signed)
 Melanie Mccoy, thank you for joining Delon CHRISTELLA Dickinson, PA-C for today's virtual visit.  While this provider is not your primary care provider (PCP), if your PCP is located in our provider database this encounter information will be shared with them immediately following your visit.   A [facility] MyChart account gives you access to today's visit and all your visits, tests, and labs performed at [facility]  click here if you don't have a [facility] MyChart account or go to [email or web address]  Consent: (Patient) Melanie Mccoy provided verbal consent for this virtual visit at the beginning of the encounter.  Current Medications:  Current Outpatient Medications:    ondansetron  (ZOFRAN -ODT) 4 MG disintegrating tablet, Take 1 tablet (4 mg total) by mouth every 8 (eight) hours as needed., Disp: 20 tablet, Rfl: 0   amLODipine  (NORVASC ) 10 MG tablet, Take 1 tablet (10 mg total) by mouth daily., Disp: 90 tablet, Rfl: 3   atorvastatin  (LIPITOR) 10 MG tablet, TAKE 1 TABLET(10 MG) BY MOUTH DAILY, Disp: 90 tablet, Rfl: 3   Continuous Glucose Sensor (FREESTYLE LIBRE 3 PLUS SENSOR) MISC, Change sensor every 15 days., Disp: 6 each, Rfl: 3   injection device for insulin  (CEQUR SIMPLICITY 2U) DEVI, Change every 2-3 days as directed., Disp: 2 each, Rfl: 6   Injection Device for Insulin  (CEQUR SIMPLICITY INSERTER) MISC, Use to change device every 2-3 days as directed, Disp: 1 each, Rfl: 0   Insulin  Aspart (NOVOLOG  [place]), Inject 43-48 Units into the skin 3 (three) times daily., Disp: , Rfl:    insulin  glargine, 2 Unit Dial , (TOUJEO  MAX SOLOSTAR) 300 UNIT/ML Solostar Pen, Inject 120 Units into the skin daily., Disp: 36 mL, Rfl: 3   insulin  lispro (HUMALOG ) 100 UNIT/ML injection, Use with Cequr device for TDD around 45 units daily., Disp: 80 mL, Rfl: 3   Insulin  Pen Needle (PEN NEEDLES) 31G X 8 MM MISC, Use to inject insulin  4 times daily, Disp: 300 each, Rfl: 6   lisinopril  (ZESTRIL )  40 MG tablet, Take 1 tablet (40 mg total) by mouth daily., Disp: 90 tablet, Rfl: 3   metFORMIN  (GLUCOPHAGE -XR) 500 MG 24 hr tablet, Take 2 tablets (1,000 mg total) by mouth daily with supper., Disp: 180 tablet, Rfl: 3   Semaglutide , 2 MG/DOSE, 8 MG/3ML SOPN, Inject 2 mg as directed once a week., Disp: 6 mL, Rfl: 3   Medications ordered in this encounter:  Meds ordered this encounter  Medications   ondansetron  (ZOFRAN -ODT) 4 MG disintegrating tablet    Sig: Take 1 tablet (4 mg total) by mouth every 8 (eight) hours as needed.    Dispense:  20 tablet    Refill:  0    Supervising Provider:   LAMPTEY, PHILIP O [[id number]]     *If you need refills on other medications prior to your next appointment, please contact your pharmacy*  Follow-Up: Call back or seek an in-person evaluation if the symptoms worsen or if the condition fails to improve as anticipated.  [facility] [phone]  Other Instructions Viral Gastroenteritis, Adult  Viral gastroenteritis is also known as the stomach flu. This condition may affect your stomach, small intestine, and large intestine. It can cause sudden watery diarrhea, fever, and vomiting. This condition is caused by many different viruses. These viruses can be passed from person to person very easily (are contagious). Diarrhea and vomiting can make you feel weak and cause you to become dehydrated. You may not be able to  keep fluids down. Dehydration can make you tired and thirsty, cause you to have a dry mouth, and decrease how often you urinate. It is important to replace the fluids that you lose from diarrhea and vomiting. What are the causes? Gastroenteritis is caused by many viruses, including rotavirus and norovirus. Norovirus is the most common cause in adults. You can get sick after being exposed to the viruses from other people. You can also get sick by: Eating food, drinking water, or touching a surface contaminated with one of these  viruses. Sharing utensils or other personal items with an infected person. What increases the risk? You are more likely to develop this condition if you: Have a weak body defense system (immune system). Live with one or more children who are younger than 2 years. Live in a nursing home. Travel on cruise ships. What are the signs or symptoms? Symptoms of this condition start suddenly 1-3 days after exposure to a virus. Symptoms may last for a few days or for as long as a week. Common symptoms include watery diarrhea and vomiting. Other symptoms include: Fever. Headache. Fatigue. Pain in the abdomen. Chills. Weakness. Nausea. Muscle aches. Loss of appetite. How is this diagnosed? This condition is diagnosed with a medical history and physical exam. You may also have a stool test to check for viruses or other infections. How is this treated? This condition typically goes away on its own. The focus of treatment is to prevent dehydration and restore lost fluids (rehydration). This condition may be treated with: An oral rehydration solution (ORS) to replace important salts and minerals (electrolytes) in your body. Take this if told by your health care provider. This is a drink that is sold at pharmacies and retail stores. Medicines to help with your symptoms. Probiotic supplements to reduce symptoms of diarrhea. Fluids given through an IV, if dehydration is severe. Older adults and people with other diseases or a weak immune system are at higher risk for dehydration. Follow these instructions at home: Eating and drinking  Take an ORS as told by your health care provider. Drink clear fluids in small amounts as you are able. Clear fluids include: Water. Ice chips. Diluted fruit juice. Low-calorie sports drinks. Drink enough fluid to keep your urine pale yellow. Eat small amounts of healthy foods every 3-4 hours as you are able. This may include whole grains, fruits, vegetables, lean  meats, and yogurt. Avoid fluids that contain a lot of sugar or caffeine, such as energy drinks, sports drinks, and soda. Avoid spicy or fatty foods. Avoid alcohol. General instructions  Wash your hands often, especially after having diarrhea or vomiting. If soap and water are not available, use hand sanitizer. Make sure that all people in your household wash their hands well and often. Take over-the-counter and prescription medicines only as told by your health care provider. Rest at home while you recover. Watch your condition for any changes. Take a warm bath to relieve any burning or pain from frequent diarrhea episodes. Keep all follow-up visits. This is important. Contact a health care provider if you: Cannot keep fluids down. Have symptoms that get worse. Have new symptoms. Feel light-headed or dizzy. Have muscle cramps. Get help right away if you: Have chest pain. Have trouble breathing or you are breathing very quickly. Have a fast heartbeat. Feel extremely weak or you faint. Have a severe headache, a stiff neck, or both. Have a rash. Have severe pain, cramping, or bloating in your abdomen. Have skin  that feels cold and clammy. Feel confused. Have pain when you urinate. Have signs of dehydration, such as: Dark urine, very little urine, or no urine. Cracked lips. Dry mouth. Sunken eyes. Sleepiness. Weakness. Have signs of bleeding, such as: Seeing blood in your vomit. Having vomit that looks like coffee grounds. Having bloody or black stools or stools that look like tar. These symptoms may be an emergency. Get help right away. Call 911. Do not wait to see if the symptoms will go away. Do not drive yourself to the hospital. Summary Viral gastroenteritis is also known as the stomach flu. It can cause sudden watery diarrhea, fever, and vomiting. This condition can be passed from person to person very easily (is contagious). Take an oral rehydration solution (ORS) if  told by your health care provider. This is a drink that is sold at pharmacies and retail stores. Wash your hands often, especially after having diarrhea or vomiting. If soap and water are not available, use hand sanitizer. This information is not intended to replace advice given to you by your health care provider. Make sure you discuss any questions you have with your health care provider. Document Revised: 12/21/2020 Document Reviewed: 12/21/2020 Elsevier Patient Education  2024 Elsevier Inc.   If you have been instructed to have an in-person evaluation today at a local Urgent Care facility, please use the link below. It will take you to a list of all of our available [facility], including address, phone number and hours of operation. Please do not delay care.  [facility]  If you or a family member do not have a primary care provider, use the link below to schedule a visit and establish care. When you choose a [facility] primary care physician or advanced practice provider, you gain a long-term partner in health. Find a Primary Care Provider  Learn more about [facility] in-office and virtual care options: [facility] - Get Care Now

## 2023-11-15 ENCOUNTER — Ambulatory Visit: Admitting: Nurse Practitioner

## 2023-11-17 ENCOUNTER — Ambulatory Visit

## 2023-11-17 ENCOUNTER — Other Ambulatory Visit: Payer: Self-pay | Admitting: *Deleted

## 2023-11-17 DIAGNOSIS — Z111 Encounter for screening for respiratory tuberculosis: Secondary | ICD-10-CM

## 2023-11-22 ENCOUNTER — Ambulatory Visit: Payer: Self-pay | Admitting: Nurse Practitioner

## 2023-11-22 ENCOUNTER — Encounter: Payer: Self-pay | Admitting: Physician Assistant

## 2023-11-22 ENCOUNTER — Ambulatory Visit (INDEPENDENT_AMBULATORY_CARE_PROVIDER_SITE_OTHER): Admitting: Physician Assistant

## 2023-11-22 VITALS — BP 118/72 | HR 87 | Temp 97.6°F | Ht 63.5 in | Wt 195.0 lb

## 2023-11-22 DIAGNOSIS — O99810 Abnormal glucose complicating pregnancy: Secondary | ICD-10-CM | POA: Insufficient documentation

## 2023-11-22 DIAGNOSIS — Z0001 Encounter for general adult medical examination with abnormal findings: Secondary | ICD-10-CM

## 2023-11-22 DIAGNOSIS — Z1151 Encounter for screening for human papillomavirus (HPV): Secondary | ICD-10-CM

## 2023-11-22 DIAGNOSIS — E1165 Type 2 diabetes mellitus with hyperglycemia: Secondary | ICD-10-CM | POA: Diagnosis not present

## 2023-11-22 DIAGNOSIS — E782 Mixed hyperlipidemia: Secondary | ICD-10-CM | POA: Diagnosis not present

## 2023-11-22 DIAGNOSIS — Z0000 Encounter for general adult medical examination without abnormal findings: Secondary | ICD-10-CM

## 2023-11-22 DIAGNOSIS — Z124 Encounter for screening for malignant neoplasm of cervix: Secondary | ICD-10-CM

## 2023-11-22 DIAGNOSIS — Z794 Long term (current) use of insulin: Secondary | ICD-10-CM

## 2023-11-22 DIAGNOSIS — I10 Essential (primary) hypertension: Secondary | ICD-10-CM

## 2023-11-22 DIAGNOSIS — R109 Unspecified abdominal pain: Secondary | ICD-10-CM | POA: Insufficient documentation

## 2023-11-22 DIAGNOSIS — J00 Acute nasopharyngitis [common cold]: Secondary | ICD-10-CM | POA: Insufficient documentation

## 2023-11-22 DIAGNOSIS — R300 Dysuria: Secondary | ICD-10-CM | POA: Insufficient documentation

## 2023-11-22 DIAGNOSIS — Z3490 Encounter for supervision of normal pregnancy, unspecified, unspecified trimester: Secondary | ICD-10-CM | POA: Insufficient documentation

## 2023-11-22 LAB — QUANTIFERON-TB GOLD PLUS
QuantiFERON Mitogen Value: >10 [IU]/mL
QuantiFERON Nil Value: 0.04 [IU]/mL
QuantiFERON TB1 Ag Value: 0.02 [IU]/mL
QuantiFERON TB2 Ag Value: 0.03 [IU]/mL
QuantiFERON-TB Gold Plus: NEGATIVE

## 2023-11-22 NOTE — Progress Notes (Signed)
 Complete physical exam  Patient: Melanie Mccoy   DOB: Jun 04, 1990   32 y.o. Female  MRN: [id number]  Subjective:    Chief Complaint  Patient presents with   Annual Exam    Pt declines flu vaccine today. Pt asks when she should start mammograms?    Melanie Mccoy is a 33 y.o. female who presents today for a complete physical exam. She reports consuming a general diet. She is active most days of the week, working 2 jobs. She reports walking 30 minutes per day most days of the week. She generally feels fairly well. She reports sleeping poorly. She does not have additional problems to discuss today.    Most recent fall risk assessment:    11/22/2023    3:48 PM  Fall Risk   Falls in the past year? 0  Number falls in past yr: 0  Injury with Fall? 0  Risk for fall due to : No Fall Risks  Follow up Falls evaluation completed     Most recent depression screenings:    11/22/2023    3:48 PM 06/07/2023   11:06 AM  PHQ 2/9 Scores  PHQ - 2 Score 1 3  PHQ- 9 Score 10 14    Vision:Within last year and Dental: No current dental problems and No regular dental care   Patient Care Team: Cook, Jayce G, DO as PCP - General (Family Medicine) Mallipeddi, Diannah SQUIBB, MD as PCP - Cardiology (Cardiology) Alphonsa Elsie RAMAN, MD (Inactive) (Family Medicine)   Outpatient Medications Prior to Visit  Medication Sig   amLODipine  (NORVASC ) 10 MG tablet Take 1 tablet (10 mg total) by mouth daily.   atorvastatin  (LIPITOR) 10 MG tablet TAKE 1 TABLET(10 MG) BY MOUTH DAILY   Continuous Glucose Sensor (FREESTYLE LIBRE 3 PLUS SENSOR) MISC Change sensor every 15 days.   injection device for insulin  (CEQUR SIMPLICITY 2U) DEVI Change every 2-3 days as directed.   Injection Device for Insulin  (CEQUR SIMPLICITY INSERTER) MISC Use to change device every 2-3 days as directed   Insulin  Aspart (NOVOLOG  [place]) Inject 43-48 Units into the skin 3 (three) times daily.   insulin  glargine, 2 Unit Dial , (TOUJEO  MAX  SOLOSTAR) 300 UNIT/ML Solostar Pen Inject 120 Units into the skin daily.   insulin  lispro (HUMALOG ) 100 UNIT/ML injection Use with Cequr device for TDD around 45 units daily.   Insulin  Pen Needle (PEN NEEDLES) 31G X 8 MM MISC Use to inject insulin  4 times daily   lisinopril  (ZESTRIL ) 40 MG tablet Take 1 tablet (40 mg total) by mouth daily.   metFORMIN  (GLUCOPHAGE -XR) 500 MG 24 hr tablet Take 2 tablets (1,000 mg total) by mouth daily with supper.   ondansetron  (ZOFRAN -ODT) 4 MG disintegrating tablet Take 1 tablet (4 mg total) by mouth every 8 (eight) hours as needed.   Semaglutide , 2 MG/DOSE, 8 MG/3ML SOPN Inject 2 mg as directed once a week.   No facility-administered medications prior to visit.    Review of Systems  Constitutional:  Negative for chills, fever and malaise/fatigue.  Eyes:  Negative for blurred vision and double vision.  Respiratory:  Negative for cough and shortness of breath.   Cardiovascular:  Negative for chest pain and palpitations.  Musculoskeletal:  Negative for joint pain and myalgias.  Neurological:  Negative for dizziness and headaches.       Objective:     BP 118/72   Pulse 87   Temp 97.6 F (36.4 C)   Ht 5'  3.5 (1.613 m)   Wt 195 lb (88.5 kg)   LMP 10/31/2023 (Approximate)   SpO2 96%   BMI 34.00 kg/m   Physical Exam Constitutional:      General: She is not in acute distress.    Appearance: Normal appearance. She is obese. She is not ill-appearing.  HENT:     Head: Normocephalic and atraumatic.     Mouth/Throat:     Mouth: Mucous membranes are moist.     Pharynx: Oropharynx is clear.  Eyes:     Extraocular Movements: Extraocular movements intact.     Conjunctiva/sclera: Conjunctivae normal.  Cardiovascular:     Rate and Rhythm: Normal rate and regular rhythm.     Heart sounds: Normal heart sounds. No murmur heard. Pulmonary:     Effort: Pulmonary effort is normal.     Breath sounds: Normal breath sounds. No wheezing or rales.   Musculoskeletal:     Right lower leg: No edema.     Left lower leg: No edema.  Skin:    General: Skin is warm and dry.  Neurological:     General: No focal deficit present.     Mental Status: She is alert and oriented to person, place, and time.  Psychiatric:        Mood and Affect: Mood normal.        Behavior: Behavior normal.     No results found for any visits on 11/22/23.    Assessment & Plan:    Routine Health Maintenance and Physical Exam  Health Maintenance  Topic Date Due   Hepatitis B Vaccine (1 of 3 - 19+ 3-dose series) Never done   HPV Vaccine (1 - 3-dose SCDM series) Never done   Pap with HPV screening  05/02/2023   Flu Shot  06/04/2024*   DTaP/Tdap/Td vaccine (2 - Td or Tdap) 06/06/2024*   Pneumococcal Vaccine (1 of 2 - PCV) 06/06/2024*   Hemoglobin A1C  12/05/2023   Yearly kidney function blood test for diabetes  06/04/2024   Yearly kidney health urinalysis for diabetes  06/04/2024   Complete foot exam   06/06/2024   Eye exam for diabetics  10/19/2024   Hepatitis C Screening  Completed   HIV Screening  Completed   Meningitis B Vaccine  Aged Out   COVID-19 Vaccine  Discontinued  *Topic was postponed. The date shown is not the original due date.    Discussed health benefits of physical activity, and encouraged her to engage in regular exercise appropriate for her age and condition.  Problem List Items Addressed This Visit     Essential hypertension (Chronic)   Relevant Orders   CMP14+EGFR   CBC with Differential/Platelet   Mixed hyperlipidemia (Chronic)   Relevant Orders   Lipid panel   Diabetes mellitus (HCC)   Relevant Orders   Hemoglobin A1c   Other Visit Diagnoses       Annual visit for general adult medical examination without abnormal findings    -  Primary     Screening for cervical cancer       Relevant Orders   IGP, Aptima HPV     Screening for human papillomavirus (HPV)       Relevant Orders   IGP, Aptima HPV      Return in  about 6 months (around 05/21/2024) for DM, htn, hld.  Safety measures discussed. Immunizations reviewed: declined flu shot today.  Diet and exercise/ lifestyle modifications discussed: Recommend 150 minutes per week of exercise such as  walking. Recommend lots of fresh produce to include fruits, vegetables, beans, healthy fats such as avocado, nuts, seeds, and 3-6 ounces of protein at each meal.  Avoid fried foods and fast food. Limit alcohol consumption: no more than one drink per day for women and 2 drinks per day for men.  Stress management discussed. Routine vision and dental screening discussed: recommend dentist every 6 months, gets vision checked every 1-2 years.  Health maintenance:  Pap performed today.  Questions answered.        Charmaine Celester Lech, PA-C

## 2023-11-23 LAB — CBC WITH DIFFERENTIAL/PLATELET
Basophils Absolute: 0.1 x10E3/uL (ref 0.0–0.2)
Basos: 0 %
EOS (ABSOLUTE): 0.1 x10E3/uL (ref 0.0–0.4)
Eos: 0 %
Hematocrit: 36.5 % (ref 34.0–46.6)
Hemoglobin: 10.9 g/dL — ABNORMAL LOW (ref 11.1–15.9)
Immature Grans (Abs): 0.1 x10E3/uL (ref 0.0–0.1)
Immature Granulocytes: 1 %
Lymphocytes Absolute: 2.1 x10E3/uL (ref 0.7–3.1)
Lymphs: 13 %
MCH: 23.7 pg — ABNORMAL LOW (ref 26.6–33.0)
MCHC: 29.9 g/dL — ABNORMAL LOW (ref 31.5–35.7)
MCV: 80 fL (ref 79–97)
Monocytes Absolute: 1 x10E3/uL — ABNORMAL HIGH (ref 0.1–0.9)
Monocytes: 6 %
Neutrophils Absolute: 13.1 x10E3/uL — ABNORMAL HIGH (ref 1.4–7.0)
Neutrophils: 80 %
Platelets: 448 x10E3/uL (ref 150–450)
RBC: 4.59 x10E6/uL (ref 3.77–5.28)
RDW: 16.7 % — ABNORMAL HIGH (ref 11.7–15.4)
WBC: 16.4 x10E3/uL — ABNORMAL HIGH (ref 3.4–10.8)

## 2023-11-23 LAB — CMP14+EGFR
ALT: 19 IU/L (ref 0–32)
AST: 16 IU/L (ref 0–40)
Albumin: 4.3 g/dL (ref 3.9–4.9)
Alkaline Phosphatase: 360 IU/L — ABNORMAL HIGH (ref 41–116)
BUN/Creatinine Ratio: 16 (ref 9–23)
BUN: 17 mg/dL (ref 6–20)
Bilirubin Total: 0.2 mg/dL (ref 0.0–1.2)
CO2: 22 mmol/L (ref 20–29)
Calcium: 9.9 mg/dL (ref 8.7–10.2)
Chloride: 99 mmol/L (ref 96–106)
Creatinine, Ser: 1.05 mg/dL — ABNORMAL HIGH (ref 0.57–1.00)
Globulin, Total: 2.5 g/dL (ref 1.5–4.5)
Glucose: 268 mg/dL — ABNORMAL HIGH (ref 70–99)
Potassium: 4.4 mmol/L (ref 3.5–5.2)
Sodium: 137 mmol/L (ref 134–144)
Total Protein: 6.8 g/dL (ref 6.0–8.5)
eGFR: 73 mL/min/1.73 (ref >59)

## 2023-11-23 LAB — HEMOGLOBIN A1C
Est. average glucose Bld gHb Est-mCnc: 278 mg/dL
Hgb A1c MFr Bld: 11.3 % — ABNORMAL HIGH (ref 4.8–5.6)

## 2023-11-23 LAB — LIPID PANEL
Chol/HDL Ratio: 4.1 ratio (ref 0.0–4.4)
Cholesterol, Total: 148 mg/dL (ref 100–199)
HDL: 36 mg/dL — ABNORMAL LOW (ref >39)
LDL Chol Calc (NIH): 61 mg/dL (ref 0–99)
Triglycerides: 324 mg/dL — ABNORMAL HIGH (ref 0–149)
VLDL Cholesterol Cal: 51 mg/dL — ABNORMAL HIGH (ref 5–40)

## 2023-11-24 ENCOUNTER — Ambulatory Visit: Payer: Self-pay | Admitting: Physician Assistant

## 2023-11-24 LAB — IGP, APTIMA HPV: HPV Aptima: NEGATIVE

## 2023-11-24 LAB — SPECIMEN STATUS REPORT

## 2023-11-27 ENCOUNTER — Other Ambulatory Visit: Payer: Self-pay | Admitting: Physician Assistant

## 2023-11-27 MED ORDER — FLUCONAZOLE 150 MG PO TABS: 150.0000 mg | ORAL_TABLET | Freq: Once | ORAL | 0 refills | Status: AC

## 2023-12-06 ENCOUNTER — Ambulatory Visit: Admitting: Family Medicine

## 2024-01-16 ENCOUNTER — Encounter (INDEPENDENT_AMBULATORY_CARE_PROVIDER_SITE_OTHER): Admitting: Ophthalmology

## 2024-01-16 DIAGNOSIS — H43813 Vitreous degeneration, bilateral: Secondary | ICD-10-CM

## 2024-01-16 DIAGNOSIS — H35033 Hypertensive retinopathy, bilateral: Secondary | ICD-10-CM

## 2024-01-16 DIAGNOSIS — E113393 Type 2 diabetes mellitus with moderate nonproliferative diabetic retinopathy without macular edema, bilateral: Secondary | ICD-10-CM | POA: Diagnosis not present

## 2024-01-16 DIAGNOSIS — Z794 Long term (current) use of insulin: Secondary | ICD-10-CM | POA: Diagnosis not present

## 2024-01-16 DIAGNOSIS — Z7984 Long term (current) use of oral hypoglycemic drugs: Secondary | ICD-10-CM | POA: Diagnosis not present

## 2024-01-16 DIAGNOSIS — Z7985 Long-term (current) use of injectable non-insulin antidiabetic drugs: Secondary | ICD-10-CM | POA: Diagnosis not present

## 2024-01-16 DIAGNOSIS — I10 Essential (primary) hypertension: Secondary | ICD-10-CM

## 2024-02-27 ENCOUNTER — Encounter: Payer: Self-pay | Admitting: Nurse Practitioner

## 2024-02-27 ENCOUNTER — Ambulatory Visit: Admitting: Nurse Practitioner

## 2024-02-27 VITALS — BP 110/78 | HR 76 | Ht 63.0 in | Wt 194.4 lb

## 2024-02-27 DIAGNOSIS — I10 Essential (primary) hypertension: Secondary | ICD-10-CM | POA: Diagnosis not present

## 2024-02-27 DIAGNOSIS — E1169 Type 2 diabetes mellitus with other specified complication: Secondary | ICD-10-CM

## 2024-02-27 DIAGNOSIS — E119 Type 2 diabetes mellitus without complications: Secondary | ICD-10-CM

## 2024-02-27 DIAGNOSIS — Z794 Long term (current) use of insulin: Secondary | ICD-10-CM

## 2024-02-27 DIAGNOSIS — E785 Hyperlipidemia, unspecified: Secondary | ICD-10-CM

## 2024-02-27 DIAGNOSIS — Z7985 Long-term (current) use of injectable non-insulin antidiabetic drugs: Secondary | ICD-10-CM

## 2024-02-27 DIAGNOSIS — Z7984 Long term (current) use of oral hypoglycemic drugs: Secondary | ICD-10-CM

## 2024-02-27 LAB — POCT GLYCOSYLATED HEMOGLOBIN (HGB A1C): Hemoglobin A1C: 11.7 % — AB (ref 4.0–5.6)

## 2024-02-27 MED ORDER — FREESTYLE LIBRE 3 PLUS SENSOR MISC: 3 refills | Status: AC

## 2024-02-27 MED ORDER — TIRZEPATIDE 7.5 MG/0.5ML ~~LOC~~ SOAJ: 7.5000 mg | SUBCUTANEOUS | 1 refills | Status: AC

## 2024-02-27 NOTE — Progress Notes (Signed)
 "                                                                      Endocrinology Follow Up Visit       02/27/2024, 8:18 AM   Subjective:    Patient ID: Melanie Mccoy, female    DOB: 12-10-1990.  Melanie Mccoy is being seen in follow up after being seen in consultation for management of currently uncontrolled symptomatic diabetes requested by  Cook, Jayce G, DO.   Past Medical History:  Diagnosis Date   Anemia    slightly anemic, iron supplements every 2-3 days   Bartholin cyst    Chlamydia 01/2011   Diabetes mellitus without complication (HCC)    Family history of anesthesia complication    grandmother had spinal with nerve damage   Hepatic adenoma    No pertinent past medical history     Past Surgical History:  Procedure Laterality Date   CESAREAN SECTION N/A 11/22/2012   Procedure: PRIMARY CESAREAN SECTION;  Surgeon: Nena DELENA App, MD;  Location: [facility];  Service: Obstetrics;  Laterality: N/A;   CYST REMOVAL TRUNK  2005    Social History   Socioeconomic History   Marital status: Married    Spouse name: Not on file   Number of children: Not on file   Years of education: Not on file   Highest education level: Associate degree: academic program  Occupational History   Not on file  Tobacco Use   Smoking status: Never    Passive exposure: Never   Smokeless tobacco: Never  Vaping Use   Vaping status: Never Used  Substance and Sexual Activity   Alcohol use: No    Comment: Occassionally   Drug use: No   Sexual activity: Yes    Birth control/protection: None  Other Topics Concern   Not on file  Social History Narrative   Not on file   Social Drivers of Health   Tobacco Use: Low Risk (02/27/2024)   Patient History    Smoking Tobacco Use: Never    Smokeless Tobacco Use: Never    Passive Exposure: Never  Financial Resource Strain: Low Risk (11/18/2023)   Overall Financial Resource Strain (CARDIA)    Difficulty of Paying Living Expenses: Not very  hard  Food Insecurity: No Food Insecurity (11/18/2023)   Epic    Worried About Programme Researcher, Broadcasting/film/video in the Last Year: Never true    Ran Out of Food in the Last Year: Never true  Transportation Needs: No Transportation Needs (11/18/2023)   Epic    Lack of Transportation (Medical): No    Lack of Transportation (Non-Medical): No  Physical Activity: Sufficiently Active (11/18/2023)   Exercise Vital Sign    Days of Exercise per Week: 3 days    Minutes of Exercise per Session: 120 min  Stress: Stress Concern Present (11/18/2023)   [employer] of Occupational Health - Occupational Stress Questionnaire    Feeling of Stress: Very much  Social Connections: Moderately Integrated (11/18/2023)   Social Connection and Isolation Panel    Frequency of Communication with Friends and Family: Twice a week    Frequency of Social Gatherings with Friends and Family: Never    Attends  Religious Services: More than 4 times per year    Active Member of Clubs or Organizations: Yes    Attends Club or Organization Meetings: More than 4 times per year    Marital Status: Married  Depression (PHQ2-9): Medium Risk (11/22/2023)   Depression (PHQ2-9)    PHQ-2 Score: 10  Alcohol Screen: Low Risk (11/18/2023)   Alcohol Screen    Last Alcohol Screening Score (AUDIT): 0  Housing: Low Risk (11/18/2023)   Epic    Unable to Pay for Housing in the Last Year: No    Number of Times Moved in the Last Year: 0    Homeless in the Last Year: No  Utilities: Not on file  Health Literacy: Not on file    Family History  Problem Relation Age of Onset   Cancer Other    Heart disease Other    Diabetes Other    Diabetes Mother    Diabetes Father    Diabetes Maternal Grandfather    Diabetes Paternal Grandmother     Outpatient Encounter Medications as of 02/27/2024  Medication Sig   amLODipine  (NORVASC ) 10 MG tablet Take 1 tablet (10 mg total) by mouth daily.   atorvastatin  (LIPITOR) 10 MG tablet TAKE 1 TABLET(10 MG) BY MOUTH  DAILY   insulin  glargine, 2 Unit Dial , (TOUJEO  MAX SOLOSTAR) 300 UNIT/ML Solostar Pen Inject 120 Units into the skin daily.   Insulin  Pen Needle (PEN NEEDLES) 31G X 8 MM MISC Use to inject insulin  4 times daily   lisinopril  (ZESTRIL ) 40 MG tablet Take 1 tablet (40 mg total) by mouth daily.   metFORMIN  (GLUCOPHAGE -XR) 500 MG 24 hr tablet Take 2 tablets (1,000 mg total) by mouth daily with supper.   tirzepatide  (MOUNJARO ) 7.5 MG/0.5ML Pen Inject 7.5 mg into the skin once a week.   [DISCONTINUED] Continuous Glucose Sensor (FREESTYLE LIBRE 3 PLUS SENSOR) MISC Change sensor every 15 days.   [DISCONTINUED] Semaglutide , 2 MG/DOSE, 8 MG/3ML SOPN Inject 2 mg as directed once a week.   Continuous Glucose Sensor (FREESTYLE LIBRE 3 PLUS SENSOR) MISC Change sensor every 15 days.   [DISCONTINUED] injection device for insulin  (CEQUR SIMPLICITY 2U) DEVI Change every 2-3 days as directed. (Patient not taking: Reported on 02/27/2024)   [DISCONTINUED] Injection Device for Insulin  (CEQUR SIMPLICITY INSERTER) MISC Use to change device every 2-3 days as directed (Patient not taking: Reported on 02/27/2024)   [DISCONTINUED] Insulin  Aspart (NOVOLOG  [place]) Inject 43-48 Units into the skin 3 (three) times daily. (Patient not taking: Reported on 02/27/2024)   [DISCONTINUED] insulin  lispro (HUMALOG ) 100 UNIT/ML injection Use with Cequr device for TDD around 45 units daily. (Patient not taking: Reported on 02/27/2024)   [DISCONTINUED] ondansetron  (ZOFRAN -ODT) 4 MG disintegrating tablet Take 1 tablet (4 mg total) by mouth every 8 (eight) hours as needed.   No facility-administered encounter medications on file as of 02/27/2024.    ALLERGIES: Allergies  Allergen Reactions   Bee Venom Swelling    Progressively worse with each sting Other reaction(s): Other   Nickel Rash and Itching   Ibuprofen  Hives    VACCINATION STATUS: Immunization History  Administered Date(s) Administered   Tdap 06/26/2006    Diabetes She  presents for her follow-up diabetic visit. She has type 2 diabetes mellitus. Onset time: She was diagnosed at approx age of 42. Her disease course has been improving. There are no hypoglycemic associated symptoms. Associated symptoms include fatigue. Pertinent negatives for diabetes include no polydipsia, no polyphagia and no polyuria. There are no hypoglycemic  complications. Symptoms are stable. There are no diabetic complications. Risk factors for coronary artery disease include diabetes mellitus, dyslipidemia, hypertension, obesity, sedentary lifestyle, family history and stress. Current diabetic treatment includes intensive insulin  program and oral agent (monotherapy) (and Ozempic ). She is compliant with treatment some of the time (had a period of time where was out of short acting insulin ). Her weight is stable. She is following a generally unhealthy diet. When asked about meal planning, she reported none. She has had a previous visit with a dietitian. She participates in exercise intermittently. Her home blood glucose trend is decreasing steadily. Her breakfast blood glucose range is generally 140-180 mg/dl. Her lunch blood glucose range is generally >200 mg/dl. Her dinner blood glucose range is generally >200 mg/dl. Her bedtime blood glucose range is generally >200 mg/dl. Her overall blood glucose range is >200 mg/dl. (She presents today with her CGM showing improving, yet still above target glycemic profile overall.  Her POCT A1c today is 11.8%, increasing from last A1c of 11.3%.    Analysis of her CGM shows TIR 25%, TAR 75% (36% in level 2 hyperglycemia range), TBR 0% with a GMI of 8.9%.  She ntoes she has not been taking her Novolog  either injections or using the Cequr device.) An ACE inhibitor/angiotensin II receptor blocker is being taken. She does not see a podiatrist.Eye exam is current.    Review of systems  Constitutional: + Minimally fluctuating body weight,  current Body mass index is 34.44  kg/m. , no fatigue, no subjective hyperthermia, no subjective hypothermia Eyes: no blurry vision, no xerophthalmia ENT: no sore throat, no nodules palpated in throat, no dysphagia/odynophagia, no hoarseness Cardiovascular: no chest pain, no shortness of breath, no palpitations, no leg swelling Respiratory: no cough, no shortness of breath Gastrointestinal: no nausea/vomiting/diarrhea Musculoskeletal: no muscle/joint aches Skin: no rashes, no hyperemia Neurological: no tremors, no numbness, no tingling, no dizziness Psychiatric: no depression, no anxiety  Objective:     BP 110/78 (BP Location: Right Arm, Patient Position: Sitting, Cuff Size: Large)   Pulse 76   Ht 5' 3 (1.6 m)   Wt 194 lb 6.4 oz (88.2 kg)   BMI 34.44 kg/m   Wt Readings from Last 3 Encounters:  02/27/24 194 lb 6.4 oz (88.2 kg)  11/22/23 195 lb (88.5 kg)  06/07/23 201 lb (91.2 kg)    BP Readings from Last 3 Encounters:  02/27/24 110/78  11/22/23 118/72  06/07/23 120/80     Physical Exam- Limited  Constitutional:  Body mass index is 34.44 kg/m. , not in acute distress, normal state of mind Eyes:  EOMI, no exophthalmos Musculoskeletal: no gross deformities, strength intact in all four extremities, no gross restriction of joint movements Skin:  no rashes, no hyperemia Neurological: no tremor with outstretched hands   Diabetic Foot Exam - Simple   No data filed     CMP ( most recent) CMP     Component Value Date/Time   NA 137 11/22/2023 1625   K 4.4 11/22/2023 1625   CL 99 11/22/2023 1625   CO2 22 11/22/2023 1625   GLUCOSE 268 (H) 11/22/2023 1625   GLUCOSE 211 (H) 05/04/2022 1125   BUN 17 11/22/2023 1625   CREATININE 1.05 (H) 11/22/2023 1625   CREATININE 0.77 01/14/2016 1514   CALCIUM  9.9 11/22/2023 1625   PROT 6.8 11/22/2023 1625   ALBUMIN 4.3 11/22/2023 1625   AST 16 11/22/2023 1625   ALT 19 11/22/2023 1625   ALKPHOS 360 (H) 11/22/2023 1625  BILITOT 0.2 11/22/2023 1625   GFRNONAA >60  05/04/2022 1125   GFRNONAA >89 01/14/2016 1514   GFRAA 136 01/29/2020 0926   GFRAA >89 01/14/2016 1514     Diabetic Labs (most recent): Lab Results  Component Value Date   HGBA1C 11.7 (A) 02/27/2024   HGBA1C 11.3 (H) 11/22/2023   HGBA1C 12.7 (H) 06/05/2023   MICROALBUR 80 01/01/2021     Lipid Panel ( most recent) Lipid Panel     Component Value Date/Time   CHOL 148 11/22/2023 1625   TRIG 324 (H) 11/22/2023 1625   HDL 36 (L) 11/22/2023 1625   CHOLHDL 4.1 11/22/2023 1625   CHOLHDL 5.1 07/10/2019 0506   VLDL 32 07/10/2019 0506   LDLCALC 61 11/22/2023 1625   LABVLDL 51 (H) 11/22/2023 1625      Lab Results  Component Value Date   TSH 0.863 04/18/2022   TSH 3.180 12/31/2020   TSH 2.00 01/14/2016   FREET4 1.07 04/18/2022   FREET4 1.23 12/31/2020           Assessment & Plan:   1) Type 2 diabetes mellitus without complication, without long-term current use of insulin  (HCC)  - Nessie B Slade Mccoy has currently uncontrolled symptomatic type 2 DM since 33 years of age.   Recent labs reviewed.  She presents today with her CGM showing improving, yet still above target glycemic profile overall.  Her POCT A1c today is 11.8%, increasing from last A1c of 11.3%.    Analysis of her CGM shows TIR 25%, TAR 75% (36% in level 2 hyperglycemia range), TBR 0% with a GMI of 8.9%.  She ntoes she has not been taking her Novolog  either injections or using the Cequr device.  - I had a long discussion with her about the progressive nature of diabetes and the pathology behind its complications.  -She does not currently have any complications from diabetes but she remains at a high risk for more acute and chronic complications which include CAD, CVA, CKD, retinopathy, and neuropathy. These are all discussed in detail with her.  - Nutritional counseling repeated/built upon at each appointment.  - The patient admits there is a room for improvement in their diet and drink choices. -  Suggestion  is made for the patient to avoid simple carbohydrates from their diet including Cakes, Sweet Desserts / Pastries, Ice Cream, Soda (diet and regular), Sweet Tea, Candies, Chips, Cookies, Sweet Pastries, Store Bought Juices, Alcohol in Excess of 1-2 drinks a day, Artificial Sweeteners, Coffee Creamer, and Sugar-free Products. This will help patient to have stable blood glucose profile and potentially avoid unintended weight gain.   - I encouraged the patient to switch to unprocessed or minimally processed complex starch and increased protein intake (animal or plant source), fruits, and vegetables.   - Patient is advised to stick to a routine mealtimes to eat 3 meals a day and avoid unnecessary snacks (to snack only to correct hypoglycemia).  - I have approached her with the following individualized plan to manage  her diabetes and patient agrees:   -She is advised to continue her current dose of Toujeo  120 units SQ nightly and Metformin  1000 mg ER daily after supper (has a tendency to forget second dose).  Will try changing to Mounjaro  7.5 mg SQ weekly.  She could benefit from GIP therapy.  -she is encouraged to consistently monitor blood glucose 4 times daily (using her CGM), and to call the clinic if she has readings less than 70 or above  300 for 3 tests in a row.  - she is warned not to take insulin  without proper monitoring per orders. - Adjustment parameters are given to her for hypo and hyperglycemia in writing.  - Specific targets for  A1c;  LDL, HDL,  and Triglycerides were discussed with the patient.  2) Blood Pressure /Hypertension: Her blood pressure is controlled to target.  She is advised to continue Amlodipine  10 mg po daily, and Lisinopril  40 mg po daily.   3) Lipids/Hyperlipidemia:    Her most recent lipid panel from 11/22/23 shows controlled LDL of 61 and elevated triglycerides of 324-worsening.   She is advised to continue Lipitor 10 mg po daily at bedtime.  Side effects and  precautions discussed with her.  She was advised to avoid fried foods and butter.    4)  Weight/Diet:  her Body mass index is 34.44 kg/m.  - clearly complicating her diabetes care.   she is a candidate for weight loss. I discussed with her the fact that loss of 5 - 10% of her  current body weight will have the most impact on her diabetes management.  Exercise, and detailed carbohydrates information provided  -  detailed on discharge instructions.  5) Chronic Care/Health Maintenance: -she is on ACEI/ARB and Statin medications and is encouraged to initiate and continue to follow up with Ophthalmology, Dentist,  Podiatrist at least yearly or according to recommendations, and advised to stay away from smoking. I have recommended yearly flu vaccine and pneumonia vaccine at least every 5 years; moderate intensity exercise for up to 150 minutes weekly; and  sleep for at least 7 hours a day.  - she is advised to maintain close follow up with Cook, Jayce G, DO for primary care needs, as well as her other providers for optimal and coordinated care.     I spent  38  minutes in the care of the patient today including review of labs from CMP, Lipids, Thyroid  Function, Hematology (current and previous including abstractions from other facilities); face-to-face time discussing  her blood glucose readings/logs, discussing hypoglycemia and hyperglycemia episodes and symptoms, medications doses, her options of short and long term treatment based on the latest standards of care / guidelines;  discussion about incorporating lifestyle medicine;  and documenting the encounter. Risk reduction counseling performed per USPSTF guidelines to reduce obesity and cardiovascular risk factors.     Please refer to Patient Instructions for Blood Glucose Monitoring and Insulin /Medications Dosing Guide  in media tab for additional information. Please  also refer to  Patient Self Inventory in the Media  tab for reviewed elements of  pertinent patient history.  [place] B Slade Mccoy participated in the discussions, expressed understanding, and voiced agreement with the above plans.  All questions were answered to her satisfaction. she is encouraged to contact clinic should she have any questions or concerns prior to her return visit.    Follow up plan: - Return in about 4 months (around 06/27/2024) for Diabetes F/U with A1c in office, No previsit labs, Bring meter and logs.  Benton Rio, [facility] [address] [place], [place] [address] Phone: [phone] Fax: [phone]  02/27/2024, 8:18 AM    "

## 2024-03-14 ENCOUNTER — Encounter: Payer: Self-pay | Admitting: Nurse Practitioner

## 2024-03-14 NOTE — Telephone Encounter (Signed)
 Patient notes she needs PA for Mounjaro .

## 2024-03-19 ENCOUNTER — Telehealth: Payer: Self-pay | Admitting: *Deleted

## 2024-03-19 NOTE — Telephone Encounter (Signed)
 Patient has left a message that she is needing a PA for her Mounjaro . Pharmacy advised the patient that they had sent this request to us  on 03/11/2024. We have not seen this. She shares that she has been 2 weeks with out the medication.  Patient made aware that we have heard from her and that we are sending this as a urgent request to our RX PA Team. Once we hear from them we will let her know.

## 2024-03-19 NOTE — Telephone Encounter (Signed)
 Patient called back to say that she had called the pharmacy back . They told her that they had sent it to Cover My Meds.

## 2024-03-20 ENCOUNTER — Other Ambulatory Visit: Payer: Self-pay | Admitting: Nurse Practitioner

## 2024-03-20 ENCOUNTER — Telehealth: Payer: Self-pay

## 2024-03-20 ENCOUNTER — Other Ambulatory Visit (HOSPITAL_COMMUNITY): Payer: Self-pay

## 2024-03-20 NOTE — Telephone Encounter (Signed)
 Pharmacy Patient Advocate Encounter   Received notification from Pt Calls Messages that prior authorization for Mounjaro  7.5MG /0.5ML auto-injectors is required/requested.   Insurance verification completed.   The patient is insured through [facility].   Per test claim: PA required; PA submitted to above mentioned insurance via Latent Key/confirmation #/[facility] Status is pending

## 2024-04-09 NOTE — Telephone Encounter (Signed)
 Pharmacy Patient Advocate Encounter  Received notification from [facility] that Prior Authorization for Mounjaro  7.5MG /0.5ML auto-injectors  has been APPROVED from 03/20/2024 to 03/20/2027   PA #/Case ID/Reference #: [id number]

## 2024-04-10 NOTE — Telephone Encounter (Signed)
 Patient was called and made aware.

## 2024-05-27 ENCOUNTER — Ambulatory Visit: Admitting: Family Medicine

## 2024-06-06 ENCOUNTER — Ambulatory Visit: Admitting: Family Medicine

## 2024-06-07 ENCOUNTER — Ambulatory Visit: Admitting: Nurse Practitioner

## 2024-08-09 ENCOUNTER — Encounter (INDEPENDENT_AMBULATORY_CARE_PROVIDER_SITE_OTHER): Admitting: Ophthalmology
